# Patient Record
Sex: Female | Born: 1945
Health system: Southern US, Community
[De-identification: ages and names within clinical notes are randomized; demographics above are authoritative.]

## PROBLEM LIST (undated history)

## (undated) DIAGNOSIS — R058 Other specified cough: Secondary | ICD-10-CM

## (undated) DIAGNOSIS — T8859XA Other complications of anesthesia, initial encounter: Secondary | ICD-10-CM

## (undated) DIAGNOSIS — T4145XA Adverse effect of unspecified anesthetic, initial encounter: Secondary | ICD-10-CM

## (undated) DIAGNOSIS — N63 Unspecified lump in unspecified breast: Secondary | ICD-10-CM

## (undated) DIAGNOSIS — M1990 Unspecified osteoarthritis, unspecified site: Secondary | ICD-10-CM

## (undated) DIAGNOSIS — S060X9A Concussion with loss of consciousness of unspecified duration, initial encounter: Secondary | ICD-10-CM

## (undated) DIAGNOSIS — R05 Cough: Secondary | ICD-10-CM

## (undated) DIAGNOSIS — S060XAA Concussion with loss of consciousness status unknown, initial encounter: Secondary | ICD-10-CM

## (undated) DIAGNOSIS — K649 Unspecified hemorrhoids: Secondary | ICD-10-CM

## (undated) DIAGNOSIS — M810 Age-related osteoporosis without current pathological fracture: Secondary | ICD-10-CM

## (undated) HISTORY — PX: CARPAL TUNNEL RELEASE: SHX101

## (undated) HISTORY — PX: OTHER SURGICAL HISTORY: SHX169

## (undated) HISTORY — PX: KNEE ARTHROSCOPY: SUR90

## (undated) HISTORY — PX: ESOPHAGOGASTRODUODENOSCOPY: SHX1529

## (undated) HISTORY — DX: Age-related osteoporosis without current pathological fracture: M81.0

## (undated) HISTORY — DX: Concussion with loss of consciousness of unspecified duration, initial encounter: S06.0X9A

## (undated) HISTORY — PX: ABDOMINAL HYSTERECTOMY: SHX81

## (undated) HISTORY — PX: ABDOMINAL EXPLORATION SURGERY: SHX538

## (undated) HISTORY — DX: Unspecified lump in unspecified breast: N63.0

## (undated) HISTORY — DX: Concussion with loss of consciousness status unknown, initial encounter: S06.0XAA

## (undated) HISTORY — PX: ROTATOR CUFF REPAIR: SHX139

---

## 1987-02-20 HISTORY — PX: PARTIAL HYSTERECTOMY: SHX80

## 1987-02-20 HISTORY — PX: BREAST SURGERY: SHX581

## 1997-08-17 ENCOUNTER — Other Ambulatory Visit: Admission: RE | Admit: 1997-08-17 | Discharge: 1997-08-17 | Payer: Self-pay | Admitting: Obstetrics and Gynecology

## 1997-12-07 ENCOUNTER — Ambulatory Visit (HOSPITAL_COMMUNITY): Admission: RE | Admit: 1997-12-07 | Discharge: 1997-12-07 | Payer: Self-pay | Admitting: Obstetrics and Gynecology

## 1998-09-05 ENCOUNTER — Other Ambulatory Visit: Admission: RE | Admit: 1998-09-05 | Discharge: 1998-09-05 | Payer: Self-pay | Admitting: Obstetrics and Gynecology

## 1999-10-03 ENCOUNTER — Encounter: Payer: Self-pay | Admitting: Obstetrics and Gynecology

## 1999-10-03 ENCOUNTER — Other Ambulatory Visit: Admission: RE | Admit: 1999-10-03 | Discharge: 1999-10-03 | Payer: Self-pay | Admitting: Obstetrics and Gynecology

## 1999-10-03 ENCOUNTER — Encounter: Admission: RE | Admit: 1999-10-03 | Discharge: 1999-10-03 | Payer: Self-pay | Admitting: Obstetrics and Gynecology

## 2001-02-20 ENCOUNTER — Ambulatory Visit (HOSPITAL_COMMUNITY): Admission: RE | Admit: 2001-02-20 | Discharge: 2001-02-20 | Payer: Self-pay | Admitting: Obstetrics and Gynecology

## 2001-02-20 ENCOUNTER — Encounter: Payer: Self-pay | Admitting: Obstetrics and Gynecology

## 2001-04-09 ENCOUNTER — Observation Stay (HOSPITAL_COMMUNITY): Admission: RE | Admit: 2001-04-09 | Discharge: 2001-04-10 | Payer: Self-pay | Admitting: Orthopedic Surgery

## 2001-04-09 ENCOUNTER — Encounter: Payer: Self-pay | Admitting: Orthopedic Surgery

## 2001-05-20 DIAGNOSIS — N63 Unspecified lump in unspecified breast: Secondary | ICD-10-CM

## 2001-05-20 HISTORY — DX: Unspecified lump in unspecified breast: N63.0

## 2001-08-06 ENCOUNTER — Ambulatory Visit (HOSPITAL_COMMUNITY): Admission: RE | Admit: 2001-08-06 | Discharge: 2001-08-07 | Payer: Self-pay | Admitting: Orthopedic Surgery

## 2002-04-03 ENCOUNTER — Encounter: Payer: Self-pay | Admitting: Obstetrics and Gynecology

## 2002-04-03 ENCOUNTER — Encounter: Admission: RE | Admit: 2002-04-03 | Discharge: 2002-04-03 | Payer: Self-pay | Admitting: Obstetrics and Gynecology

## 2003-02-25 ENCOUNTER — Other Ambulatory Visit: Admission: RE | Admit: 2003-02-25 | Discharge: 2003-02-25 | Payer: Self-pay | Admitting: Internal Medicine

## 2003-06-04 ENCOUNTER — Encounter: Admission: RE | Admit: 2003-06-04 | Discharge: 2003-06-04 | Payer: Self-pay | Admitting: Family Medicine

## 2004-01-10 ENCOUNTER — Ambulatory Visit (HOSPITAL_COMMUNITY): Admission: RE | Admit: 2004-01-10 | Discharge: 2004-01-10 | Payer: Self-pay | Admitting: Gastroenterology

## 2004-04-07 ENCOUNTER — Ambulatory Visit: Payer: Self-pay | Admitting: Family Medicine

## 2004-05-24 ENCOUNTER — Ambulatory Visit: Payer: Self-pay | Admitting: Family Medicine

## 2004-06-27 ENCOUNTER — Ambulatory Visit (HOSPITAL_COMMUNITY): Admission: RE | Admit: 2004-06-27 | Discharge: 2004-06-27 | Payer: Self-pay | Admitting: Gastroenterology

## 2004-07-27 ENCOUNTER — Ambulatory Visit: Payer: Self-pay | Admitting: Family Medicine

## 2004-07-27 ENCOUNTER — Other Ambulatory Visit: Admission: RE | Admit: 2004-07-27 | Discharge: 2004-07-27 | Payer: Self-pay | Admitting: Family Medicine

## 2004-07-27 ENCOUNTER — Encounter (INDEPENDENT_AMBULATORY_CARE_PROVIDER_SITE_OTHER): Payer: Self-pay | Admitting: Internal Medicine

## 2004-07-27 LAB — CONVERTED CEMR LAB: Pap Smear: NORMAL

## 2004-08-09 ENCOUNTER — Encounter: Admission: RE | Admit: 2004-08-09 | Discharge: 2004-08-09 | Payer: Self-pay | Admitting: Family Medicine

## 2004-08-14 ENCOUNTER — Ambulatory Visit: Payer: Self-pay | Admitting: Family Medicine

## 2004-10-05 ENCOUNTER — Encounter (INDEPENDENT_AMBULATORY_CARE_PROVIDER_SITE_OTHER): Payer: Self-pay | Admitting: Specialist

## 2004-10-06 ENCOUNTER — Inpatient Hospital Stay (HOSPITAL_COMMUNITY): Admission: RE | Admit: 2004-10-06 | Discharge: 2004-10-09 | Payer: Self-pay | Admitting: General Surgery

## 2004-10-10 ENCOUNTER — Encounter: Admission: RE | Admit: 2004-10-10 | Discharge: 2004-10-10 | Payer: Self-pay | Admitting: General Surgery

## 2004-10-22 ENCOUNTER — Inpatient Hospital Stay (HOSPITAL_COMMUNITY): Admission: EM | Admit: 2004-10-22 | Discharge: 2004-10-28 | Payer: Self-pay | Admitting: Emergency Medicine

## 2004-11-14 ENCOUNTER — Encounter: Admission: RE | Admit: 2004-11-14 | Discharge: 2004-11-14 | Payer: Self-pay | Admitting: General Surgery

## 2004-11-27 ENCOUNTER — Ambulatory Visit: Payer: Self-pay | Admitting: Family Medicine

## 2004-12-18 ENCOUNTER — Ambulatory Visit: Payer: Self-pay | Admitting: Family Medicine

## 2004-12-22 ENCOUNTER — Ambulatory Visit: Payer: Self-pay | Admitting: Family Medicine

## 2005-07-31 ENCOUNTER — Ambulatory Visit: Payer: Self-pay | Admitting: Family Medicine

## 2005-08-06 ENCOUNTER — Ambulatory Visit: Payer: Self-pay | Admitting: Internal Medicine

## 2005-08-30 ENCOUNTER — Encounter: Admission: RE | Admit: 2005-08-30 | Discharge: 2005-08-30 | Payer: Self-pay | Admitting: Family Medicine

## 2005-11-05 ENCOUNTER — Ambulatory Visit: Payer: Self-pay | Admitting: Family Medicine

## 2005-12-10 ENCOUNTER — Ambulatory Visit: Payer: Self-pay | Admitting: Family Medicine

## 2005-12-26 ENCOUNTER — Ambulatory Visit: Payer: Self-pay | Admitting: Family Medicine

## 2006-04-17 ENCOUNTER — Ambulatory Visit: Payer: Self-pay | Admitting: Family Medicine

## 2006-07-17 ENCOUNTER — Ambulatory Visit: Payer: Self-pay | Admitting: Family Medicine

## 2006-07-17 DIAGNOSIS — J309 Allergic rhinitis, unspecified: Secondary | ICD-10-CM | POA: Insufficient documentation

## 2006-07-17 DIAGNOSIS — M25569 Pain in unspecified knee: Secondary | ICD-10-CM | POA: Insufficient documentation

## 2006-10-11 ENCOUNTER — Encounter: Admission: RE | Admit: 2006-10-11 | Discharge: 2006-10-11 | Payer: Self-pay | Admitting: Family Medicine

## 2006-10-15 ENCOUNTER — Encounter (INDEPENDENT_AMBULATORY_CARE_PROVIDER_SITE_OTHER): Payer: Self-pay | Admitting: *Deleted

## 2006-10-22 ENCOUNTER — Encounter (INDEPENDENT_AMBULATORY_CARE_PROVIDER_SITE_OTHER): Payer: Self-pay | Admitting: *Deleted

## 2006-10-25 ENCOUNTER — Ambulatory Visit: Payer: Self-pay | Admitting: Family Medicine

## 2006-10-25 ENCOUNTER — Telehealth (INDEPENDENT_AMBULATORY_CARE_PROVIDER_SITE_OTHER): Payer: Self-pay | Admitting: *Deleted

## 2006-10-29 ENCOUNTER — Telehealth (INDEPENDENT_AMBULATORY_CARE_PROVIDER_SITE_OTHER): Payer: Self-pay | Admitting: *Deleted

## 2006-11-07 ENCOUNTER — Encounter: Payer: Self-pay | Admitting: Family Medicine

## 2006-11-08 ENCOUNTER — Ambulatory Visit: Payer: Self-pay | Admitting: Family Medicine

## 2006-11-08 ENCOUNTER — Telehealth (INDEPENDENT_AMBULATORY_CARE_PROVIDER_SITE_OTHER): Payer: Self-pay | Admitting: *Deleted

## 2006-11-08 DIAGNOSIS — F411 Generalized anxiety disorder: Secondary | ICD-10-CM | POA: Insufficient documentation

## 2006-11-08 DIAGNOSIS — G4700 Insomnia, unspecified: Secondary | ICD-10-CM

## 2006-12-09 ENCOUNTER — Ambulatory Visit: Payer: Self-pay | Admitting: Family Medicine

## 2006-12-24 ENCOUNTER — Ambulatory Visit: Payer: Self-pay | Admitting: Family Medicine

## 2006-12-24 DIAGNOSIS — G43009 Migraine without aura, not intractable, without status migrainosus: Secondary | ICD-10-CM

## 2006-12-25 LAB — CONVERTED CEMR LAB
ALT: 30 units/L (ref 0–35)
AST: 35 units/L (ref 0–37)
Albumin: 3.9 g/dL (ref 3.5–5.2)
Alkaline Phosphatase: 96 units/L (ref 39–117)
Basophils Absolute: 0 10*3/uL (ref 0.0–0.1)
Chloride: 106 meq/L (ref 96–112)
Creatinine, Ser: 0.7 mg/dL (ref 0.4–1.2)
Direct LDL: 131 mg/dL
HCT: 39.1 % (ref 36.0–46.0)
MCHC: 35.6 g/dL (ref 30.0–36.0)
Neutrophils Relative %: 55.4 % (ref 43.0–77.0)
RBC: 4.37 M/uL (ref 3.87–5.11)
RDW: 12.3 % (ref 11.5–14.6)
Sodium: 140 meq/L (ref 135–145)
Total Bilirubin: 0.8 mg/dL (ref 0.3–1.2)
Total CHOL/HDL Ratio: 3.1
Triglycerides: 74 mg/dL (ref 0–149)
WBC: 4.6 10*3/uL (ref 4.5–10.5)

## 2006-12-29 ENCOUNTER — Encounter (INDEPENDENT_AMBULATORY_CARE_PROVIDER_SITE_OTHER): Payer: Self-pay | Admitting: Internal Medicine

## 2007-01-10 ENCOUNTER — Ambulatory Visit: Payer: Self-pay | Admitting: Internal Medicine

## 2007-01-10 ENCOUNTER — Encounter (INDEPENDENT_AMBULATORY_CARE_PROVIDER_SITE_OTHER): Payer: Self-pay | Admitting: Internal Medicine

## 2007-01-13 ENCOUNTER — Telehealth (INDEPENDENT_AMBULATORY_CARE_PROVIDER_SITE_OTHER): Payer: Self-pay | Admitting: *Deleted

## 2007-01-28 ENCOUNTER — Ambulatory Visit: Payer: Self-pay | Admitting: Family Medicine

## 2007-01-28 DIAGNOSIS — M810 Age-related osteoporosis without current pathological fracture: Secondary | ICD-10-CM

## 2007-01-29 ENCOUNTER — Encounter (INDEPENDENT_AMBULATORY_CARE_PROVIDER_SITE_OTHER): Payer: Self-pay | Admitting: Internal Medicine

## 2007-02-28 ENCOUNTER — Telehealth: Payer: Self-pay | Admitting: Internal Medicine

## 2007-02-28 ENCOUNTER — Encounter (INDEPENDENT_AMBULATORY_CARE_PROVIDER_SITE_OTHER): Payer: Self-pay | Admitting: *Deleted

## 2007-03-06 ENCOUNTER — Telehealth: Payer: Self-pay | Admitting: Family Medicine

## 2007-04-04 ENCOUNTER — Telehealth: Payer: Self-pay | Admitting: Family Medicine

## 2007-04-28 ENCOUNTER — Telehealth (INDEPENDENT_AMBULATORY_CARE_PROVIDER_SITE_OTHER): Payer: Self-pay | Admitting: Internal Medicine

## 2007-04-30 ENCOUNTER — Telehealth (INDEPENDENT_AMBULATORY_CARE_PROVIDER_SITE_OTHER): Payer: Self-pay | Admitting: Internal Medicine

## 2007-05-01 ENCOUNTER — Ambulatory Visit: Payer: Self-pay | Admitting: Family Medicine

## 2007-05-15 ENCOUNTER — Telehealth: Payer: Self-pay | Admitting: Family Medicine

## 2007-07-10 ENCOUNTER — Telehealth (INDEPENDENT_AMBULATORY_CARE_PROVIDER_SITE_OTHER): Payer: Self-pay | Admitting: Internal Medicine

## 2007-08-06 ENCOUNTER — Telehealth: Payer: Self-pay | Admitting: Family Medicine

## 2007-08-15 ENCOUNTER — Ambulatory Visit: Payer: Self-pay | Admitting: Family Medicine

## 2007-08-19 ENCOUNTER — Telehealth: Payer: Self-pay | Admitting: Family Medicine

## 2007-08-21 ENCOUNTER — Telehealth: Payer: Self-pay | Admitting: Family Medicine

## 2007-08-25 ENCOUNTER — Ambulatory Visit: Payer: Self-pay | Admitting: Family Medicine

## 2007-09-18 ENCOUNTER — Ambulatory Visit: Payer: Self-pay | Admitting: Family Medicine

## 2007-11-05 ENCOUNTER — Encounter: Admission: RE | Admit: 2007-11-05 | Discharge: 2007-11-05 | Payer: Self-pay | Admitting: Family Medicine

## 2007-11-05 ENCOUNTER — Ambulatory Visit: Payer: Self-pay | Admitting: Unknown Physician Specialty

## 2007-11-06 ENCOUNTER — Encounter (INDEPENDENT_AMBULATORY_CARE_PROVIDER_SITE_OTHER): Payer: Self-pay | Admitting: *Deleted

## 2007-12-18 ENCOUNTER — Ambulatory Visit: Payer: Self-pay | Admitting: Family Medicine

## 2007-12-18 ENCOUNTER — Other Ambulatory Visit: Admission: RE | Admit: 2007-12-18 | Discharge: 2007-12-18 | Payer: Self-pay | Admitting: Family Medicine

## 2007-12-18 ENCOUNTER — Encounter: Payer: Self-pay | Admitting: Family Medicine

## 2007-12-18 DIAGNOSIS — B351 Tinea unguium: Secondary | ICD-10-CM | POA: Insufficient documentation

## 2007-12-18 DIAGNOSIS — K921 Melena: Secondary | ICD-10-CM | POA: Insufficient documentation

## 2007-12-19 ENCOUNTER — Telehealth (INDEPENDENT_AMBULATORY_CARE_PROVIDER_SITE_OTHER): Payer: Self-pay | Admitting: Internal Medicine

## 2007-12-22 ENCOUNTER — Ambulatory Visit: Payer: Self-pay | Admitting: Gastroenterology

## 2007-12-22 DIAGNOSIS — R195 Other fecal abnormalities: Secondary | ICD-10-CM

## 2007-12-23 LAB — CONVERTED CEMR LAB
Basophils Absolute: 0 10*3/uL (ref 0.0–0.1)
Calcium: 9.4 mg/dL (ref 8.4–10.5)
Eosinophils Absolute: 0.2 10*3/uL (ref 0.0–0.7)
GFR calc Af Amer: 109 mL/min
HCT: 39.7 % (ref 36.0–46.0)
Hemoglobin: 13.8 g/dL (ref 12.0–15.0)
MCHC: 34.7 g/dL (ref 30.0–36.0)
MCV: 91.3 fL (ref 78.0–100.0)
Monocytes Absolute: 0.3 10*3/uL (ref 0.1–1.0)
Neutro Abs: 2.8 10*3/uL (ref 1.4–7.7)
RDW: 12.1 % (ref 11.5–14.6)
Sodium: 142 meq/L (ref 135–145)

## 2007-12-24 ENCOUNTER — Ambulatory Visit: Payer: Self-pay | Admitting: Gastroenterology

## 2007-12-31 ENCOUNTER — Telehealth (INDEPENDENT_AMBULATORY_CARE_PROVIDER_SITE_OTHER): Payer: Self-pay | Admitting: Internal Medicine

## 2008-01-01 ENCOUNTER — Ambulatory Visit: Payer: Self-pay | Admitting: Gastroenterology

## 2008-01-02 ENCOUNTER — Encounter (INDEPENDENT_AMBULATORY_CARE_PROVIDER_SITE_OTHER): Payer: Self-pay | Admitting: Internal Medicine

## 2008-01-02 DIAGNOSIS — J45909 Unspecified asthma, uncomplicated: Secondary | ICD-10-CM | POA: Insufficient documentation

## 2008-01-02 DIAGNOSIS — K219 Gastro-esophageal reflux disease without esophagitis: Secondary | ICD-10-CM | POA: Insufficient documentation

## 2008-01-02 DIAGNOSIS — E785 Hyperlipidemia, unspecified: Secondary | ICD-10-CM | POA: Insufficient documentation

## 2008-01-07 ENCOUNTER — Ambulatory Visit: Payer: Self-pay | Admitting: Gastroenterology

## 2008-01-07 DIAGNOSIS — K5730 Diverticulosis of large intestine without perforation or abscess without bleeding: Secondary | ICD-10-CM | POA: Insufficient documentation

## 2008-01-07 DIAGNOSIS — K649 Unspecified hemorrhoids: Secondary | ICD-10-CM | POA: Insufficient documentation

## 2008-01-27 ENCOUNTER — Encounter (INDEPENDENT_AMBULATORY_CARE_PROVIDER_SITE_OTHER): Payer: Self-pay | Admitting: Internal Medicine

## 2008-02-06 ENCOUNTER — Telehealth (INDEPENDENT_AMBULATORY_CARE_PROVIDER_SITE_OTHER): Payer: Self-pay | Admitting: Internal Medicine

## 2008-03-08 ENCOUNTER — Telehealth: Payer: Self-pay | Admitting: Family Medicine

## 2008-03-09 ENCOUNTER — Ambulatory Visit: Payer: Self-pay | Admitting: Family Medicine

## 2008-04-01 ENCOUNTER — Telehealth: Payer: Self-pay | Admitting: Family Medicine

## 2008-05-13 ENCOUNTER — Telehealth: Payer: Self-pay | Admitting: Family Medicine

## 2008-06-04 ENCOUNTER — Telehealth: Payer: Self-pay | Admitting: Family Medicine

## 2008-06-15 ENCOUNTER — Ambulatory Visit: Payer: Self-pay | Admitting: Family Medicine

## 2008-06-30 ENCOUNTER — Ambulatory Visit: Payer: Self-pay | Admitting: Family Medicine

## 2008-06-30 DIAGNOSIS — B002 Herpesviral gingivostomatitis and pharyngotonsillitis: Secondary | ICD-10-CM

## 2008-07-08 ENCOUNTER — Encounter: Payer: Self-pay | Admitting: Family Medicine

## 2008-07-26 ENCOUNTER — Telehealth: Payer: Self-pay | Admitting: Family Medicine

## 2008-08-10 ENCOUNTER — Telehealth: Payer: Self-pay | Admitting: Family Medicine

## 2008-08-26 ENCOUNTER — Telehealth: Payer: Self-pay | Admitting: Family Medicine

## 2008-11-10 ENCOUNTER — Telehealth: Payer: Self-pay | Admitting: Family Medicine

## 2008-11-16 ENCOUNTER — Encounter: Admission: RE | Admit: 2008-11-16 | Discharge: 2008-11-16 | Payer: Self-pay | Admitting: Family Medicine

## 2008-12-29 ENCOUNTER — Telehealth: Payer: Self-pay | Admitting: Family Medicine

## 2009-02-01 ENCOUNTER — Ambulatory Visit: Payer: Self-pay | Admitting: Family Medicine

## 2009-02-21 ENCOUNTER — Ambulatory Visit: Payer: Self-pay | Admitting: Family Medicine

## 2009-02-21 DIAGNOSIS — R05 Cough: Secondary | ICD-10-CM | POA: Insufficient documentation

## 2009-02-21 DIAGNOSIS — R110 Nausea: Secondary | ICD-10-CM

## 2009-02-22 ENCOUNTER — Telehealth (INDEPENDENT_AMBULATORY_CARE_PROVIDER_SITE_OTHER): Payer: Self-pay | Admitting: Internal Medicine

## 2009-03-11 ENCOUNTER — Ambulatory Visit: Payer: Self-pay | Admitting: Family Medicine

## 2009-03-11 ENCOUNTER — Telehealth: Payer: Self-pay | Admitting: Family Medicine

## 2009-03-11 DIAGNOSIS — R0789 Other chest pain: Secondary | ICD-10-CM | POA: Insufficient documentation

## 2009-03-11 DIAGNOSIS — R002 Palpitations: Secondary | ICD-10-CM | POA: Insufficient documentation

## 2009-03-15 ENCOUNTER — Telehealth: Payer: Self-pay | Admitting: Family Medicine

## 2009-04-06 ENCOUNTER — Encounter: Payer: Self-pay | Admitting: Family Medicine

## 2009-04-06 ENCOUNTER — Ambulatory Visit: Payer: Self-pay | Admitting: Family Medicine

## 2009-04-29 ENCOUNTER — Telehealth: Payer: Self-pay | Admitting: Family Medicine

## 2009-05-03 ENCOUNTER — Ambulatory Visit: Payer: Self-pay | Admitting: Family Medicine

## 2009-05-04 LAB — CONVERTED CEMR LAB: Vit D, 25-Hydroxy: 32 ng/mL (ref 30–89)

## 2009-06-03 ENCOUNTER — Telehealth: Payer: Self-pay | Admitting: Family Medicine

## 2009-07-12 ENCOUNTER — Telehealth: Payer: Self-pay | Admitting: Family Medicine

## 2009-08-15 ENCOUNTER — Telehealth: Payer: Self-pay | Admitting: Family Medicine

## 2009-09-15 ENCOUNTER — Telehealth: Payer: Self-pay | Admitting: Family Medicine

## 2009-09-22 ENCOUNTER — Encounter (INDEPENDENT_AMBULATORY_CARE_PROVIDER_SITE_OTHER): Payer: Self-pay | Admitting: *Deleted

## 2009-10-17 ENCOUNTER — Telehealth: Payer: Self-pay | Admitting: Family Medicine

## 2009-11-16 ENCOUNTER — Encounter: Payer: Self-pay | Admitting: Family Medicine

## 2009-12-02 ENCOUNTER — Telehealth: Payer: Self-pay | Admitting: Family Medicine

## 2009-12-21 ENCOUNTER — Encounter: Admission: RE | Admit: 2009-12-21 | Discharge: 2009-12-21 | Payer: Self-pay | Admitting: Family Medicine

## 2009-12-21 LAB — HM MAMMOGRAPHY

## 2010-01-03 ENCOUNTER — Ambulatory Visit: Payer: Self-pay | Admitting: Family Medicine

## 2010-01-04 ENCOUNTER — Telehealth (INDEPENDENT_AMBULATORY_CARE_PROVIDER_SITE_OTHER): Payer: Self-pay | Admitting: *Deleted

## 2010-01-26 ENCOUNTER — Telehealth: Payer: Self-pay | Admitting: Family Medicine

## 2010-02-02 ENCOUNTER — Ambulatory Visit: Payer: Self-pay | Admitting: Family Medicine

## 2010-02-03 LAB — CONVERTED CEMR LAB
BUN: 23 mg/dL (ref 6–23)
Chloride: 111 meq/L (ref 96–112)
Creatinine, Ser: 0.8 mg/dL (ref 0.4–1.2)
GFR calc non Af Amer: 78.85 mL/min (ref >60.00)
Glucose, Bld: 92 mg/dL (ref 70–99)

## 2010-02-21 ENCOUNTER — Telehealth: Payer: Self-pay | Admitting: Family Medicine

## 2010-02-22 ENCOUNTER — Telehealth: Payer: Self-pay | Admitting: Family Medicine

## 2010-03-01 ENCOUNTER — Ambulatory Visit
Admission: RE | Admit: 2010-03-01 | Discharge: 2010-03-01 | Payer: Self-pay | Source: Home / Self Care | Attending: Orthopedic Surgery | Admitting: Orthopedic Surgery

## 2010-03-06 LAB — POCT HEMOGLOBIN-HEMACUE: Hemoglobin: 15.3 g/dL — ABNORMAL HIGH (ref 12.0–15.0)

## 2010-03-21 NOTE — Assessment & Plan Note (Signed)
Summary: 3:15PRESSURE IN CHEST, SOB, HEART SKIP, COUGH PER DR Carvel Huskins/[place]   Vital Signs:  Patient profile:   65 year old female Height:      62.5 inches Weight:      165 pounds BMI:     29.81 Temp:     98.8 degrees F oral Pulse rate:   96 / minute Pulse rhythm:   regular BP sitting:   100 / 60  (left arm) Cuff size:   regular  Vitals Entered By: Benny Lennert CMA Duncan Dull) (March 11, 2009 3:33 PM)  History of Present Illness: Chief complaint pressure in chest  Seen 1/5 treated for bronchitis with avelox x 5 days.Marland KitchenCXR was negative.  Chest congestion improved.  1 week ago still did not feel 100%. Last night and This AM heart  beating irregularly, chest tightness. The chest tightness is with deep breaths, no change with exertion. Slight dizzyness. Continues to cough, no fever.  No Shortness of breath.   Did take allegra D 9 AM, but was having symtoms the night before.  No further nausea.  Palpitations age 68...no heart eval No stress test.  Hx of anxiety. No family hisotry of CAD.   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) 6)  Hemorrhoids  (ICD-455.6) 7)  Diverticulosis, Mild  (ICD-562.10) 8)  Hyperlipidemia  (ICD-272.4) 9)  Gerd  (ICD-530.81) 10)  Asthma  (ICD-493.90) 11)  Fecal Occult Blood  (ICD-792.1) 12)  Dermatophytosis of Nail  (ICD-110.1) 13)  Hemoccult Positive Stool  (ICD-578.1) 14)  Osteopenia  (ICD-733.90) 15)  Common Migraine  (ICD-346.10) 16)  Osteoporosis  (ICD-733.00) 17)  Well Adult  (ICD-V70.0) 18)  Anxiety State Nos  (ICD-300.00) 19)  Insomnia, Chronic  (ICD-307.42) 20)  Knee Pain, Right  (ICD-719.46) 21)  Allergic Rhinitis  (ICD-477.9)  Current Medications (verified): 1)  Evista 60 Mg Tabs (Raloxifene Hcl) .... Take 1 Tablet By Mouth Once A Day 2)  Zomig  Zmt 5 Mg  Tbdp (Zolmitriptan) .Marland Kitchen.. 1 At Onset of Headache, Can Repeat in 2 Hrs If Not Resolved 3)  Cosamin Ds 500-400 Mg  Tabs (Glucosamine-Chondroitin) .... 2 Tablet S Every Morning 4)  Tussionex Pennkinetic Er 8-10 Mg/58ml Lqcr (Chlorpheniramine-Hydrocodone) .... One Tsp By Mouth At Bedtime As Needed Fort Cough At Night, Sparingly. 5)  Tylenol Arthritis Pain 650 Mg Cr-Tabs (Acetaminophen) .... 2 Tabs By Mouth Two Times A Day 6)  Rhinocort Aqua 32 Mcg/act Susp (Budesonide) .... 2 Sprays Each Nostril Daily 7)  Allegra 180 Mg Tabs (Fexofenadine Hcl) .Marland Kitchen.. 1 Daily By Mouth As Needed 8)  Ibuprofen 200 Mg Tabs (Ibuprofen) .... As Needed  Allergies (verified): 1)  ! * Novacaine 2)  ! Biaxin 3)  ! * Zomig 4)  ! Codeine 5)  ! * Valacyclovir 6)  ! Advair Diskus (Fluticasone-Salmeterol) 7)  Amoxicillin (Amoxicillin) 8)  Erythromycin Ethylsuccinate 9)  Sulfa  Past History:  Past medical, surgical, family and social histories (including risk factors) reviewed, and no changes noted (except as noted below).  Past Medical History: Reviewed history from 01/02/2008 and no changes required. fundoplication Asthma GERD Hyperlipidemia Osteoporosis  Past Surgical History: Reviewed history from 01/27/2008 and no changes required. bone densometry 01/10/2007--osteopenia L/S spine and hip, DEXA 6/03, 7/50 partial hysterectomy--1989--dysplasia, ovaries left in fundoplication 2006 chronic cough Right breast mass- benign (05/2001) Rotator cuff repair- right (  03/2001), left (07/2001) Carpal tunnel release- right (03/2001), left (07/2001) EGD- normal (01/10/2004) Sinus CT- neg colonoscopy   01/07/08--monor diverticulosis, hemorrhoid  Family History: Reviewed history from 12/22/2007 and no changes required. no colon cancer in family  Review of Systems General:  Denies fatigue and fever. CV:  Complains of chest pain or discomfort. Resp:  Denies shortness of breath. GI:  Denies abdominal pain. GU:   Denies dysuria.  Physical Exam  General:  fatigued appearing feamle in NAD Head:  no maxillary sinus ttp Eyes:  Conjunctiva clear bilaterally.  Ears:  External ear exam shows no significant lesions or deformities.  Otoscopic examination reveals clear canals, tympanic membranes are intact bilaterally without bulging, retraction, inflammation or discharge. Hearing is grossly normal bilaterally. Nose:  nasal discharge, mucosal pallor.   Mouth:  MMM Neck:  no carotid bruit or thyromegaly no cervical or supraclavicular lymphadenopathy   Lungs:  Normal respiratory effort, chest expands symmetrically. Lungs are clear to auscultation, no crackles or wheezes. Heart:  Normal rate and regular rhythm. S1 and S2 normal without gallop, murmur, click, rub or other extra sounds. Pulses:  R and L posterior tibial pulses are full and equal bilaterally  Extremities:  no edema    Impression & Recommendations:  Problem # 1:  CHEST DISCOMFORT (ICD-786.59) Non exertional..more pleuriticNo clear respiratory distress...lung exam clear suggesting no PNA. Had neg CXR on 1/5 and s/p avelox x 5 days. Given recent illness and inactivity along with symptoms..doubt PE, but still on differential. No  sign of  ichemia, periocarditis, or PE on EKG  Recommended to pt to go to ER if any SOB on increasing pain. Will get d dimer to risk stratify for need for further eval of PE. Symptoms most liekly due to continued congestion and post inflammatory bronchospasm.  Close follow up on Monday.  Orders: T-D-Dimer Fibrin Derivatives Quantitive [phone]) Specimen Handling (09811) EKG w/ Interpretation (93000)  Problem # 2:  PALPITATIONS (ICD-785.1) ? due to recent medications, cough. EKG today NSR, no arrythmia.  Avoid caffine, decongestants, increase water.   Problem # 3:  COUGH (ICD-786.2) Most likely postinflammatory broncho spasm, PND...improved significantly with recent antibitoics.Marland Kitchenjust persisting mild cough. No SOB.     Complete Medication List: 1)  Evista 60 Mg Tabs (Raloxifene hcl) .... Take 1 tablet by mouth once a day 2)  Zomig Zmt 5 Mg Tbdp (Zolmitriptan) .Marland Kitchen.. 1 at onset of headache, can repeat in 2 hrs if not resolved 3)  Cosamin Ds 500-400 Mg Tabs (Glucosamine-chondroitin) .... 2 tablet s every morning 4)  Tussionex Pennkinetic Er 8-10 Mg/95ml Lqcr (Chlorpheniramine-hydrocodone) .... One tsp by mouth at bedtime as needed fort cough at night, sparingly. 5)  Tylenol Arthritis Pain 650 Mg Cr-tabs (Acetaminophen) .... 2 tabs by mouth two times a day 6)  Rhinocort Aqua 32 Mcg/act Susp (Budesonide) .... 2 sprays each nostril daily 7)  Allegra 180 Mg Tabs (Fexofenadine hcl) .Marland Kitchen.. 1 daily by mouth as needed 8)  Ibuprofen 200 Mg Tabs (Ibuprofen) .... As needed  Patient Instructions: 1)  Rest, push fluid. Stop Allegra. D 2)  Using mucinex for congestion. 3)  Will call with lab results.  4)   Go to ER if any SOB, chest pain increasing.  5)  Follow up appt early next week with Jevonte Clanton.Marland KitchenMarland KitchenTuesday...sooner if needed. Prescriptions: TUSSIONEX PENNKINETIC ER 8-10 MG/5ML LQCR (CHLORPHENIRAMINE-HYDROCODONE) one tsp by mouth at bedtime as needed fort cough at night, sparingly.  #8 oz x 0   Entered and Authorized by:   Kerby Nora  MD   Signed by:   Kerby Nora MD on 03/11/2009   Method used:   Print then Give to Patient   RxID:   [id number]   Current Allergies (reviewed today): ! * NOVACAINE ! BIAXIN ! * ZOMIG ! CODEINE ! * VALACYCLOVIR ! ADVAIR DISKUS (FLUTICASONE-SALMETEROL) AMOXICILLIN (AMOXICILLIN) ERYTHROMYCIN ETHYLSUCCINATE SULFA

## 2010-03-21 NOTE — Progress Notes (Signed)
Summary: Update on patient's migraines/DEXA scan  ---- Converted from flag ---- ---- 01/04/2010 1:44 PM, Crawford Givens MD wrote: Pt has been on evista since 10/15/2001.  I would continue the med for now and recheck DXA in late 2013.  please notify her. Have her update me on her migraines after she has been on the topamax for a few weeks, sooner as needed.  thanks. ------------------------------ Called and left message on machine at home for patient to return call.  Linde Gillis CMA Duncan Dull) January 04, 2010 2:14PM Patient Advised. Delilah Shan CMA Duncan Dull)  January 05, 2010 10:58 AM   Call placed by: Linde Gillis CMA Duncan Dull),  January 04, 2010 2:16 PM

## 2010-03-21 NOTE — Progress Notes (Signed)
Summary: refill request for tussionex  Phone Note Refill Request Call back at Home Phone [phone] Call back at [phone] Message from:  Patient  Refills Requested: Medication #1:  Alice Smith ER 8-10 MG/5ML LQCR one tsp by mouth at bedtime as needed for cough at night Pt has always gotten monthly refills from Dr. Hetty Ely.  She has managed to spread out her last refill until now.  She says if she doesnt take this she will have terrrible couging spells and will not sleep at night- says she only takes this at night.  [facility]  Initial call taken by: Lowella Petties CMA,  October 17, 2009 11:23 AM  Follow-up for Phone Call        please call in.  [facility] ER 8-10 MG/5ML LQCR (CHLORPHENIRAMINE-HYDROCODONE) one tsp by mouth at bedtime as needed for cough at night, sparingly. 8oz, no rf.  Follow-up by: Crawford Givens MD,  October 17, 2009 1:50 PM  Additional Follow-up for Phone Call Additional follow up Details #1::        Medication phoned to pharmacy.  Additional Follow-up by: Delilah Shan CMA Braison Snoke Dull),  October 17, 2009 2:49 PM    Prescriptions: Alice Smith ER 8-10 MG/5ML LQCR (CHLORPHENIRAMINE-HYDROCODONE) one tsp by mouth at bedtime as needed for cough at night, sparingly.  #8 oz x 0   Entered by:   Delilah Shan CMA (AAMA)   Authorized by:   Crawford Givens MD   Signed by:   Delilah Shan CMA (AAMA) on 10/17/2009   Method used:   Telephoned to ...       [facility] [facility]ail)       [address]       [place], [place]  [address]       Ph: [phone] or [phone]       Fax: [phone]   RxID:   [id number]

## 2010-03-21 NOTE — Letter (Signed)
[facility]   Imported By: Lanelle Bal 11/23/2009 11:14:11  _____________________________________________________________________  External Attachment:    Type:   Image     Comment:   External Document  Appended Document: [facility]    Clinical Lists Changes  Observations: Added new observation of PAST MED HX: fundoplication Asthma GERD Hyperlipidemia Osteoporosis migraine- HA eval with Dr. Neale Burly 11/2009 (11/23/2009 22:26)       Past History:  Past Medical History: fundoplication Asthma GERD Hyperlipidemia Osteoporosis migraine- HA eval with Dr. Neale Burly 11/2009

## 2010-03-21 NOTE — Miscellaneous (Signed)
Summary: BONE DENSITY  Clinical Lists Changes  Orders: Added new Test order of T-Bone Densitometry (77080) - Signed Added new Test order of T-Lumbar Vertebral Assessment (77082) - Signed 

## 2010-03-21 NOTE — Progress Notes (Signed)
Summary: refill request for zomig  Phone Note Refill Request Message from:  Fax from Pharmacy  Refills Requested: Medication #1:  ZOMIG ZMT 5 MG  TBDP 1 at onset of headache [facility]ed request from [facility] [address], no last filled date given.  Initial call taken by: Lowella Petties CMA,  September 15, 2009 2:34 PM  Follow-up for Phone Call        sent. notify patient.  Follow-up by: Crawford Givens MD,  September 15, 2009 2:44 PM  Additional Follow-up for Phone Call Additional follow up Details #1::        Patient Advised.  Additional Follow-up by: Delilah Shan CMA (AAMA),  September 15, 2009 3:04 PM    Prescriptions: ZOMIG ZMT 5 MG  TBDP (ZOLMITRIPTAN) 1 at onset of headache, can repeat in 2 hrs if not resolved  #12 Tablet x 1   Entered and Authorized by:   Crawford Givens MD   Signed by:   Crawford Givens MD on 09/15/2009   Method used:   Electronically to        [facility] (retail)       [address]       [place], [place]  [address]       Ph: [phone] or [phone]       Fax: [phone]   RxID:   [id number]

## 2010-03-21 NOTE — Progress Notes (Signed)
Summary: tussionex   Phone Note Refill Request Call back at Home Phone [phone] Message from:  Patient on August 15, 2009 2:41 PM  Refills Requested: Medication #1:  Sandria Senter ER 8-10 MG/5ML LQCR one tsp by mouth at bedtime as needed fort cough at [facility]  Initial call taken by: Melody Comas,  August 15, 2009 2:41 PM  Follow-up for Phone Call        Rx called to pharmacy. Patient notified. Follow-up by: Sydell Axon LPN,  August 15, 2009 4:42 PM    New/Updated Medications: Sandria Senter ER 8-10 MG/5ML LQCR (CHLORPHENIRAMINE-HYDROCODONE) one tsp by mouth at bedtime as needed for cough at night, sparingly. Prescriptions: TUSSIONEX PENNKINETIC ER 8-10 MG/5ML LQCR (CHLORPHENIRAMINE-HYDROCODONE) one tsp by mouth at bedtime as needed for cough at night, sparingly.  #8 oz x 0   Entered and Authorized by:   Shaune Leeks MD   Signed by:   Shaune Leeks MD on 08/15/2009   Method used:   Telephoned to ...       [facility] (retail)       [address]       [place], [place]  [address]       Ph: [phone] or [phone]       Fax: [phone]   RxID:   [id number]

## 2010-03-21 NOTE — Letter (Signed)
Summary: Nadara Eaton letter  [place] [facility]  [address] [place], [place] [address]   Phone: [phone]  Fax: [phone]       09/22/2009 MRN: [id number]  MERLEAN PIZZINI [address] [place], [place]  [address]  Dear Ms. [facility] [place] and [place] announce the retirement of Arta Silence, M.D., from full-time practice at the [facility] office effective August 18, 2009 and his plans of returning part-time.  It is important to Dr. Hetty Ely and to our practice that you understand that [facility] has seven physicians in our office for your health care needs.  We will continue to offer the same exceptional care that you have today.    Dr. Hetty Ely has spoken to many of you about his plans for retirement and returning part-time in the fall.   We will continue to work with you through the transition to schedule appointments for you in the office and meet the high standards that [place] is committed to.   Again, it is with great pleasure that we share the news that Dr. Hetty Ely will return to [facility] in October of 2011 with a reduced schedule.    If you have any questions, or would like to request an appointment with one of our physicians, please call us at [phone] and press the option for Scheduling an appointment.  We take pleasure in providing you with excellent patient care and look forward to seeing you at your next office visit.  Our [facility] Physicians are:  Tillman Abide, M.D. Laurita Quint, M.D. Roxy Manns, M.D. Kerby Nora, M.D. Hannah Beat, M.D. Ruthe Mannan, M.D. We proudly welcomed Raechel Ache, M.D. and Eustaquio Boyden, M.D. to the practice in July/August 2011.  Sincerely,  [facility]

## 2010-03-21 NOTE — Assessment & Plan Note (Signed)
Summary: CONGESTION/DLO   Vital Signs:  Patient profile:   65 year old female Height:      62.5 inches Weight:      165.2 pounds BMI:     29.84 Temp:     99.0 degrees F oral Pulse rate:   96 / minute Pulse rhythm:   regular BP sitting:   110 / 70  (left arm) Cuff size:   regular  Vitals Entered By: Benny Lennert CMA Duncan Dull) (February 21, 2009 3:25 PM)  History of Present Illness: Chief complaint congestion   Acute Visit History:      The patient complains of abdominal pain, cough, fever, nasal discharge, and sinus problems.  These symptoms began 2 weeks ago.  She denies earache, genitourinary symptoms, and headache.  Other comments include: fatigue, nausea Copius post nasal srip.  Has been exposed to flu.        Her highest temperature has been subjective.        The character of the cough is described as nonproductive.  There is no history of wheezing or shortness of breath associated with her cough.        She complains of sinus pressure, ears being blocked, and nasal congestion.        Comments: central.        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) 4)  Hemorrhoids  (ICD-455.6) 5)  Diverticulosis, Mild  (ICD-562.10) 6)  Hyperlipidemia  (ICD-272.4) 7)  Gerd  (ICD-530.81) 8)  Asthma  (ICD-493.90) 9)  Fecal Occult Blood  (ICD-792.1) 10)  Dermatophytosis of Nail  (ICD-110.1) 11)  Hemoccult Positive Stool  (ICD-578.1) 12)  Osteopenia  (ICD-733.90) 13)  Common Migraine  (ICD-346.10) 14)  Osteoporosis  (ICD-733.00) 15)  Well Adult  (ICD-V70.0) 16)  Anxiety State Nos  (ICD-300.00) 17)  Insomnia, Chronic  (ICD-307.42) 18)  Knee Pain, Right  (ICD-719.46) 19)  Allergic Rhinitis  (ICD-477.9)  Current Medications (verified): 1)  Evista 60 Mg Tabs  (Raloxifene Hcl) .... Take 1 Tablet By Mouth Once A Day 2)  Zomig Zmt 5 Mg  Tbdp (Zolmitriptan) .Marland Kitchen.. 1 At Onset of Headache, Can Repeat in 2 Hrs If Not Resolved 3)  Cosamin Ds 500-400 Mg  Tabs (Glucosamine-Chondroitin) .... 2 Tablet S Every Morning 4)  Tussionex Pennkinetic Er 8-10 Mg/57ml Lqcr (Chlorpheniramine-Hydrocodone) .... One Tsp By Mouth At Bedtime As Needed Fort Cough At Night, Sparingly. 5)  Tylenol Arthritis Pain 650 Mg Cr-Tabs (Acetaminophen) .... 2 Tabs By Mouth Two Times A Day 6)  Rhinocort Aqua 32 Mcg/act Susp (Budesonide) .... 2 Sprays Each Nostril Daily 7)  Allegra 180 Mg Tabs (Fexofenadine Hcl) .Marland Kitchen.. 1 Daily By Mouth As Needed 8)  Ibuprofen 200 Mg Tabs (Ibuprofen) .... As Needed 9)  Avelox 400 Mg Tabs (Moxifloxacin Hcl) .Marland Kitchen.. 1 Tab By Mouth Daily X 5 Days 10)  Promethazine Hcl 25 Mg Tabs (Promethazine Hcl) .... 1/2 To 1 Tab By Mouth Q6 Hours As Needed For Nausea  Allergies: 1)  ! * Novacaine 2)  ! Biaxin 3)  ! * Zomig 4)  ! Codeine 5)  ! * Valacyclovir 6)  ! Advair Diskus (Fluticasone-Salmeterol) 7)  Amoxicillin (Amoxicillin) 8)  Erythromycin Ethylsuccinate 9)  Sulfa  Past History:  Past medical, surgical, family and social histories (including risk factors) reviewed,  and no changes noted (except as noted below).  Past Medical History: Reviewed history from 01/02/2008 and no changes required. fundoplication Asthma GERD Hyperlipidemia Osteoporosis  Past Surgical History: Reviewed history from 01/27/2008 and no changes required. bone densometry 01/10/2007--osteopenia L/S spine and hip, DEXA 6/03, 7/50 partial hysterectomy--1989--dysplasia, ovaries left in fundoplication 2006 chronic cough Right breast mass- benign (05/2001) Rotator cuff repair- right (03/2001), left (07/2001) Carpal tunnel release- right (03/2001), left (07/2001) EGD- normal (01/10/2004) Sinus CT- neg colonoscopy   01/07/08--monor diverticulosis, hemorrhoid  Family History: Reviewed history  from 12/22/2007 and no changes required. no colon cancer in family  Social History: Reviewed history from 12/22/2007 and no changes required. Marital Status: Married Children:3 --adult--has adopted 4, to have new grand child soon--has 1 Occupation: at home  Former Smoker 10 pack year history  Review of Systems General:  Complains of fatigue. Eyes:  Denies eye pain. ENT:  Denies ringing in ears. CV:  Denies chest pain or discomfort and swelling of feet. GI:  Complains of constipation; denies abdominal pain and diarrhea; but had nml BM this AM. GU:  Denies dysuria.  Physical Exam  General:  fatigued appearing feamle in NAD Head:  no maxillar sinus ttp Nose:  nasal dischargemucosal pallor.   Mouth:  MMM Neck:  no carotid bruit or thyromegaly .nnodes  Lungs:  rhonichiin left lung, cleared some with cough, no rales Heart:  Normal rate and regular rhythm. S1 and S2 normal without gallop, murmur, click, rub or other extra sounds.   Impression & Recommendations:  Problem # 1:  COUGH (ICD-786.2) Likely bronchitis vs influenza, but lung exam and SOB concerning for pneumonia. Send for CXR ASAP.  No hypoxia.  Will treat with antibiotics avelox 5 days...can extend course  if CXR positive. Treat cough with suppressant.. start mucinex for mucolytic.  Orders: Radiology Referral (Radiology)  Problem # 2:  NAUSEA (ICD-787.02) ? due to PND. Use phenergan as needed. No suggestion of UTI etc.  Her updated medication list for this problem includes:    Allegra 180 Mg Tabs (Fexofenadine hcl) .Marland Kitchen... 1 daily by mouth as needed    Promethazine Hcl 25 Mg Tabs (Promethazine hcl) .Marland Kitchen... 1/2 to 1 tab by mouth q6 hours as needed for nausea  Complete Medication List: 1)  Evista 60 Mg Tabs (Raloxifene hcl) .... Take 1 tablet by mouth once a day 2)  Zomig Zmt 5 Mg Tbdp (Zolmitriptan) .Marland Kitchen.. 1 at onset of headache, can repeat in 2 hrs if not resolved 3)  Cosamin Ds 500-400 Mg Tabs (Glucosamine-chondroitin)  .... 2 tablet s every morning 4)  Tussionex Pennkinetic Er 8-10 Mg/67ml Lqcr (Chlorpheniramine-hydrocodone) .... One tsp by mouth at bedtime as needed fort cough at night, sparingly. 5)  Tylenol Arthritis Pain 650 Mg Cr-tabs (Acetaminophen) .... 2 tabs by mouth two times a day 6)  Rhinocort Aqua 32 Mcg/act Susp (Budesonide) .... 2 sprays each nostril daily 7)  Allegra 180 Mg Tabs (Fexofenadine hcl) .Marland Kitchen.. 1 daily by mouth as needed 8)  Ibuprofen 200 Mg Tabs (Ibuprofen) .... As needed 9)  Avelox 400 Mg Tabs (Moxifloxacin hcl) .Marland Kitchen.. 1 tab by mouth daily x 5 days 10)  Promethazine Hcl 25 Mg Tabs (Promethazine hcl) .... 1/2 to 1 tab by mouth q6 hours as needed for nausea  Patient Instructions: 1)  Referral Appointment Information 2)  Day/Date: 3)  Time: 4)  Place/MD: 5)  Address: 6)  Phone/Fax: 7)  Patient given appointment information. Information/Orders faxed/mailed. 8)  Call if not improving in 48-72 hpurs.Marland Kitchengo  to ER if SOB. 9)  Neurology appt with Dr Clarisse Gouge on 02/28/2009.... ask Gae about phone number for their office. Prescriptions: PROMETHAZINE HCL 25 MG TABS (PROMETHAZINE HCL) 1/2 to 1 tab by mouth q6 hours as needed for nausea  #20 x 0   Entered and Authorized by:   Kerby Nora MD   Signed by:   Kerby Nora MD on 02/21/2009   Method used:   [facility]        [id number]       [address]       [place], [place]  [address]       Ph: [phone] or [phone]       Fax: [phone]   RxID:   [id number] AVELOX 400 MG TABS (MOXIFLOXACIN HCL) 1 tab by mouth daily x 5 days  #5 x 0   Entered and Authorized by:   Kerby Nora MD   Signed by:   Kerby Nora MD on 02/21/2009   Method used:   [facility]        [facility] (retail)       [address]       [place], [place]  [address]       Ph: [phone] or [phone]       Fax: [phone]   RxID:   [id number]   Current Allergies (reviewed today): ! *  NOVACAINE ! BIAXIN ! * ZOMIG ! CODEINE ! * VALACYCLOVIR ! ADVAIR DISKUS (FLUTICASONE-SALMETEROL) AMOXICILLIN (AMOXICILLIN) ERYTHROMYCIN ETHYLSUCCINATE SULFA  Appended Document: CONGESTION/DLO Notify pt call report of CXR was clear..no pneumonia or other..take antibiotic for brponchitis as directed.   Appended Document: CONGESTION/DLO patients husband advised.Consuello Masse CMA

## 2010-03-21 NOTE — Assessment & Plan Note (Signed)
Summary: REFILL MED/DR [facility] PT/CLE   Vital Signs:  Patient profile:   65 year old female Height:      62.5 inches Weight:      171.25 pounds BMI:     30.93 Temp:     98.8 degrees F oral Pulse rate:   88 / minute Pulse rhythm:   regular BP sitting:   132 / 80  (left arm) Cuff size:   regular  Vitals Entered By: Delilah Shan CMA Duncan Dull) (January 03, 2010 3:20 PM) CC: Refill medication, Preventive Care   History of Present Illness: Osteopenia:  last dxa was last year, h/o osteopenia,  Has been on evista since 09/2001.  Is s/p hysterectomy.    cough- using tussionex.  This is the only thing that helps the patient.  Had allergy shots at allergy clinic.  H/o nissen fundoplication.  Prev with extensive work up including EGD.  No sputum.  Needs refill.   HA- Posterior occiput pain.  Waking with pain.  Can happen later in the day.  H/o vision change with HA, none recently.  zomig helps.  Usually with 50% of days or greater.  Photo and phonophobia.  Dry heaves.  Hasn't started on suppressive tx yet.     H/o knee pain. L knee, prev improved with exercises to strengthen quad.  Asking for advice.  Prev medial meniscus findings.   Current Medications (verified): 1)  Evista 60 Mg Tabs (Raloxifene Hcl) .... Take 1 Tablet By Mouth Once A Day 2)  Zomig Zmt 5 Mg  Tbdp (Zolmitriptan) .Marland Kitchen.. 1 At Onset of Headache, Can Repeat in 2 Hrs If Not Resolved 3)  Cosamin Ds 500-400 Mg  Tabs (Glucosamine-Chondroitin) .... 2 Tablet S Every Morning 4)  Tussionex Pennkinetic Er 8-10 Mg/39ml Lqcr (Chlorpheniramine-Hydrocodone) .... One Tsp By Mouth At Bedtime As Needed For Cough At Night, Sparingly. 5)  Allegra 180 Mg Tabs (Fexofenadine Hcl) .Marland Kitchen.. 1 Daily By Mouth As Needed 6)  Ibuprofen 200 Mg Tabs (Ibuprofen) .... As Needed 7)  Vitamin D 1000 Unit Tabs (Cholecalciferol) .... Take One By Mouth Daily 8)  Triamcinolone Nasal Spray .... One Spray Each Nostril Once Daily 9)  Vitamin C 500 Mg  Tabs (Ascorbic Acid)  .... Once Daily 10)  Vitamin B Complex-C   Caps (B Complex-C) .... Once Daily 11)  Cvs Biotin 5000 Mcg Caps (Biotin) .... Take 1 Capsule By Mouth Once A Day 12)  Calcium Carbonate-Vitamin D 600-400 Mg-Unit  Tabs (Calcium Carbonate-Vitamin D) .... Take 1 or 2 Once Daily 13)  Fish Oil   Oil (Fish Oil) .... 1,000 Mg. Once Daily  Allergies: 1)  ! * Novacaine 2)  ! Biaxin 3)  ! * Zomig 4)  ! Codeine 5)  ! * Valacyclovir 6)  ! Advair Diskus (Fluticasone-Salmeterol) 7)  Amoxicillin (Amoxicillin) 8)  Erythromycin Ethylsuccinate 9)  Sulfa  Past History:  Family History: Last updated: 01/03/2010 no colon cancer in family M alive, h/o frequent cough, OA F dead, cancer- throat sister with breast CA GM died with breast CA  Social History: Last updated: 01/03/2010 Marital Status: Married, 1980 (second marriage) Children:3 --adult--has adopted 4, 2 grandkids Occupation: at home Former Smoker 10 pack year history alcohol: none From [place] enjoys time with family, has a treadmill but hasn't used it much  Past Medical History: Asthma GERD s/p fundoplication Hyperlipidemia Osteoporosis, last DXA 2010 migraine- HA eval with Dr. Neale Burly 11/2009  Past Surgical History: bone densometry 01/10/2007--osteopenia L/S spine and hip, DEXA 6/03, 7/50  hysterectomy--1989--dysplasia, ovaries left in fundoplication 2006 chronic cough Right breast mass- benign (05/2001) Rotator cuff repair- right (03/2001), left (07/2001) Carpal tunnel release- right (03/2001), left (07/2001) EGD- normal (01/10/2004) Sinus CT- neg colonoscopy   01/07/08--monor diverticulosis, hemorrhoid  Family History: no colon cancer in family M alive, h/o frequent cough, OA F dead, cancer- throat sister with breast CA GM died with breast CA  Social History: Marital Status: Married, 1980 (second marriage) Children:3 --adult--has adopted 4, 2 grandkids Occupation: at home Former Smoker 10 pack year history alcohol:  none From [place] enjoys time with family, has a treadmill but hasn't used it much  Review of Systems       See HPI.  Otherwise negative.    Physical Exam  General:  GEN: nad, alert and oriented HEENT: mucous membranes moist NECK: supple w/o LA CV: rrr.  no murmur PULM: ctab, no inc wob ABD: soft, +bs EXT: no edema SKIN: no acute rash  CN 2-12 wnl B, S/S/DTR wnl x4  L knee w/o effusion but medial meniscal findings noted on exam- she is tender medially with testing.  able to bear weight.    Impression & Recommendations:  Problem # 1:  COUGH (ICD-786.2) cont tussionex.   Problem # 2:  COMMON MIGRAINE (ICD-346.10) d/w patient about using topamax and gradually increasing the dose.  She is aware of possible tingling in the hands.  follow up as needed and call back with update as needed.  She agrees.  The following medications were removed from the medication list:    Tylenol Arthritis Pain 650 Mg Cr-tabs (Acetaminophen) .Marland Kitchen... 2 tabs by mouth two times a day Her updated medication list for this problem includes:    Zomig Zmt 5 Mg Tbdp (Zolmitriptan) .Marland Kitchen... 1 at onset of headache, can repeat in 2 hrs if not resolved    Ibuprofen 200 Mg Tabs (Ibuprofen) .Marland Kitchen... As needed  Problem # 3:  OSTEOPENIA (ICD-733.90) I would continue meds for now and recheck DXA in 2012 or 2013.   Her updated medication list for this problem includes:    Evista 60 Mg Tabs (Raloxifene hcl) .Marland Kitchen... Take 1 tablet by mouth once a day    Vitamin D 1000 Unit Tabs (Cholecalciferol) .Marland Kitchen... Take one by mouth daily    Calcium Carbonate-vitamin D 600-400 Mg-unit Tabs (Calcium carbonate-vitamin d) .Marland Kitchen... Take 1 or 2 once daily  Complete Medication List: 1)  Evista 60 Mg Tabs (Raloxifene hcl) .... Take 1 tablet by mouth once a day 2)  Zomig Zmt 5 Mg Tbdp (Zolmitriptan) .Marland Kitchen.. 1 at onset of headache, can repeat in 2 hrs if not resolved 3)  Cosamin Ds 500-400 Mg Tabs (Glucosamine-chondroitin) .... 2 tablet s every  morning 4)  Tussionex Pennkinetic Er 8-10 Mg/28ml Lqcr (Chlorpheniramine-hydrocodone) .... One tsp by mouth at bedtime as needed for cough at night, sparingly. 5)  Allegra 180 Mg Tabs (Fexofenadine hcl) .Marland Kitchen.. 1 daily by mouth as needed 6)  Ibuprofen 200 Mg Tabs (Ibuprofen) .... As needed 7)  Vitamin D 1000 Unit Tabs (Cholecalciferol) .... Take one by mouth daily 8)  Triamcinolone Nasal Spray  .... One spray each nostril once daily 9)  Vitamin C 500 Mg Tabs (Ascorbic acid) .... Once daily 10)  Vitamin B Complex-c Caps (B complex-c) .... Once daily 11)  Cvs Biotin 5000 Mcg Caps (Biotin) .... Take 1 capsule by mouth once a day 12)  Calcium Carbonate-vitamin D 600-400 Mg-unit Tabs (Calcium carbonate-vitamin d) .... Take 1 or 2 once daily 13)  Fish Oil Oil (Fish oil) .... 1,000 mg. once daily 14)  Topamax 25 Mg Tabs (Topiramate) .Marland Kitchen.. 1 by mouth at bedtime, increase by 1 tab per week up to 4 at night.  PAP Screening:    Last PAP smear:  12/18/2007  Mammogram Screening:    Last Mammogram:  12/21/2009  Osteoporosis Risk Assessment:  Risk Factors for Fracture or Low Bone Density:   Race (White or Asian):     yes   Smoking status:       quit  Bone Density (DEXA Scan) Results:  Next Bone Density Due:    12/21/2011  Immunization & Chemoprophylaxis:    Tetanus vaccine: Td  (02/26/2003)    Influenza vaccine: Fluvax 3+  (12/18/2007)    Pneumovax: Pneumovax  (11/27/2004)  Patient Instructions: 1)  I'll contact you with more information.  Let me know how you do on the topamax.  Gradually increase the dose (by 1 pill per week).  Use the tussionex as needed.  Take care.  Prescriptions: TUSSIONEX PENNKINETIC ER 8-10 MG/5ML LQCR (CHLORPHENIRAMINE-HYDROCODONE) one tsp by mouth at bedtime as needed for cough at night, sparingly.  #8 oz x 0   Entered and Authorized by:   Crawford Givens MD   Signed by:   Crawford Givens MD on 01/03/2010   Method used:   Print then Give to Patient   RxID:    [id number] TOPAMAX 25 MG TABS (TOPIRAMATE) 1 by mouth at bedtime, increase by 1 tab per week up to 4 at night.  #100 x 1   Entered and Authorized by:   Crawford Givens MD   Signed by:   Crawford Givens MD on 01/03/2010   Method used:   Print then Give to Patient   RxID:   [id number]    Orders Added: 1)  Est. Patient Level IV [41324]      Appended Document: REFILL MED/DR [facility] PT/CLE    Clinical Lists Changes  Observations: Added new observation of FLU VAX: Historical (11/19/2009 13:45)       Immunization History:  Influenza Immunization History:    Influenza:  historical (11/19/2009)

## 2010-03-21 NOTE — Progress Notes (Signed)
Summary: tussionex  Phone Note Refill Request Call back at Home Phone [phone] Message from:  Patient on Jul 12, 2009 3:10 PM  Refills Requested: Medication #1:  Sandria Senter ER 8-10 MG/5ML LQCR one tsp by mouth at bedtime as needed fort cough at night [facility]  Initial call taken by: Melody Comas,  Jul 12, 2009 3:10 PM  Follow-up for Phone Call        [facility] Follow-up by: Lowella Petties CMA,  Jul 13, 2009 9:05 AM    Prescriptions: Sandria Senter ER 8-10 MG/5ML LQCR (CHLORPHENIRAMINE-HYDROCODONE) one tsp by mouth at bedtime as needed fort cough at night, sparingly.  #8 oz x 0   Entered and Authorized by:   Shaune Leeks MD   Signed by:   Shaune Leeks MD on 07/12/2009   Method used:   Telephoned to ...       [facility] (retail)       [address]       [place], [place]  [address]       Ph: [phone] or [phone]       Fax: [phone]   RxID:   [id number]

## 2010-03-21 NOTE — Assessment & Plan Note (Signed)
Summary: discuss bone density results   Vital Signs:  Patient profile:   65 year old female Weight:      167.50 pounds Temp:     98.8 degrees F oral Pulse rate:   100 / minute Pulse rhythm:   regular BP sitting:   140 / 82  (left arm) Cuff size:   regular  Vitals Entered By: Sydell Axon LPN (May 03, 2009 3:53 PM) CC: Discuss results of bone density test   History of Present Illness: Pt here to discuss DEXA results. She feels well and has been on Evista for a few years now. She recently  saw a show on Dr Neil Crouch dealing with Vit D and stopped her Ca because it had Vit D in it in order to take Vit D OTC. (he must have warned about taking too much) She otherwise has right knee pain and has recently gotten a treadmill which she is motivated to start using.   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) 8)  Hemorrhoids  (ICD-455.6) 9)  Diverticulosis, Mild  (ICD-562.10) 10)  Hyperlipidemia  (ICD-272.4) 11)  Gerd  (ICD-530.81) 12)  Asthma  (ICD-493.90) 13)  Fecal Occult Blood  (ICD-792.1) 14)  Dermatophytosis of Nail  (ICD-110.1) 15)  Hemoccult Positive Stool  (ICD-578.1) 16)  Osteopenia  (ICD-733.90) 17)  Common Migraine  (ICD-346.10) 18)  Osteoporosis  (ICD-733.00) 19)  Well Adult  (ICD-V70.0) 20)  Anxiety State Nos  (ICD-300.00) 21)  Insomnia, Chronic  (ICD-307.42) 22)  Knee Pain, Right  (ICD-719.46) 23)  Allergic Rhinitis  (ICD-477.9)  Medications Prior to Update: 1)  Evista 60 Mg Tabs (Raloxifene Hcl) .... Take 1 Tablet By Mouth Once A Day 2)  Zomig Zmt 5 Mg  Tbdp (Zolmitriptan) .Marland Kitchen.. 1 At Onset of Headache, Can Repeat in 2 Hrs If Not Resolved 3)  Cosamin Ds 500-400 Mg  Tabs (Glucosamine-Chondroitin) .... 2 Tablet S Every Morning 4)   Tussionex Pennkinetic Er 8-10 Mg/3ml Lqcr (Chlorpheniramine-Hydrocodone) .... One Tsp By Mouth At Bedtime As Needed Fort Cough At Night, Sparingly. 5)  Tylenol Arthritis Pain 650 Mg Cr-Tabs (Acetaminophen) .... 2 Tabs By Mouth Two Times A Day 6)  Rhinocort Aqua 32 Mcg/act Susp (Budesonide) .... 2 Sprays Each Nostril Daily 7)  Allegra 180 Mg Tabs (Fexofenadine Hcl) .Marland Kitchen.. 1 Daily By Mouth As Needed 8)  Ibuprofen 200 Mg Tabs (Ibuprofen) .... As Needed  Allergies: 1)  ! * Novacaine 2)  ! Biaxin 3)  ! * Zomig 4)  ! Codeine 5)  ! * Valacyclovir 6)  ! Advair Diskus (Fluticasone-Salmeterol) 7)  Amoxicillin (Amoxicillin) 8)  Erythromycin Ethylsuccinate 9)  Sulfa  Physical Exam  General:  fatigued appearing feamle in NAD Head:  no maxillary sinus ttp Eyes:  Conjunctiva clear bilaterally.  Ears:  External ear exam shows no significant lesions or deformities.  Otoscopic examination reveals clear canals, tympanic membranes are intact bilaterally without bulging, retraction, inflammation or discharge. Hearing is grossly normal bilaterally. Nose:  nasal discharge, mucosal pallor.   Mouth:  MMM Neck:  no carotid bruit or thyromegaly no cervical or supraclavicular lymphadenopathy   Lungs:  Normal respiratory effort, chest expands symmetrically. Lungs are clear to auscultation, no crackles or wheezes. Heart:  Normal rate and regular rhythm. S1 and  S2 normal without gallop, murmur, click, rub or other extra sounds. Extremities:  L knee WNL. R knee NT along the joint line. No swelling, no echymosis.   Impression & Recommendations:  Problem # 1:  OSTEOPENIA (ICD-733.90) Assessment Improved  Nos arer actually as giood as they have been checked. Cont Evista, start back on Ca with Vit d and will check a Vit D lvl to decide about OTC Vit D repl. Her updated medication list for this problem includes:    Evista 60 Mg Tabs (Raloxifene hcl) .Marland Kitchen... Take 1 tablet by mouth once a day    Vitamin D 1000 Unit  Tabs (Cholecalciferol) .Marland Kitchen... Take one by mouth daily  Orders: T-Vitamin D (25-Hydroxy) [phone]) Venipuncture ([address]) Specimen Handling (28413)  Bone Density: abnormal (01/10/2007)  Problem # 2:  KNEE PAIN, RIGHT (ICD-719.46) Assessment: Unchanged  Discussed at length. Would suggest nonimpact exercise via elliptical vs treadmill. Her updated medication list for this problem includes:    Tylenol Arthritis Pain 650 Mg Cr-tabs (Acetaminophen) .Marland Kitchen... 2 tabs by mouth two times a day    Ibuprofen 200 Mg Tabs (Ibuprofen) .Marland Kitchen... As needed  Discussed strengthening exercises, use of ice or heat, and medications.   Complete Medication List: 1)  Evista 60 Mg Tabs (Raloxifene hcl) .... Take 1 tablet by mouth once a day 2)  Zomig Zmt 5 Mg Tbdp (Zolmitriptan) .Marland Kitchen.. 1 at onset of headache, can repeat in 2 hrs if not resolved 3)  Cosamin Ds 500-400 Mg Tabs (Glucosamine-chondroitin) .... 2 tablet s every morning 4)  Tussionex Pennkinetic Er 8-10 Mg/63ml Lqcr (Chlorpheniramine-hydrocodone) .... One tsp by mouth at bedtime as needed fort cough at night, sparingly. 5)  Tylenol Arthritis Pain 650 Mg Cr-tabs (Acetaminophen) .... 2 tabs by mouth two times a day 6)  Rhinocort Aqua 32 Mcg/act Susp (Budesonide) .... 2 sprays each nostril daily 7)  Allegra 180 Mg Tabs (Fexofenadine hcl) .Marland Kitchen.. 1 daily by mouth as needed 8)  Ibuprofen 200 Mg Tabs (Ibuprofen) .... As needed 9)  Vitamin D 1000 Unit Tabs (Cholecalciferol) .... Take one by mouth daily  Patient Instructions: 1)  RTC in the Fall for Comp Exam.  2)  May switch to Dr Ermalene Searing.  Current Allergies (reviewed today): ! * NOVACAINE ! BIAXIN ! * ZOMIG ! CODEINE ! * VALACYCLOVIR ! ADVAIR DISKUS (FLUTICASONE-SALMETEROL) AMOXICILLIN (AMOXICILLIN) ERYTHROMYCIN ETHYLSUCCINATE SULFA

## 2010-03-21 NOTE — Progress Notes (Signed)
Summary: Evista refill  Phone Note Refill Request Call back at [phone] Message from:  [facility] on March 15, 2009 11:24 AM  Refills Requested: Medication #1:  EVISTA 60 MG TABS Take 1 tablet by mouth once a day [facility] request refill on Evista. Pt saw Dr. Hetty Ely on 02/01/10 about testing for breast cancer. Please advise about Evista refill.   Method Requested: Electronic Initial call taken by: [name] LPN,  March 15, 2009 11:25 AM    Prescriptions: EVISTA 60 MG TABS (RALOXIFENE HCL) Take 1 tablet by mouth once a day  #30 x 12   Entered and Authorized by:   Shaune Leeks MD   Signed by:   Shaune Leeks MD on 03/15/2009   Method used:   Electronically to        [facility] (retail)       [address]       [place], [place]  [address]       Ph: [phone] or [phone]       Fax: [phone]   RxID:   [id number]

## 2010-03-21 NOTE — Progress Notes (Signed)
Summary: refill request for tussionex  Phone Note Refill Request Call back at Home Phone [phone] Message from:  Patient  Refills Requested: Medication #1:  Alice Smith ER 8-10 MG/5ML LQCR one tsp by mouth at bedtime as needed for cough at night Phoned request from pt, [facility] [address]  Initial call taken by: Lowella Petties CMA,  December 02, 2009 1:01 PM  Follow-up for Phone Call        please call in. She either needs appointment with me in next 2 months or needs follow up appointment with Hetty Ely since she is on this chronically.  Follow-up by: Crawford Givens MD,  December 05, 2009 8:53 AM  Additional Follow-up for Phone Call Additional follow up Details #1::        Patient notified as instructed by telephone. Was informed by patient that she will schedule an appointment. Rx called to pharmacy. Additional Follow-up by: Sydell Axon LPN,  December 05, 2009 10:23 AM    Prescriptions: Alice Smith ER 8-10 MG/5ML LQCR (CHLORPHENIRAMINE-HYDROCODONE) one tsp by mouth at bedtime as needed for cough at night, sparingly.  #8 oz x 0   Entered and Authorized by:   Crawford Givens MD   Signed by:   Crawford Givens MD on 12/05/2009   Method used:   Telephoned to ...       [facility] (retail)       [address]       [place], [place]  [address]       Ph: [phone] or [phone]       Fax: [phone]   RxID:   [id number]

## 2010-03-21 NOTE — Progress Notes (Signed)
Summary: Zomig ZMT 5mg  refill  Phone Note Refill Request Call back at [phone] Message from:  [facility] on February 22, 2009 9:41 AM  Refills Requested: Medication #1:  ZOMIG ZMT 5 MG  TBDP 1 at onset of headache [facility] request refill for Zomig ZMT 5mg . No date given for last refill. Please advise.    Method Requested: Electronic Initial call taken by: [name] LPN,  February 22, 2009 9:41 AM  Follow-up for Phone Call        Rx completed  Billie-Lynn Tyler Deis FNP  February 22, 2009 9:45 AM     Prescriptions: ZOMIG ZMT 5 MG  TBDP (ZOLMITRIPTAN) 1 at onset of headache, can repeat in 2 hrs if not resolved  #12 Tablet x 2   Entered and Authorized by:   Gildardo Griffes FNP   Signed by:   Gildardo Griffes FNP on 02/22/2009   Method used:   Electronically to        [facility] (retail)       [address]       [place], [place]  [address]       Ph: [phone] or [phone]       Fax: [phone]   RxID:   [id number]

## 2010-03-21 NOTE — Progress Notes (Signed)
Summary: chest pressure and tightness  Phone Note Call from Patient Call back at [phone]   Caller: Patient Call For: Dr Ermalene Searing Summary of Call: On 02/21/09 saw Dr. Ermalene Searing with congestion. Congestion never really went away. this morning has pressure in middle of chest with tightness. Pt is short of breath and feels like heart skips on and off. Pt also is dizzy. Pt is at home alone now but husband is on his way home from [place] and should be there by 3pm. Pt continues dry hacky cough but no wheezing. Pt has runny nose but no fever.  [facility]. Pt would like to be seen today.Please advise.  Initial call taken by: [name] LPN,  March 11, 2009 1:48 PM  Follow-up for Phone Call        Dr. Ermalene Searing said she would see pt. this afternoon. Made appt for 3:15 today. Pt said 65 year old is there with her so if problem before her husband gets home 1 yr old will call 911. [name] LPN  March 11, 2009 2:01 PM

## 2010-03-21 NOTE — Progress Notes (Signed)
Summary: tussionex   Phone Note Refill Request Call back at Home Phone [phone] Message from:  Patient on June 03, 2009 11:26 AM  Refills Requested: Medication #1:  Sandria Senter ER 8-10 MG/5ML LQCR one tsp by mouth at bedtime as needed fort cough at night patient wants rx sent [facility]. She says that she will run out this weekend and without she will have horrible coughing.   Initial call taken by: Melody Comas,  June 03, 2009 11:26 AM  Follow-up for Phone Call        refil times one in Dr Kathie Rhodes absence px written on EMR for call in  Follow-up by: Judith Part MD,  June 03, 2009 12:00 PM  Additional Follow-up for Phone Call Additional follow up Details #1::        Patient notified as instructed by telephone. Medication phoned to  [facility] pharmacy as instructed. [name] LPN  June 03, 2009 5:08 PM     Prescriptions: Sandria Senter ER 8-10 MG/5ML LQCR (CHLORPHENIRAMINE-HYDROCODONE) one tsp by mouth at bedtime as needed fort cough at night, sparingly.  #8 oz x 0   Entered and Authorized by:   Judith Part MD   Signed by:   [name] LPN on 53/66/4403   Method used:   Telephoned to ...       [facility] (retail)       [address]       [place], [place]  [address]       Ph: [phone] or [phone]       Fax: [phone]   RxID:   [id number]

## 2010-03-21 NOTE — Progress Notes (Signed)
Summary: update on migraines   Phone Note Call from Patient Call back at Home Phone [phone] Call back at [phone]   Caller: Patient Call For: Crawford Givens MD Summary of Call: Patient called to let you know that this week she is up to 4 tablets of the Topamax. She said that she is doing better with her migraines, but still has some issues with them. She is asking what to do at this point since she is taking the 4 a day. Please advise.  Initial call taken by: Melody Comas,  January 26, 2010 3:07 PM  Follow-up for Phone Call        I would go up to 5 tabs a day.  I would like to see her for a few weeks after she has been at that dose.  Please schedule OV.  thanks. Follow-up by: Crawford Givens MD,  January 26, 2010 5:52 PM  Additional Follow-up for Phone Call Additional follow up Details #1::        Patient Advised..  Patient says she will schedule the appointment later.  She was advised to ask for a 30 minute appointment per Dr. Para March. Additional Follow-up by: Delilah Shan CMA Nataleigh Griffin Dull),  January 27, 2010 8:18 AM

## 2010-03-21 NOTE — Progress Notes (Signed)
Summary: tussionex  Phone Note Refill Request Call back at Home Phone [phone] Message from:  Patient on April 29, 2009 4:08 PM  Refills Requested: Medication #1:  Sandria Senter ER 8-10 MG/5ML LQCR one tsp by mouth at bedtime as needed fort cough at night Patient says she is out of tussionex and she is already starting to cough. She says that when she goes to bed at night it is really bad. W[facility].   Initial call taken by: Melody Comas,  April 29, 2009 4:10 PM Call For: Shaune Leeks MD  Follow-up for Phone Call        px written on EMR for call in  Follow-up by: Judith Part MD,  April 29, 2009 5:09 PM  Additional Follow-up for Phone Call Additional follow up Details #1::        Patient notified as instructed by telephone. [facility]ication [facility]oned [facility] pharmacy as instructed. [name] LPN  April 29, 2009 5:23 PM     New/Updated Medications: Sandria Senter ER 8-10 MG/5ML LQCR (CHLORPHENIRAMINE-HYDROCODONE) one tsp by mouth at bedtime as needed fort cough at night, sparingly. Prescriptions: TUSSIONEX PENNKINETIC ER 8-10 MG/5ML LQCR (CHLORPHENIRAMINE-HYDROCODONE) one tsp by mouth at bedtime as needed fort cough at night, sparingly.  #8 oz x 0   Entered and Authorized by:   Judith Part MD   Signed by:   [name] LPN on 09/81/1914   Method used:   Telephoned to ...       [facility]  [place] (retail)       [address]       [place], [place]  [address]       Ph: [phone] or [phone]       Fax: [phone]   RxID:   [id number]

## 2010-03-23 NOTE — Progress Notes (Signed)
Summary: cant take topamax  Phone Note Call from Patient Call back at Home Phone [phone] Call back at [phone]   Caller: Patient Summary of Call: Pt wants you to know that she stopped taking topamax because it caused some confusion and forgetfullness.  She is asking if she can try something else.  [facility].  She also wants you to know that she is having knee surgery on 1/11 by Dr. Leslee Home at [facility].   Initial call taken by: Lowella Petties CMA, AAMA,  February 22, 2010 12:24 PM  Follow-up for Phone Call        If she is getting ready to have surgery, i wouldn't start a new medicine right now.  I would get through the surgery and then have her come in so we can talk about options for the HA medicine.  Depending on how she is doing after the surgery (and depending on what meds she needs after the surgery), that could affect what med we use to prevent the headaches.  thanks.   Follow-up by: Crawford Givens MD,  February 22, 2010 12:29 PM  Additional Follow-up for Phone Call Additional follow up Details #1::        Patient Advised.  Additional Follow-up by: Delilah Shan CMA (AAMA),  February 22, 2010 2:24 PM   New Allergies: ! * TOPAMAX New Allergies: ! * TOPAMAX

## 2010-03-23 NOTE — Progress Notes (Signed)
Summary: tussionex  Phone Note Refill Request Message from:  Fax from Pharmacy on February 22, 2010 9:42 AM  Refills Requested: Medication #1:  Sandria Senter ER 8-10 MG/5ML LQCR one tsp by mouth at bedtime as needed for cough at night   Last Refilled: 01/10/2010 [facility] [phone]  Initial call taken by: Melody Comas,  February 22, 2010 9:43 AM  Follow-up for Phone Call        please make sure this was handled yesterday, if so, no response to me needed Follow-up by: Crawford Givens MD,  February 22, 2010 11:30 AM  Additional Follow-up for Phone Call Additional follow up Details #1::        Sent in yesterday.   Lugene Fuquay CMA (AAMA)  February 22, 2010 11:52 AM

## 2010-03-23 NOTE — Progress Notes (Signed)
Summary: Rx Hydrocodone susp  Phone Note Refill Request Call back at [phone] Message from:  [facility] on February 21, 2010 9:56 AM  Refills Requested: Medication #1:  Sandria Senter ER 8-10 MG/5ML LQCR one tsp by mouth at bedtime as needed for cough at night   Last Refilled: 01/10/2010 Initial call taken by: Sydell Axon LPN,  February 21, 2010 9:56 AM  Follow-up for Phone Call        please call in.  Follow-up by: Crawford Givens MD,  February 21, 2010 10:20 AM  Additional Follow-up for Phone Call Additional follow up Details #1::        Medication phoned to pharmacy.  Additional Follow-up by: Delilah Shan CMA (AAMA),  February 21, 2010 10:43 AM    Prescriptions: Sandria Senter ER 8-10 MG/5ML LQCR (CHLORPHENIRAMINE-HYDROCODONE) one tsp by mouth at bedtime as needed for cough at night, sparingly.  #8 oz x 0   Entered and Authorized by:   Crawford Givens MD   Signed by:   Crawford Givens MD on 02/21/2010   Method used:   Telephoned to ...       [facility] (retail)       [address]       [place], [place]  [address]       Ph: [phone] or [phone]       Fax: [phone]   RxID:   [id number]

## 2010-03-23 NOTE — Assessment & Plan Note (Signed)
Summary: F/U 30 MIN PER LUGENE/DLO   Vital Signs:  Patient profile:   65 year old female Height:      62.5 inches Weight:      171 pounds BMI:     30.89 Temp:     98.5 degrees F oral Pulse rate:   84 / minute Pulse rhythm:   regular BP sitting:   120 / 70  (left arm) Cuff size:   regular  Vitals Entered By: Delilah Shan CMA  Dull) (February 02, 2010 11:17 AM) CC: Follow-up visit 30 min.   History of Present Illness: Migraine- has been gradually increasing the topamax.  She had a HA  ~2 weeks ago that lasted 3 days.  Since then she was doing some better until yesterday.  She thought she was going to have another 'bad' on yesterday. Much improved since taking 3 ibuprofen.  Some tingling in hands and feet, not constant.  More prominent as topamax increased.    HA frequency is much better along with decrease in severity.  Has not started the flexeril as needed yet.  Drinking 2 cups of regular coffee a day.    Allergies: 1)  ! * Novacaine 2)  ! Biaxin 3)  ! * Zomig 4)  ! Codeine 5)  ! * Valacyclovir 6)  ! Advair Diskus (Fluticasone-Salmeterol) 7)  Amoxicillin (Amoxicillin) 8)  Erythromycin Ethylsuccinate 9)  Sulfa  Past History:  Past Medical History: Last updated: 01/03/2010 Asthma GERD s/p fundoplication Hyperlipidemia Osteoporosis, last DXA 2010 migraine- HA eval with Dr. Neale Burly 11/2009  Review of Systems       See HPI.  Otherwise negative.    Physical Exam  General:  GEN: nad, alert and oriented HEENT: mucous membranes moist NECK: supple w/o LA CV: rrr.  no murmur PULM: ctab, no inc wob ABD: soft, +bs EXT: no edema SKIN: no acute rash  CN 2-12 wnl B, S/S/DTR wnl x4    Impression & Recommendations:  Problem # 1:  COMMON MIGRAINE (ICD-346.10)  >25 min spent with patient, at least half of which was spent on counseling on plan.  Inc topamax, start the flexeril as needed, and cut down on coffee.  Fu as needed.  She agrees.  Much improved.  The following  medications were removed from the medication list:    Zomig Zmt 5 Mg Tbdp (Zolmitriptan) .Marland Kitchen... 1 at onset of headache, can repeat in 2 hrs if not resolved Her updated medication list for this problem includes:    Ibuprofen 200 Mg Tabs (Ibuprofen) .Marland Kitchen... As needed  Orders: TLB-BMP (Basic Metabolic Panel-BMET) (80048-METABOL)  Complete Medication List: 1)  Evista 60 Mg Tabs (Raloxifene hcl) .... Take 1 tablet by mouth once a day 2)  Cosamin Ds 500-400 Mg Tabs (Glucosamine-chondroitin) .... 2 tablet s every morning 3)  Tussionex Pennkinetic Er 8-10 Mg/39ml Lqcr (Chlorpheniramine-hydrocodone) .... One tsp by mouth at bedtime as needed for cough at night, sparingly. 4)  Allegra 180 Mg Tabs (Fexofenadine hcl) .Marland Kitchen.. 1 daily by mouth as needed 5)  Ibuprofen 200 Mg Tabs (Ibuprofen) .... As needed 6)  Vitamin D 1000 Unit Tabs (Cholecalciferol) .... Take one by mouth daily 7)  Triamcinolone Nasal Spray  .... One spray each nostril once daily 8)  Vitamin C 500 Mg Tabs (Ascorbic acid) .... Once daily 9)  [facility] Biotin 5000 Mcg Caps (Biotin) .... Take 1 capsule by mouth once a day 10)  Calcium Carbonate-vitamin D 600-400 Mg-unit Tabs (Calcium carbonate-vitamin d) .... Take 1 or 2 once daily  11)  Fish Oil Oil (Fish oil) .... 1,000 mg. two capsules once daily 12)  Topamax 50 Mg Tabs (Topiramate) .... 3 by mouth once daily 13)  Vitamin B-12 100 Mcg Tabs (Cyanocobalamin) .Marland Kitchen.. 1 by mouth once daily 14)  Flexeril 10 Mg Tabs (Cyclobenzaprine hcl) .... 1/2 to 1 tab by mouth three times a day for headache  Patient Instructions: 1)  I would increase to a total of 150mg  of topamax a day (3x50mg ).  Let me know if you have too much tingling on that dose.  I would use the flexeril/cyclobenzaprine 10mg , 1/2 to 1 tab three times a day as needed for headache (when you feel it starting).  If you continue to have frequent headaches, let me know.  Take care.  Glad to see you. 2)  You can get your results through our phone system.   Follow the instructions on the blue card.  Prescriptions: TOPAMAX 50 MG TABS (TOPIRAMATE) 3 by mouth once daily  #150 x 5   Entered and Authorized by:   Crawford Givens MD   Signed by:   Crawford Givens MD on 02/02/2010   Method used:   Electronically to        [facility] [id number] (retail)       [address]       [place], [place]  [address]       Ph: [phone] or [phone]       Fax: [phone]   RxID:   [id number]    Orders Added: 1)  Est. Patient Level IV [32951] 2)  TLB-BMP (Basic Metabolic Panel-BMET) [80048-METABOL]    Current Allergies (reviewed today): ! * NOVACAINE ! BIAXIN ! * ZOMIG ! CODEINE ! * VALACYCLOVIR ! ADVAIR DISKUS (FLUTICASONE-SALMETEROL) AMOXICILLIN (AMOXICILLIN) ERYTHROMYCIN ETHYLSUCCINATE SULFA

## 2010-05-02 ENCOUNTER — Ambulatory Visit (INDEPENDENT_AMBULATORY_CARE_PROVIDER_SITE_OTHER): Payer: BC Managed Care – PPO | Admitting: Family Medicine

## 2010-05-02 ENCOUNTER — Encounter: Payer: Self-pay | Admitting: Family Medicine

## 2010-05-02 DIAGNOSIS — N390 Urinary tract infection, site not specified: Secondary | ICD-10-CM | POA: Insufficient documentation

## 2010-05-02 DIAGNOSIS — G43009 Migraine without aura, not intractable, without status migrainosus: Secondary | ICD-10-CM

## 2010-05-02 LAB — CONVERTED CEMR LAB
Blood in Urine, dipstick: NEGATIVE
Glucose, Urine, Semiquant: NEGATIVE
Ketones, urine, test strip: NEGATIVE
pH: 6

## 2010-05-03 ENCOUNTER — Encounter: Payer: Self-pay | Admitting: Family Medicine

## 2010-05-05 ENCOUNTER — Telehealth: Payer: Self-pay | Admitting: Family Medicine

## 2010-05-06 ENCOUNTER — Encounter: Payer: Self-pay | Admitting: Family Medicine

## 2010-05-09 NOTE — Progress Notes (Signed)
Summary: Tussionex  Phone Note Call from Patient Call back at Home Phone [phone] P PH     Caller: Patient Call For: Crawford Givens MD Summary of Call: Patient says she has been coughing really bad since she was here for her visit and is almost out of the Tussionex that she had.  She is [facility]. Initial call taken by: Delilah Shan CMA Chrystian Ressler Dull),  May 05, 2010 8:33 AM  Follow-up for Phone Call        please call in.  Crawford Givens MD  May 05, 2010 1:52 PM   Medication phoned to pharmacy. Delilah Shan CMA Chinwe Lope Dull)  May 05, 2010 3:40 PM     Prescriptions: Sandria Senter ER 8-10 MG/5ML LQCR (CHLORPHENIRAMINE-HYDROCODONE) one tsp by mouth at bedtime as needed for cough at night, sparingly.  #8 oz x 0   Entered and Authorized by:   Crawford Givens MD   Signed by:   Crawford Givens MD on 05/05/2010   Method used:   Telephoned to ...       [facility] (retail)       [address]       [place], [place]  [address]       Ph: [phone] or [phone]       Fax: [phone]   RxID:   [id number]

## 2010-05-09 NOTE — Assessment & Plan Note (Signed)
Summary: MIGRAINE HA/CLE   [employer]   Vital Signs:  Patient profile:   65 year old female Height:      62.5 inches Weight:      165.25 pounds BMI:     29.85 Temp:     98.5 degrees F oral Pulse rate:   88 / minute Pulse rhythm:   regular BP sitting:   142 / 84  (left arm) Cuff size:   large  Vitals Entered By: Delilah Shan CMA  Dull) (May 02, 2010 10:47 AM) CC: 1. Migraine headache.   2.  ? UTI, Headache   History of Present Illness: L knee arthroscopy done- OA noted.   dysuria:yes, burning duration of symptoms: weeks abdominal pain:yes, suprapubic fevers:no vomiting: dry heavesfrom HA, see below, can't vomit from fundoplication  HA for 4 days.  Dry heaving.  Pain around the had with pain behind the eyes.  Throbs. Photo/phonophobia.  Tramadol didn't help the pain.  Has been talking tylenol w/o relief.  No help with ibuprofen.  she couldn't tolerate the topamax prev, but it was helping her HAs.   Current Medications (verified): 1)  Evista 60 Mg Tabs (Raloxifene Hcl) .... Take 1 Tablet By Mouth Once A Day 2)  Tussionex Pennkinetic Er 8-10 Mg/79ml Lqcr (Chlorpheniramine-Hydrocodone) .... One Tsp By Mouth At Bedtime As Needed For Cough At Night, Sparingly. 3)  Flexeril 10 Mg Tabs (Cyclobenzaprine Hcl) .... 1/2 To 1 Tab By Mouth Three Times A Day For Headache 4)  Tramadol Hcl 50 Mg Tabs (Tramadol Hcl) .... Take 1 Tablet By Mouth Every 6 Hours As Needed 5)  Cipro 250 Mg Tabs (Ciprofloxacin Hcl) .Marland Kitchen.. 1 By Mouth Two Times A Day 6)  Promethazine Hcl 25 Mg Tabs (Promethazine Hcl) .Marland Kitchen.. 1 By Mouth Three Times A Day As Needed For Nav, Sedation Caution  Allergies: 1)  ! * Novacaine 2)  ! Biaxin 3)  ! * Zomig 4)  ! Codeine 5)  ! * Valacyclovir 6)  ! Advair Diskus (Fluticasone-Salmeterol) 7)  ! * Topamax 8)  ! Celebrex 9)  Amoxicillin (Amoxicillin) 10)  Erythromycin Ethylsuccinate 11)  Sulfa  Review of Systems       See HPI.  Otherwise negative.    Physical Exam  General:  in  dark room initially. normocephalic atraumatic perrly, eomi mucous membranes moist neck supple regular rate and rhythm clear to auscultation bilaterally abdomen with suprapubic tenderness, but o/w benign ext well perfused. CN 2-12 wnl B, S/S/DTR wnl x4    Impression & Recommendations:  Problem # 1:  COMMON MIGRAINE (ICD-346.10) Toradol injection given and she'll use phenergan for NAV and flexeril for pain.  Sedation caution given.  She'll follow up and we can consider proph meds.  BB may be of benefit.  Okay for outpatient follow up.  The following medications were removed from the medication list:    Ibuprofen 200 Mg Tabs (Ibuprofen) .Marland Kitchen... As needed Her updated medication list for this problem includes:    Tramadol Hcl 50 Mg Tabs (Tramadol hcl) .Marland Kitchen... Take 1 tablet by mouth every 6 hours as needed  Orders: Admin of Therapeutic Inj  intramuscular or subcutaneous (09811) Ketorolac-Toradol 15mg  (B1478)  Problem # 2:  UTI (ICD-599.0) start cipro and check ucx.  she agrees.   Her updated medication list for this problem includes:    Cipro 250 Mg Tabs (Ciprofloxacin hcl) .Marland Kitchen... 1 by mouth two times a day  Orders: Prescription Created Electronically [phone]) Specimen Handling (13086) T-Culture, Urine ([id number])  Complete Medication List:  1)  Evista 60 Mg Tabs (Raloxifene hcl) .... Take 1 tablet by mouth once a day 2)  Tussionex Pennkinetic Er 8-10 Mg/23ml Lqcr (Chlorpheniramine-hydrocodone) .... One tsp by mouth at bedtime as needed for cough at night, sparingly. 3)  Flexeril 10 Mg Tabs (Cyclobenzaprine hcl) .... 1/2 to 1 tab by mouth three times a day for headache 4)  Tramadol Hcl 50 Mg Tabs (Tramadol hcl) .... Take 1 tablet by mouth every 6 hours as needed 5)  Cipro 250 Mg Tabs (Ciprofloxacin hcl) .Marland Kitchen.. 1 by mouth two times a day 6)  Promethazine Hcl 25 Mg Tabs (Promethazine hcl) .Marland Kitchen.. 1 by mouth three times a day as needed for nav, sedation caution  Other Orders: UA Dipstick w/o  Micro (manual) (16109)  Patient Instructions: 1)  Take promethazine for nausea as needed.  It can make you drowsy.  Take the flexeril as needed for headache- it can also make you drowsy.  Start the cipro today for UTI.  Drink plenty of fluids, as tolerated with the headache.  I want you see you back for a appointment to talk about options for your migraines.   Prescriptions: FLEXERIL 10 MG TABS (CYCLOBENZAPRINE HCL) 1/2 to 1 tab by mouth three times a day for headache  #30 x 1   Entered and Authorized by:   Crawford Givens MD   Signed by:   Crawford Givens MD on 05/02/2010   Method used:   Print then Give to Patient   RxID:   [id number] PROMETHAZINE HCL 25 MG TABS (PROMETHAZINE HCL) 1 by mouth three times a day as needed for NAV, sedation caution  #20 x 1   Entered and Authorized by:   Crawford Givens MD   Signed by:   Crawford Givens MD on 05/02/2010   Method used:   [facility] to        [facility] (retail)       [address]       [place], [place]  [address]       Ph: [phone] or [phone]       Fax: [phone]   RxID:   [id number] CIPRO 250 MG TABS (CIPROFLOXACIN HCL) 1 by mouth two times a day  #6 x 0   Entered and Authorized by:   Crawford Givens MD   Signed by:   Crawford Givens MD on 05/02/2010   Method used:   [facility]ically [facility]        [facility] [id number] (retail)       [address]       [place], [place]  [address]       Ph: [phone] or [phone]       Fax: [phone]   RxID:   [id number]    Medication Administration  Injection # 1:    Medication: Ketorolac-Toradol 15mg     Diagnosis: COMMON MIGRAINE (ICD-346.10)    Route: IM    Site: RUOQ gluteus    Exp Date: 04/20/2011    Lot #: [id number]    Mfr: Hospira    Comments: 30 mg. given.    Patient tolerated injection without complications    Given by: Delilah Shan CMA Charlene Detter Dull) (May 02, 2010 11:33 AM)  Orders Added: 1)  UA Dipstick w/o Micro (manual)  [81002] 2)  Est. Patient Level IV [06269] 3)  Prescription Created Electronically [G8553] 4)  Specimen Handling [99000] 5)  T-Culture, Urine [[id number]] 6)  Admin of Therapeutic Inj  intramuscular or subcutaneous [96372] 7)  Ketorolac-Toradol 15mg  [id number]    Current Allergies (reviewed today): ! * NOVACAINE ! BIAXIN ! * ZOMIG ! CODEINE ! * VALACYCLOVIR ! ADVAIR DISKUS (FLUTICASONE-SALMETEROL) ! * TOPAMAX ! CELEBREX AMOXICILLIN (AMOXICILLIN) ERYTHROMYCIN ETHYLSUCCINATE SULFA  Laboratory Results   Urine Tests  Date/Time Received: May 02, 2010 10:58 AM   Routine Urinalysis   Color: yellow Appearance: Cloudy Glucose: negative   (Normal Range: Negative) Bilirubin: negative   (Normal Range: Negative) Ketone: negative   (Normal Range: Negative) Spec. Gravity: >=1.030   (Normal Range: 1.003-1.035) Blood: negative   (Normal Range: Negative) pH: 6.0   (Normal Range: 5.0-8.0) Protein: trace   (Normal Range: Negative) Urobilinogen: 0.2   (Normal Range: 0-1) Nitrite: positive   (Normal Range: Negative) Leukocyte Esterace: small   (Normal Range: Negative)         Medication Administration  Injection # 1:    Medication: Ketorolac-Toradol 15mg     Diagnosis: COMMON MIGRAINE (ICD-346.10)    Route: IM    Site: RUOQ gluteus    Exp Date: 04/20/2011    Lot #: [id number]    Mfr: Hospira    Comments: 30 mg. given.    Patient tolerated injection without complications    Given by: Delilah Shan CMA  Dull) (May 02, 2010 11:33 AM)  Orders Added: 1)  UA Dipstick w/o Micro (manual) [81002] 2)  Est. Patient Level IV [95284] 3)  Prescription Created Electronically [G8553] 4)  Specimen Handling [99000] 5)  T-Culture, Urine [[id number]] 6)  Admin of Therapeutic Inj  intramuscular or subcutaneous [96372] 7)  Ketorolac-Toradol 15mg  [J1885]

## 2010-05-31 ENCOUNTER — Ambulatory Visit (INDEPENDENT_AMBULATORY_CARE_PROVIDER_SITE_OTHER): Payer: BC Managed Care – PPO | Admitting: Family Medicine

## 2010-05-31 ENCOUNTER — Encounter: Payer: Self-pay | Admitting: Family Medicine

## 2010-05-31 VITALS — BP 124/84 | HR 88 | Temp 98.3°F | Wt 166.0 lb

## 2010-05-31 DIAGNOSIS — G43009 Migraine without aura, not intractable, without status migrainosus: Secondary | ICD-10-CM

## 2010-05-31 MED ORDER — METOPROLOL SUCCINATE ER 25 MG PO TB24: ORAL_TABLET | ORAL | Status: DC

## 2010-05-31 NOTE — Progress Notes (Signed)
Migraines.  Dec in HA severity with flexeril at night along with promethazine.  She was able to take both as prn during the day when she did have a daytime HA (this was done twice).  Since starting the above regimen, she's had 2 "bad" HAs.  In a week, she'll usually have some mild HA most days of the week.  +Photo/phonophobia with HA.  She tries to mediate- she's working on this, but it hasn't had a great effect yet.   She cut down to 1/2-1 cup of coffee a day.  Soda- occ, not daily.     Taking Tramadol for knee pain.   Occ 'quivering' in LLQ, lasts a few seconds.  A few episodes a day or not at all.  Irregular timing.  No FCNAV.  No blood in stool. No pain.     Meds, vitals, and allergies reviewed.   ROS: See HPI.  Otherwise, noncontributory.  GEN: nad, alert and oriented HEENT: mucous membranes moist NECK: supple w/o LA CV: rrr. PULM: ctab, no inc wob ABD: soft, +bs, not ttp EXT: no edema SKIN: no acute rash

## 2010-05-31 NOTE — Assessment & Plan Note (Addendum)
Start BB and monitor pulse.  Notify MD if orthostatic.  >25 min spent with patient, at least half of which was spent on counseling.  D/w pt about preventive meds and routine use of flexeril/promethazine.  She understood.  Will call back as needed. Abdominal sx- likely muscle quivering- possibly the oblique, she'll monitor and fu prn.  She agrees.

## 2010-05-31 NOTE — Patient Instructions (Signed)
Take the promethazine and flexeril at night for 1 more week.  Take 1/2 of the toprol at night in the meantime.  If you are tolerating the medicine, try to inc to 1 tab of toprol.  Gradually decrease the flexeril and promethazine at night (you can still take them when you have a headache).  Call me with an update in 1 month.  Try to cut back on coffee and soda.

## 2010-06-09 ENCOUNTER — Other Ambulatory Visit: Payer: Self-pay | Admitting: Family Medicine

## 2010-06-12 ENCOUNTER — Other Ambulatory Visit: Payer: Self-pay | Admitting: *Deleted

## 2010-06-12 MED ORDER — PROMETHAZINE HCL 25 MG PO TABS: 25.0000 mg | ORAL_TABLET | Freq: Three times a day (TID) | ORAL | Status: DC | PRN

## 2010-07-07 NOTE — Op Note (Signed)
Alice Smith, MIDDENDORF              ACCOUNT NO.:  [id number]   MEDICAL RECORD NO.:  [id number]          PATIENT TYPE:  AMB   LOCATION:  DAY                          FACILITY:  [facility]   PHYSICIAN:  Adolph Pollack, M.D.DATE OF BIRTH:  09-May-1945   DATE OF PROCEDURE:  10/05/2004  DATE OF DISCHARGE:                                 OPERATIVE REPORT   PREOPERATIVE DIAGNOSES:  1.  Medically refractory gastroesophageal reflux disease was supraesophageal      manifestations.  2.  A 1 cm painful left forehead soft tissue mass.   POSTOPERATIVE DIAGNOSES:  1.  Medically refractory gastroesophageal reflux disease was supraesophageal      manifestations.  2.  A 1 cm painful left forehead soft tissue mass.   PROCEDURE:  1.  Laparoscopic Nissen fundoplication.  2.  Excision of 1 cm left forehead mass.   SURGEON:  Adolph Pollack, M.D.   ASSISTANT:  Cyndia Bent, M.D.   ANESTHESIA:  General.   ESTIMATED BLOOD LOSS:  1000 mL.   INDICATIONS:  This is a 65 year old female who has been having  supraesophageal manifestations of gastroesophageal reflux disease. Despite  maximal medical therapy, she continues to have these symptoms. I discussed  with her in the spring/later winter laparoscopic Nissen fundoplication. She  went ahead and had manometry done and then returned at the end of June  wanting to proceed with the operation. She now presents for that.  The procedure and the risks were discussed with her preoperatively.   TECHNIQUE:  She was seen in the holding area. The left forehead mass was  then marked with my initials. She is brought to the operating room, placed  supine on the operating table and a general anesthetic was administered. A  Foley catheter was placed in the bladder and the abdominal wall was  sterilely prepped and draped. A small supraumbilical incision was made  through the skin and subcutaneous tissue. The midline fascia was identified  and a small incision  made in that and the peritoneal cavity was entered  under direct vision. A pursestring suture of #0 Vicryl was placed around the  fascial edges. A Hassan trocar was introduced into the peritoneal cavity and  a pneumoperitoneum was created by insufflation of CO2 gas.   Next, a laparoscope was introduced. Under direct vision, a 10 mm trocar was  placed in the lateral right upper quadrant and one the medial right upper  quadrant. A 10 mm trocar was placed in the lateral left upper quadrant. A 5  mm trocar was then placed through a subxiphoid incision and this was removed  and Nathanson liver retractor was inserted into the epigastric region. The  left lobe the liver was retracted anteriorly exposing the gastroesophageal  junction. The thin gastrohepatic ligament was then divided up to the level  of the right crus and the phrenoesophageal ligament was divided anteriorly  exposing the left crus. Using careful blunt dissection, the esophagus was  dissected free from the right crus and a retroesophageal window was then  created.   Following this, the mid fundus was grasped and short  gastric blood vessels  were divided mobilizing the fundus. The proximal fundus was fairly adherent  to the spleen and in mobilizing this, one of the short gastric vessels began  bleeding. I could only partially control it with a hemoclip. I subsequently  used some FloSeal and Surgicel as well as some small sponges to pack this  off. It did bleed quite a bit, but we were able to control it. The total  blood loss was approximately 1000 mL during the case from this, however, she  was hemodynamically stable and responded well to crystalloid resuscitation.  There was no hypotension present and she continued to have a good urine  output.   Following this, I then tightened the hiatus by putting a single zero  nonabsorbable suture in. I did not see a hiatal hernia present initially. I  then performed a 36o degree wrap over  a size 50 lighted bougie with the  first proximal two bites incorporating a bite of the left leaf of the wrap,  the right leaf, and the esophagus and the last bite just incorporating the  two sides of the wrap. The wrap was approximately 2 cm long. The lighted  bougie was removed. The wrap was floppy and under no tension. I then  reinspected the left upper quadrant region. I removed all five sponges that  were placed and these were counted extracorporeally. I then removed some  Surgicel and then put some more FloSeal and Surgicel down. At this point,  the area was hemostatic. I evacuated as much blood as possible, irrigated  the area and evacuated the fluid.   The Nathanson retractor was subsequently removed. I then released all the  trocars and released the pneumoperitoneum. The supraumbilical fascial defect  was closed by tightening up and tying down the pursestring suture. The six  skin incisions were then closed (one extra was needed for suction in the  left mid abdominal area) with 4-0 Monocryl subcuticular stitches, Steri-  Strips and sterile dressing applied.   Next some of the hair on the left forehead was shaved and the area was  sterilely prepped and draped. A small curvilinear incision was made directly  over the soft tissue mass. The mass was underneath the frontalis muscle and  this was dissected. A lipomatous mass was then removed measuring 1 cm.  Bleeding was controlled with the cautery. Once hemostasis was adequate, I  closed the skin with a running 4-0 subcuticular Monocryl suture. Dermabond  was applied.   She tolerated both procedures fairly well. There were no obvious  complications present. She subsequently was taken to the recovery room in  satisfactory condition.      Adolph Pollack, M.D.  Electronically Signed     TJR/MEDQ  D:  10/05/2004  T:  10/05/2004  Job:  [id number]   cc:   Laurita Quint, M.D. [address] [place]  [place] [address]   Fax: [phone]   Anselmo Rod, M.D.  [address]  [place]  [place] [address]  Fax: [phone]   Charlaine Dalton. Sherene Sires, M.D. [facility]  [address]  [place]  [place] [address]   Lucky Cowboy, MD  [phone] [place]  [place] [address]  Fax: [phone]   Jessica Priest, M.D.  [address]  [place] [address]  Fax: [phone]

## 2010-07-07 NOTE — Op Note (Signed)
NAMELINDORA, ALVIAR              ACCOUNT NO.:  [id number]   MEDICAL RECORD NO.:  [id number]          PATIENT TYPE:  AMB   LOCATION:  ENDO                         FACILITY:  [facility]   PHYSICIAN:  Anselmo Rod, M.D.  DATE OF BIRTH:  [birth year]   DATE OF PROCEDURE:  01/10/2004  DATE OF DISCHARGE:                                 OPERATIVE REPORT   PROCEDURE PERFORMED:  Esophagogastroduodenoscopy.   ENDOSCOPIST:  Charna Elizabeth, M.D.   INSTRUMENT USED:  Olympus video panendoscope.   INDICATIONS FOR PROCEDURE:  The patient is a 65 year old white female with a  history of reflux, frequent coughing and epigastric discomfort rule out  peptic ulcer disease, esophagitis, Barrett's mucosa, etc.   PREPROCEDURE PREPARATION:  Informed consent was procured from the patient.  The patient was fasted for eight hours prior to the procedure.   PREPROCEDURE PHYSICAL:  The patient had stable vital signs.  Neck supple,  chest clear to auscultation.  S1, S2 regular.  No murmurs, rubs or gallops,  rales, rhonchi or wheezing.  Abdomen soft with normal bowel sounds.   DESCRIPTION OF PROCEDURE:  The patient was placed in the left lateral  decubitus position and sedated with 50 mg of Demerol and 5 mg of Versed in  slow incremental doses.  Once the patient was adequately sedated and  maintained on low-flow oxygen and continuous cardiac monitoring, the Olympus  video panendoscope was advanced through the mouth piece over the tongue into  the esophagus under direct vision.  The entire esophagus appeared normal  with no evidence of ring, stricture, masses, esophagitis or Barrett's  mucosa.  The scope was then advanced to the stomach.  The entire gastric  mucosa and proximal small bowel appeared normal.  Retroflexion in the high  cardia revealed no abnormalities.  The patient tolerated the procedure well  without complications.   IMPRESSION:  Normal esophagogastroduodenoscopy.   RECOMMENDATIONS:  1.  Continue  present care.  2.  Outpatient followup as need arises in the future.       JNM/MEDQ  D:  01/10/2004  T:  01/10/2004  Job:  [id number]   cc:   Laurita Quint, M.D.  [address] [place]  [place] [address]  Fax: [phone]   Lucky Cowboy, MD  [address] [place]  [place] [address]  Fax: [phone]   Jessica Priest, M.D.  [address] [place]  [place] [address]  Fax: [phone]

## 2010-07-07 NOTE — Discharge Summary (Signed)
[name], [name]              ACCOUNT NO.:  [id number]   MEDICAL RECORD NO.:  [id number]          PATIENT TYPE:  INP   LOCATION:  1609                         FACILITY:  [facility]   PHYSICIAN:  Adolph Pollack, M.D.DATE OF BIRTH:  [birth year]   DATE OF ADMISSION:  10/05/2004  DATE OF DISCHARGE:  10/09/2004                                 DISCHARGE SUMMARY   PRIMARY DISCHARGE DIAGNOSIS:  Gastroesophageal reflux disease.   SECONDARY DIAGNOSES:  1.  Asthma.  2.  Irritable bowel syndrome.  3.  Benign soft tissue mass left forehead.   PROCEDURES:  1.  Laparoscopic Nissan fundoplication.  2.  Excision of 1 cm left forehead mass.   REASON FOR ADMISSION:  Ms. Alice Smith is a 65 year old female who has medically-  refractory gastroesophageal reflux disease with a fair amount of  supraesophageal manifestations. She has been evaluated and was admitted for  the above procedures. She also has a known left forehead soft tissue mass  that had been growing some.   HOSPITAL COURSE:  She underwent both of the procedures. She had some nausea  and some dry heaves postoperatively, and some hyponatremia with a sodium of  129. She did have some intraoperative bleeding and hemoglobin was 9.3 on  postoperative day #1. Some of the pain medicine made her a bit lethargic so  this was decreased somewhat. Her nausea began to improve and she was able to  be started on a full liquid diet. She began having bowel movements and  passing flatus, had some migraine headaches. Her anemia was stable, and on  October 09, 2004 it was felt that she could be discharged.   DISPOSITION:  Discharged to home in satisfactory condition on October 09, 2004. She is given Percocet for pain and told to take Gas-X for gas  bloating. I also recommended a chewable vitamin to her. She was given  dietary and activity restrictions, and told to call for a follow-up  appointment in 2-3 weeks.      Adolph Pollack, M.D.  Electronically Signed     TJR/MEDQ  D:  11/03/2004  T:  11/03/2004  Job:  [id number]   cc:   Lucky Cowboy, MD  [phone] [place]  [place] [address]  Fax: [phone]   Jessica Priest, M.D.  [address]  [place] [address]  Fax: [phone]   Charlaine Dalton. Sherene Sires, M.D. [facility]  [address]  [place]  [place] [address]   Anselmo Rod, M.D.  Fax: [phone]   Laurita Quint, M.D.  [address] [place]  [place] [address]  Fax: [phone]

## 2010-07-07 NOTE — H&P (Signed)
Alice Smith, Alice Smith              ACCOUNT NO.:  [id number]   MEDICAL RECORD NO.:  [id number]          PATIENT TYPE:  INP   LOCATION:  0106                         [facility]ACILITY:  [facility]   PHYSICIAN:  Thomas A. Cornett, M.D.DATE OF BIRTH:  05-05-1945   DATE OF ADMISSION:  10/22/2004  DATE OF DISCHARGE:                                HISTORY & PHYSICAL   CHIEF COMPLAINT:  Left lower quadrant pain, nausea, and dehydration.   HISTORY OF PRESENT ILLNESS:  The patient is a 65 year old female now almost  two and a half weeks status post Nissen fundoplication by Dr. Adolph Pollack. She began to weak over the last three to five days and has been  drinking very little. She denies any severe nausea which limits her p.o.  intake. She is passing her urine okay and she states she is moving her  bowels. She also complains of some left upper quadrant pain and also pain  just under her xiphoid. It has been gradual in onset and has been getting  worse over the last 48 hours especially. She feels very weak and very  dehydrated at this point. Nothing seems to make it much worse or much  better, but she does not feel like eating much. She states she was seen  Friday by Dr. Abbey Chatters, but has gotten worse over the weekend.   PAST MEDICAL HISTORY:  Seasonal allergies.   FAMILY HISTORY:  Positive for head and neck cancer in her father and breast  cancer in her mother.   SOCIAL HISTORY:  Negative for tobacco and alcohol use. She is married.   ALLERGIES:  AMOXICILLIN which gives her an upset stomach and ERYTHROMYCIN as  well, CODEINE, and NOVOCAIN.   MEDICATIONS AT HOME:  Include prior to surgery Prevacid which she is off,  Clarinex, Lamisil, and Zantac, at this point which she has stopped as well.   REVIEW OF SYSTEMS:  Ten review of systems is done. CONSTITUTIONAL: Weakness.  HEENT: Normal.  PULMONARY: Normal. CARDIOVASCULAR: Normal. GI: Complaining  of left upper quadrant pain, severe nausea, no  vomiting, no diarrhea.  MUSCULOSKELETAL:  Normal.   PHYSICAL EXAMINATION:  VITAL SIGNS: Temperature 97, pulse 109, blood  pressure 110/50, respirations 20.  GENERAL APPEARANCE: A white female in minimal distress.  HEENT: Extraocular movements are intact. Oropharynx is dry.  NECK: Supple and nontender.  CHEST: Clear.  CARDIOVASCULAR: Regular rate and rhythm.  ABDOMEN: Positive left upper quadrant tenderness to palpation. No masses.  Her incisions from her surgery are healed well.  EXTREMITIES: No clubbing, cyanosis, or edema.   LABORATORY STUDIES:  Chest x-ray reveals a persistent left pleural effusion  and questionable left upper lobe atelectasis versus consolidation. EKG  reveals normal sinus rhythm with tachycardia.   BNP less than 30. BMP reveals sodium of 134, potassium 4, chloride 103, CO2  19, glucose 86, BUN 14, creatinine 0.8, calcium 8.7. CBC shows a white count  of 9000, hemoglobin 11.7, hematocrit 34.6, platelet count 468,000.   IMPRESSION:  Progressive nausea without vomiting and dehydration due to poor  p.o. intake with left upper quadrant pain.  PLAN:  Will admit for IV hydration at this point in time, antiemetic  treatment as well as pain medication. Will obtain a CT scan of her chest,  abdomen, and pelvis. Given her left lower quadrant pain, left lower lobe  consolidation by x-ray, and left pleural effusion. She had some  intraoperative bleeding apparently and would like to exclude a hematoma as  the cause of this. I also would like to get a better look at her chest since  she is  having some discomfort. This may be pleuritic in nature from this  effusion. I also would like to exclude pneumonia as a cause.  Will place her  on Cipro at this point in time 400 mg IV b.i.d., admit her to the hospital,  and obtain the above-mentioned tests. She is overall stable at this point at  this point in time.      Thomas A. Cornett, M.D.  Electronically Signed      TAC/MEDQ  D:  10/22/2004  T:  10/22/2004  Job:  [id number]   cc:   [facility]

## 2010-07-07 NOTE — Op Note (Signed)
[facility]  Patient:    Alice Smith, Alice Smith Visit Number: [id number] MRN: [id number]          Service Type: Attending:  Illene Labrador. Aplington, M.D. Dictated by:   Illene Labrador. Aplington, M.D. Proc. Date: 04/09/01                             Operative Report  PREOPERATIVE DIAGNOSES: 1. Torn rotator cuff, right shoulder. 2. Right carpal tunnel syndrome. 3. Medial epicondylitis, left elbow.  POSTOPERATIVE DIAGNOSES: 1. Torn rotator cuff, right shoulder. 2. Right carpal tunnel syndrome. 3. Medial epicondylitis, left elbow.  OPERATION: 1. Anterior acromionectomy and repair of torn rotator cuff, right shoulder. 2. Right carpal tunnel release. 3. Injection of medial epicondyle with steroid and Marcaine.  SURGEON:  Illene Labrador. Aplington, M.D.  ASSISTANTOttie Glazier. Wynona Neat, P.A.-C.  ANESTHESIA:  General.  PATHOLOGY AND JUSTIFICATION FOR PROCEDURE:  She had positive MRI for a torn rotator cuff, and at surgery was noted to have a 1 cm complete detachment with slight lamination and 1 cm of retraction.  She also had positive nerve conduction studies for carpal tunnel syndrome with severe compression of the median nerve, both at the flexor crease at the wrist and the mid palm.  Just before surgery in the waiting room, she asked me if I would inject her left elbow because of severe pain over the medial epicondylar area.  She had no ulnar nerve symptoms by history, and I consented to do this as part of the surgical procedure today.  DESCRIPTION OF PROCEDURE:  Prophylactic antibiotics, interscalene block by anesthesiologist on the right.  I first handled the carpal tunnel syndrome problem so that we minimized any risks of disrupting the rotator cuff repair. Pneumatic tourniquet was applied.  The arm was elevated and exsanguinated with 275 mmHg.  The right arm from mid forearm to fingertips was prepped with DuraPrep, and draped in the sterile field.  I marked out a  curved incision along the base of the thenar eminence, crossing obliquely over the flexor crease of the wrist and over the distal forearm.  The palmaris longus tendon was identified and retracted ulnarward.  The median nerve was identified beneath it and was found to be very bulbous in appearance, proximal to compression at the flexor crease of the wrist, and protecting the underlying median nerve.  I released the overlying skin, subcutaneous tissue, and very thick fascia in the mid palm.  She had a very prominent vascular pattern in the mid palm which I carefully protected.  Median nerve branches were carefully dissected out and released into the distal palm.  When I felt that the decompression had been completed, I released the tourniquet.  Several minor bleeders were coagulated with bipolar cautery as I had done throughout the case.  The skin and subcutaneous tissue only were then closed with interrupted 4-0 nylon mattress sutures.  Betadine Adaptic and dry sterile dressing with some compression were then applied.  The tourniquet was released.  We then regowned, and prepped and draped the right shoulder girdle, and draped in the sterile field.  Ioban employed.  Vertical incision from the [facility] joint down to the greater tuberosity.  The fascia over the anterior acromion was opened in line with the skin incision, and the deltoid split for about 1 cm, and reflected anteriorly and posteriorly, exposing the [facility] joint anteriorly.  I then undermined the anterior acromion with a small Cobb elevator  followed by a larger Cobb elevator.  With the [facility] needle, I identified the [facility] joint, and marked out the line for initial acromionectomy.  Protecting the underlying rotator cuff, using microsaw, the anterior acromionectomy was performed.  She had a significant amount of impingement, and I then beveled the underneath surface of the anterior acromion with the microsaw, again protecting the underlying  rotator cuff structures until the impingement problem had been corrected.  A partial subdeltoid bursectomy was performed.  The tear was identified and inspected at the underneath surface and found slight retraction of the underneath surface of the fibers in addition of the centimeter of retraction of the entire rotator cuff tear.  I then created a bony trough in the greater tuberosity inside of the tear with a 1/4 inch curved osteotome and small rongeur, and then made two lateral drill holes, and placed a #1 Ethibond suture, reconstituting a slight lamination of the rotator cuff so we had full thickness purchase of the rotator cuff, and with the arm slightly abducted, brought the retractor tear down into this bony groove and securely tied it.  I then supplemented this with multiple #1 Vicryl sutures around the perimeter of the tear to the lateral humeral tissues.  This gave a nice secure repair and the impingement was corrected.  I then irrigated the wound well with sterile saline.  The deltoid muscle was closed in one layer with interrupted #1 Vicryl, and the fascia with a nice tight closure over the anterior acromion was the same.  The subcutaneous tissue was closed with a combination of 2-0 Vicryl and 4-0 Vicryl subcuticularly with Steri-Strips on the skin.  A dry sterile dressing and shoulder immobilizer were applied, but before applying the immobilizer, I placed a volar plaster splint for the carpal tunnel surgery.  I also then injected her medial epicondylar area where I had marked out the area of point tenderness well above the ulnar nerve with 1 cc of Kenalog and 1 cc of 0.5% plain Marcaine following a Betadine prep.  She was then taken to the recovery room in satisfactory condition with no known complications. Dictated by:   Illene Labrador. Aplington, M.D. Attending:  Illene Labrador. Aplington, M.D. DD:  04/09/01 TD:  04/09/01 Job: [id number]

## 2010-07-07 NOTE — Op Note (Signed)
[place]. [facility]  Patient:    Alice Smith, Alice Smith Visit Number: [id number] MRN: [id number]          Service Type: DSU Location: [id number] [phone] Attending Physician:  Marlowe Kays Page Dictated by:   Illene Labrador. Aplington, M.D. Proc. Date: 08/06/01 Admit Date:  08/06/2001 Discharge Date: 08/07/2001                             Operative Report  PREOPERATIVE DIAGNOSES: 1. Chronic impingement syndrome with partial rotator cuff tear, left shoulder. 2. Left carpal tunnel syndrome. 3. Medial epicondylitis, left elbow.  POSTOPERATIVE DIAGNOSES: 1. Chronic impingement syndrome with partial rotator cuff tear, left shoulder. 2. Left carpal tunnel syndrome. 3. Medial epicondylitis, left elbow.  OPERATION: 1. Left shoulder arthroscopy (normal examination with arthroscopic subacromial    decompression). 2. Carpal tunnel release, left wrist and hand. 3. Injection of medial elbow with steroid and Marcaine.  SURGEON:  Illene Labrador. Aplington, M.D.  ASSISTANT:  Clarene Reamer, P.A.-C for procedure #1.  ANESTHESIA:  General.  INDICATIONS FOR PROCEDURE:  She has had an open rotator cuff repair for full-thickness tear in the right shoulder, as well as right carpal tunnel release on the right, and has done well with both.  MRI demonstrates partial rotator cuff tear only on the left.  She has some degenerative changes at the [facility] joint without tenderness.  She also has carpal tunnel syndrome on the left, documented with nerve conduction studies.  She also has chronic medial epicondylitis, left elbow, and requests a steroid injection.  DESCRIPTION OF PROCEDURE:  Satisfactory general anesthesia, she was placed on a ______ frame, but in the supine position flat for carpal tunnel surgery initially.  A pneumatic tourniquet applied, and the left upper extremity from the mid forearm fingertips was prepped with Duraprep and draped as a sterile field.  A carpal tunnel incision  was made over the distal wrist, crossing obliquely over the flexor crease of the wrist, and under the distal palm.  The median nerve was identified at the wrist, and there was compression proximal to the wrist and just distal to the transverse flexor crease.  The skin, subcutaneous tissue, and fascia were progressively released into the distal palm, protecting the median nerve and dissecting out with a small hemostat the neurovascular branches.  When the decompression had been completed, the wound was thoroughly irrigated with sterile saline.  Along the way, potential bleeders were coagulated with bipolar cautery.  The skin and subcutaneous tissue were then closed with interrupted 4-0 nylon mattress sutures.  The wound was then infiltrated with 0.50% plain Marcaine, and dry sterile dressing were applied.  I then injected the previously determined area for the medial epicondylar area with 1 cc of 0.50% plain Marcaine and 80 mg of Depo-Medrol. Following this, Mr. Mayford Knife and I re-prepped ourselves and the left shoulder girdle was prepped with Duraprep, and we draped it into a sterile field.  The anatomy of the shoulder joint was marked out.  A lateral portal, posterior soft spot portal, and the subcoronal space were all injected with 0.50% Marcaine with Adrenalin.  Through the posterior soft spot portal I then made an atraumatic entrance into the glenohumeral joint which looked normal on examination.  I then redirected the scope into the subacromial area, and through a lateral port introduced a 4.2 shaver.  She had a good bit of subacromial bursitis which I resected with the 4.2 shaver.  The rotator cuff was frayed on the superior surface, and I smoothed this down as well.  I took preliminary pictures of the subacromial prominence, and I then used the ______ vaporizer to remove soft tissue from the underneath surface of the acromion and around the perimeter, including the coracoacromial  ligament back to the [facility] joint.  I then introduced a 4-0 oval burr, and began burring down the underneath surface of the acromion and the distal clavicle.  I then went back and forth between the burr, the vaporizer, and the 4.2 shaver until I felt we had cleared all soft tissue and any bone that might be impinging on the underlying rotator cuff.  This was documented with pictures with the arm to her side and the arm abductor with vaporizer in place.  At the conclusion of the case, there was no unusual bleeding.  All fluid and pus was removed. The subacromial space and two portals on the skin injected with 0.50% Marcaine with adrenalin.  The two portals were closed with 4-0 nylon.  Adaptic, dry sterile dressing, and shoulder immobilizer were applied.  I then placed a plaster splint and an Ace wrap to complete the carpal tunnel surgery.  She was taken to the recovery room in satisfactory condition with no known complications.Dictated by:   Illene Labrador. Aplington, M.D. Attending Physician:  Joaquin Courts DD:  08/06/01 TD:  08/07/01 Job: [id number]

## 2010-07-07 NOTE — Discharge Summary (Signed)
Alice Smith, Alice Smith              ACCOUNT NO.:  [id number]   MEDICAL RECORD NO.:  [id number]          PATIENT TYPE:  INP   LOCATION:  1612                         FACILITY:  [facility]   PHYSICIAN:  Maisie Fus A. Cornett, M.D.DATE OF BIRTH:  06-17-1945   DATE OF ADMISSION:  10/22/2004  DATE OF DISCHARGE:  10/28/2004                                 DISCHARGE SUMMARY   ADMISSION DIAGNOSES:  1.  Postop nausea secondary to Nissen fundoplication .  2.  Left pleural effusion.  3.  Perisplenic fluid collection.   DISCHARGE DIAGNOSES:  1.  Postop nausea secondary to Nissen fundoplication .  2.  Left pleural effusion.  3.  Perisplenic fluid collection.   PROCEDURE:  Left thoracentesis.   BRIEF HISTORY:  The patient is a 65 year old female readmitted to the  hospital on October 22, 2004 about two weeks after undergoing a  laparoscopic Nissen fundoplication  by Dr. Abbey Chatters.  She was admitted  with persistent nausea and failure to thrive. She was admitted and worked up  and found to have a large pleural effusion and a perisplenic fluid  collection. She was admitted for I antibiotics, IV fluids, hydration.   HOSPITAL COURSE:  The patient was readmitted and hydrated. She continued to  have nausea and had a large left pleural effusion which was tapped on two  separate occasions, the first being October 24, 2004, the second being  October 27, 2004 due to reaccumulation of fluid. She felt better after the  fluid was drained on both occasions and her nausea improved. Her white count  normalized and her diet was slowly advance. She was placed on Reglan and  Phenergan, this seemed to help her nausea as well. She had an upper GI study  which showed no evidence of leak or slippage of her wrap. She was discharged  home on October 28, 2004 tolerating a very soft diet.   DISCHARGE INSTRUCTIONS:  The patient will followup with Dr. Abbey Chatters  within 1-2 weeks of discharge. She will call to make that  appointment. She  will have a repeat chest x-ray to make sure her pleural effusion is resolved  and was placed on a soft diet.   DISCHARGE MEDICATIONS:  Reglan and Phenergan for nausea.   CONDITION ON DISCHARGE:  Satisfactory.      Thomas A. Cornett, M.D.  Electronically Signed    TAC/MEDQ  D:  11/08/2004  T:  11/09/2004  Job:  [id number]

## 2010-07-07 NOTE — H&P (Signed)
Alice Smith, Alice Smith              ACCOUNT NO.:  [id number]   MEDICAL RECORD NO.:  [id number]          PATIENT TYPE:  AMB   LOCATION:  DAY                          FACILITY:  [facility]   PHYSICIAN:  Adolph Pollack, M.D.DATE OF BIRTH:  [birth year]   DATE OF ADMISSION:  10/05/2004  DATE OF DISCHARGE:                                HISTORY & PHYSICAL   REASON FOR ADMISSION:  Laparoscopic Nissan fundoplication and excision of  left forehead soft tissue mass.   HISTORY OF PRESENT ILLNESS:  Ms. Ghrist is a 65 year old female who I first  saw back in February 2006. She had been having problems with coughing,  hoarseness, and a globus sensation in the throat and was discovered to have  acid reflux. This was verified by way of 24-hour pH study. She had upper  endoscopy showing no masses. She had manometry which was normal. Despite  maximum medical therapy she continued to have problems. She came back and  saw me in June and wanted to proceed with elective surgery. She also wants a  soft tissue mass removed from her left forehead. We have gone over the  procedure and the risks extensively preoperatively.   PAST MEDICAL HISTORY:  1.  Gastroesophageal reflux disease.  2.  Asthma.  3.  Irritable bowel syndrome.  4.  Chronic rhinitis.  5.  Left breast mass which was benign.   PREVIOUS OPERATIONS:  1.  Left breast biopsy.  2.  Hysterectomy.  3.  Rotator cuff repair.   ALLERGIES:  NOVOCAIN, CODEINE, and an intolerance to AMOXICILLIN.   CURRENT MEDICATIONS:  Include Prevacid, Clarinex, Lamasil, Zantac,  Rhinocort, Tylenol p.r.n., Zomig p.r.n. Visine p.r.n.   SOCIAL HISTORY:  She is married and her husband is with her today. No  tobacco or alcohol use.   FAMILY HISTORY:  Positive for head and neck cancer in her father. Her  maternal grandmother died from breast cancer.   PHYSICAL EXAMINATION:  GENERAL:  A slightly anxious female but she is in no  acute distress. She is very pleasant and  cooperative.  SKIN:  Warm, dry, no jaundice.  EYES:  Extraocular motions intact, no icterus.  NECK:  Supple without masses, thyroid is not enlarged  RESPIRATORY:  Breath sounds equal and clear, respirations unlabored.  CARDIOVASCULAR:  Demonstrates regular rate and rhythm. No JVD, no murmur  heard.  ABDOMEN:  Soft. Lower transverse scar noted. No hepatomegaly or  splenomegaly. No masses and no tenderness.  MUSCULOSKELETAL:  Good range of motion. PAS hose are on her legs.   IMPRESSION:  Medically-refractory gastroesophageal reflux disease. Also has  a painful soft tissue mass measuring 1 cm left forehead.   PLAN:  Proceed with laparoscopic Nissan fundoplication and excision of soft  tissue mass in the left forehead.      Adolph Pollack, M.D.  Electronically Signed     TJR/MEDQ  D:  10/05/2004  T:  10/05/2004  Job:  [id number]

## 2010-07-21 ENCOUNTER — Other Ambulatory Visit: Payer: Self-pay | Admitting: Family Medicine

## 2010-07-21 NOTE — Telephone Encounter (Signed)
Okay to refill? 

## 2010-07-21 NOTE — Telephone Encounter (Signed)
Sent by MD. 

## 2010-08-07 ENCOUNTER — Other Ambulatory Visit: Payer: Self-pay | Admitting: *Deleted

## 2010-08-07 DIAGNOSIS — R05 Cough: Secondary | ICD-10-CM

## 2010-08-07 MED ORDER — HYDROCOD POLST-CHLORPHEN POLST 10-8 MG/5ML PO LQCR: 5.0000 mL | Freq: Every evening | ORAL | Status: DC | PRN

## 2010-08-07 NOTE — Telephone Encounter (Signed)
Please call in

## 2010-08-07 NOTE — Telephone Encounter (Signed)
Rx called to pharmacy

## 2010-08-17 ENCOUNTER — Telehealth: Payer: Self-pay | Admitting: *Deleted

## 2010-08-17 NOTE — Telephone Encounter (Signed)
Pt called to let you know that her headaches are better with the toprol.  She took one half pill a day times one week and then increased to a whole pill.  She had one bad headache when taking the half pill but none since starting the whole.

## 2010-08-17 NOTE — Telephone Encounter (Signed)
Good.  Continue as is an let me know if she has concerns.  Thanks.

## 2010-08-18 NOTE — Telephone Encounter (Signed)
Patient advised as instructed via telephone. 

## 2010-09-19 ENCOUNTER — Other Ambulatory Visit: Payer: Self-pay | Admitting: Family Medicine

## 2010-09-20 ENCOUNTER — Other Ambulatory Visit: Payer: Self-pay | Admitting: *Deleted

## 2010-10-25 ENCOUNTER — Other Ambulatory Visit: Payer: Self-pay | Admitting: *Deleted

## 2010-10-25 MED ORDER — HYDROCOD POLST-CHLORPHEN POLST 10-8 MG/5ML PO LQCR: 5.0000 mL | Freq: Every evening | ORAL | Status: DC | PRN

## 2010-10-25 NOTE — Telephone Encounter (Signed)
Please call in

## 2010-10-26 NOTE — Telephone Encounter (Signed)
Medication phoned to pharmacy by [facility].

## 2010-12-18 ENCOUNTER — Other Ambulatory Visit: Payer: Self-pay | Admitting: *Deleted

## 2010-12-18 MED ORDER — HYDROCOD POLST-CHLORPHEN POLST 10-8 MG/5ML PO LQCR: 5.0000 mL | Freq: Every evening | ORAL | Status: DC | PRN

## 2010-12-18 NOTE — Telephone Encounter (Signed)
Medication phoned to pharmacy.  

## 2010-12-18 NOTE — Telephone Encounter (Signed)
Please call in

## 2010-12-18 NOTE — Telephone Encounter (Signed)
Pt request refill of her tussionex, has had a really bad cough and used the last of it. She only takes at night prn.

## 2011-01-02 ENCOUNTER — Other Ambulatory Visit: Payer: Self-pay | Admitting: Family Medicine

## 2011-01-02 NOTE — Telephone Encounter (Signed)
Received refill request electronically from pharmacy. Is it okay to refill medication? 

## 2011-01-03 ENCOUNTER — Other Ambulatory Visit: Payer: Self-pay | Admitting: *Deleted

## 2011-01-03 NOTE — Telephone Encounter (Signed)
rx already sent. 

## 2011-01-03 NOTE — Telephone Encounter (Signed)
Received faxed refill request from pharmacy. Directions on request reads take 1 tablet by mouth three times a day as needed for nausea/vomiting, sedation caution. Is it okay to refill medication?

## 2011-01-10 ENCOUNTER — Other Ambulatory Visit: Payer: Self-pay | Admitting: Family Medicine

## 2011-01-15 ENCOUNTER — Encounter: Payer: Self-pay | Admitting: Family Medicine

## 2011-01-15 ENCOUNTER — Ambulatory Visit (INDEPENDENT_AMBULATORY_CARE_PROVIDER_SITE_OTHER): Payer: BC Managed Care – PPO | Admitting: Family Medicine

## 2011-01-15 DIAGNOSIS — R05 Cough: Secondary | ICD-10-CM

## 2011-01-15 DIAGNOSIS — G43009 Migraine without aura, not intractable, without status migrainosus: Secondary | ICD-10-CM

## 2011-01-15 MED ORDER — AZITHROMYCIN 250 MG PO TABS: ORAL_TABLET | ORAL | Status: AC

## 2011-01-15 NOTE — Progress Notes (Signed)
Migraine are much better in terms of frequency.  Still with occ severe HA.  See instructions.    She had an illness that was getting better but got sick again over the holiday.  Since the weekend, she's had diarrhea and abd pain.  H/o hemorrhoids and had some BRBPR.  She used some prep H suppositories with relief.  Some occ cough with green/yellow sputum.  Some fevers.   Not sob.    Rhinorrhea: yes Congestion: yes ear pain: no sore throat:yes  Cough:yes Myalgias:yes prev, some better now. Fatigue noted.   ROS: See HPI.  Otherwise negative.    Meds, vitals, and allergies reviewed.   GEN: nad, alert and oriented HEENT: mucous membranes moist, TM w/o erythema, nasal epithelium injected, OP with cobblestoning NECK: supple w/o LA CV: rrr. PULM: ctab, no inc wob, occ scattered ronchi, cleared with cough, no wheeze and no focal dec in BS ABD: soft, +bs EXT: no edema

## 2011-01-15 NOTE — Assessment & Plan Note (Signed)
Likely bronchitis, nontoxic and okay for outpatient f/u.  ddx d/w pt.  Likely viral.  I would give this longer to self resolved.  Hold abx.  She was able to tolerate zmax prev.  Start this if sx continue.  She agrees.

## 2011-01-15 NOTE — Patient Instructions (Addendum)
As long as your BP is not low (not dizzy on standing) and your pulse is 60 or above, then you can increase the toprol to 1.5 tab a day for 2 weeks, then up to 2 tabs a day.  See if that helps with the migraines.   Take the tussionex, try to get some rest, drink plenty of fluids. If the cough gets worse in the next few days, then start the antibiotics.

## 2011-01-15 NOTE — Assessment & Plan Note (Signed)
Can gradually inc BB after acute illness is resolved .

## 2011-01-19 ENCOUNTER — Telehealth: Payer: Self-pay | Admitting: *Deleted

## 2011-01-19 MED ORDER — HYDROCOD POLST-CHLORPHEN POLST 10-8 MG/5ML PO LQCR: 5.0000 mL | Freq: Every evening | ORAL | Status: DC | PRN

## 2011-01-19 NOTE — Telephone Encounter (Signed)
Pt seen earlier this week and request refill for tussionex, she says she needs it before the weekend.

## 2011-01-19 NOTE — Telephone Encounter (Signed)
Please call in

## 2011-01-19 NOTE — Telephone Encounter (Signed)
Medication phoned to pharmacy.  

## 2011-02-08 ENCOUNTER — Telehealth: Payer: Self-pay | Admitting: *Deleted

## 2011-02-08 MED ORDER — METOPROLOL SUCCINATE ER 25 MG PO TB24: ORAL_TABLET | ORAL | Status: DC

## 2011-02-08 NOTE — Telephone Encounter (Signed)
Pt called stating she is doing fine on 2 tabs of Toprol daily, needed refill sent in. I changed rx and sent to pharmacy.

## 2011-02-08 NOTE — Telephone Encounter (Signed)
Noted  

## 2011-02-12 ENCOUNTER — Telehealth: Payer: Self-pay | Admitting: *Deleted

## 2011-02-12 MED ORDER — OSELTAMIVIR PHOSPHATE 75 MG PO CAPS: 75.0000 mg | ORAL_CAPSULE | Freq: Every day | ORAL | Status: AC

## 2011-02-12 NOTE — Telephone Encounter (Signed)
Spoke with patient. Children tested + yesterday. She currently has no symptoms and has had flu vaccine, but would like prophylactic treatment if possible.

## 2011-02-12 NOTE — Telephone Encounter (Signed)
Per Dr. Patsy Lager- Tamiflu 75 mg 1 PO QD x 10 days. # 10; 0 refills. Rx sent to pharmacy as directed.

## 2011-02-12 NOTE — Telephone Encounter (Signed)
Pt's 3 children tested positive for flu at urgent care request rx for Tamiflu called in if possible.  

## 2011-02-12 NOTE — Telephone Encounter (Signed)
More information needed. Symptoms?

## 2011-02-27 ENCOUNTER — Other Ambulatory Visit: Payer: Self-pay | Admitting: Internal Medicine

## 2011-02-27 NOTE — Telephone Encounter (Signed)
Patient called and stated she would like a refill on her Tussinex for coughing.

## 2011-02-28 MED ORDER — HYDROCOD POLST-CHLORPHEN POLST 10-8 MG/5ML PO LQCR: 5.0000 mL | Freq: Every evening | ORAL | Status: DC | PRN

## 2011-02-28 NOTE — Telephone Encounter (Signed)
Please call in.  Thanks.   

## 2011-02-28 NOTE — Telephone Encounter (Signed)
Medication phoned to pharmacy.  

## 2011-03-01 ENCOUNTER — Other Ambulatory Visit: Payer: Self-pay | Admitting: Family Medicine

## 2011-03-01 DIAGNOSIS — Z1231 Encounter for screening mammogram for malignant neoplasm of breast: Secondary | ICD-10-CM

## 2011-03-21 ENCOUNTER — Ambulatory Visit
Admission: RE | Admit: 2011-03-21 | Discharge: 2011-03-21 | Disposition: A | Discharge status: Home / Self Care | Payer: BC Managed Care – PPO | Source: Ambulatory Visit | Attending: Family Medicine | Admitting: Family Medicine

## 2011-03-21 DIAGNOSIS — Z1231 Encounter for screening mammogram for malignant neoplasm of breast: Secondary | ICD-10-CM

## 2011-03-26 ENCOUNTER — Encounter: Payer: Self-pay | Admitting: *Deleted

## 2011-04-04 ENCOUNTER — Telehealth: Payer: Self-pay | Admitting: Family Medicine

## 2011-04-04 MED ORDER — HYDROCOD POLST-CHLORPHEN POLST 10-8 MG/5ML PO LQCR: 5.0000 mL | Freq: Every evening | ORAL | Status: DC | PRN

## 2011-04-04 NOTE — Telephone Encounter (Signed)
Pt called, would like Rx for Tussinex 5 mg.  Returned patient call, informed her she would need an office visit per nurse..Apolinar Junes

## 2011-04-04 NOTE — Telephone Encounter (Signed)
Rx called to pharmacy as instructed. Patient notified as instructed by telephone. 

## 2011-04-04 NOTE — Telephone Encounter (Signed)
Call this in.  She doesn't need a visit for this right now.  I would schedule a visit for the spring, but not right now (unless she has a problem that needs addressing now).

## 2011-04-06 ENCOUNTER — Ambulatory Visit: Payer: BC Managed Care – PPO | Admitting: Family Medicine

## 2011-04-16 ENCOUNTER — Other Ambulatory Visit: Payer: Self-pay | Admitting: Family Medicine

## 2011-04-16 NOTE — Telephone Encounter (Signed)
Received refill request electronically from pharmacy. Is it okay to refill medication? 

## 2011-04-17 NOTE — Telephone Encounter (Signed)
Sent!

## 2011-05-07 ENCOUNTER — Other Ambulatory Visit: Payer: Self-pay | Admitting: *Deleted

## 2011-05-07 MED ORDER — RALOXIFENE HCL 60 MG PO TABS: 60.0000 mg | ORAL_TABLET | Freq: Every day | ORAL | Status: DC

## 2011-05-07 NOTE — Telephone Encounter (Signed)
Received faxed refill request from pharmacy. Refill sent to pharmacy electronically. 

## 2011-05-09 ENCOUNTER — Other Ambulatory Visit: Payer: Self-pay | Admitting: Family Medicine

## 2011-05-10 NOTE — Telephone Encounter (Signed)
Received refill request electronically from pharmacy. Is it okay to refill medication? 

## 2011-05-10 NOTE — Telephone Encounter (Signed)
Sent!

## 2011-05-14 ENCOUNTER — Other Ambulatory Visit: Payer: Self-pay

## 2011-05-14 MED ORDER — HYDROCOD POLST-CHLORPHEN POLST 10-8 MG/5ML PO LQCR: 5.0000 mL | Freq: Every evening | ORAL | Status: DC | PRN

## 2011-05-14 NOTE — Telephone Encounter (Signed)
Pt request written rx for Tussionex for chronic dry cough due to sensitive airways. Pt last seen 01/15/11.Please advise.  Pt can be reached at [phone] when rx ready for pick up.

## 2011-05-14 NOTE — Telephone Encounter (Signed)
Printed.  Please have her pick up.  Thanks.

## 2011-05-14 NOTE — Telephone Encounter (Signed)
Pt called back to see if med could be called in to [facility] and I explained would need to pick up written rx and nurse will call when rx is ready.

## 2011-05-15 NOTE — Telephone Encounter (Signed)
Patient notified as instructed by telephone. Pt now request rx called to [facility]. Medication phoned to [facility] as instructed.

## 2011-06-22 ENCOUNTER — Other Ambulatory Visit: Payer: Self-pay

## 2011-06-22 NOTE — Telephone Encounter (Signed)
Pt request refill Tussionex cough med called to [facility]. Pt has sensitive airways and takes 1 tsp every night for cough. Pt only has 1 tsp left. Pt request call back at [phone] when rx called in. Pt will call next week for CPX appt.

## 2011-06-24 MED ORDER — HYDROCOD POLST-CHLORPHEN POLST 10-8 MG/5ML PO LQCR: 5.0000 mL | Freq: Every evening | ORAL | Status: DC | PRN

## 2011-06-24 NOTE — Telephone Encounter (Signed)
Please call in

## 2011-06-25 NOTE — Telephone Encounter (Signed)
Rx called to pharmacy as instructed. 

## 2011-06-30 ENCOUNTER — Other Ambulatory Visit: Payer: Self-pay | Admitting: Family Medicine

## 2011-07-09 ENCOUNTER — Other Ambulatory Visit: Payer: Self-pay | Admitting: Family Medicine

## 2011-07-09 DIAGNOSIS — M899 Disorder of bone, unspecified: Secondary | ICD-10-CM

## 2011-07-10 NOTE — Telephone Encounter (Signed)
It looks like the patient was warned with the last refill to schedule an OV.  No appts scheduled.  Please advise.

## 2011-07-11 NOTE — Telephone Encounter (Signed)
Please notify pt.  I denied the rx.  She's been on evista since at least 2008, would rec: coming off the med and rechecking DXA as last test was done ~2 years ago.  Thanks.  Order is in for DXA.

## 2011-07-11 NOTE — Telephone Encounter (Signed)
Patient notified as instructed by telephone. Advised patient that Alice Smith will be in touch with her to get the test scheduled.

## 2011-08-07 ENCOUNTER — Other Ambulatory Visit: Payer: Self-pay | Admitting: Family Medicine

## 2011-08-07 NOTE — Telephone Encounter (Signed)
Electronic refill request.  Last filled 04/17/11 for #30 / 3RF

## 2011-08-07 NOTE — Telephone Encounter (Signed)
Sent!

## 2011-08-09 ENCOUNTER — Other Ambulatory Visit: Payer: Medicare Other

## 2011-08-09 ENCOUNTER — Other Ambulatory Visit: Payer: Self-pay

## 2011-08-09 NOTE — Telephone Encounter (Signed)
Pt left v/m requesting Tussionex to [facility]. When filled 06/22/11 pt takes nightly due to sensitive airway. Pt already scheduled CPX 10/12/11. Pt request call back.

## 2011-08-10 MED ORDER — HYDROCOD POLST-CHLORPHEN POLST 10-8 MG/5ML PO LQCR: 5.0000 mL | Freq: Every evening | ORAL | Status: DC | PRN

## 2011-08-10 NOTE — Telephone Encounter (Signed)
Pleas call in.  

## 2011-08-10 NOTE — Telephone Encounter (Signed)
Medication phoned to pharmacy. Patient advised.  

## 2011-08-16 ENCOUNTER — Ambulatory Visit (INDEPENDENT_AMBULATORY_CARE_PROVIDER_SITE_OTHER)
Admission: RE | Admit: 2011-08-16 | Discharge: 2011-08-16 | Disposition: A | Discharge status: Home / Self Care | Payer: Medicare Other | Source: Ambulatory Visit | Attending: Family Medicine | Admitting: Family Medicine

## 2011-08-16 DIAGNOSIS — M899 Disorder of bone, unspecified: Secondary | ICD-10-CM

## 2011-08-20 ENCOUNTER — Encounter: Payer: Self-pay | Admitting: *Deleted

## 2011-09-05 ENCOUNTER — Other Ambulatory Visit: Payer: Self-pay | Admitting: Family Medicine

## 2011-09-05 NOTE — Telephone Encounter (Signed)
Received refill request electronically from pharmacy. Last office visit was11/26/12. Is it okay to refill medication?

## 2011-09-05 NOTE — Telephone Encounter (Signed)
Sent!

## 2011-09-18 ENCOUNTER — Other Ambulatory Visit: Payer: Self-pay

## 2011-09-18 MED ORDER — HYDROCOD POLST-CHLORPHEN POLST 10-8 MG/5ML PO LQCR: 5.0000 mL | Freq: Every evening | ORAL | Status: DC | PRN

## 2011-09-18 NOTE — Telephone Encounter (Signed)
Please call in

## 2011-09-18 NOTE — Telephone Encounter (Signed)
Pt left v/m going on vacation Fri and will be out of med Sun. Pt reques refill called to [facility].Please advise.

## 2011-09-19 NOTE — Telephone Encounter (Signed)
Rx called in 

## 2011-09-28 ENCOUNTER — Telehealth: Payer: Self-pay

## 2011-09-28 DIAGNOSIS — E7800 Pure hypercholesterolemia, unspecified: Secondary | ICD-10-CM

## 2011-09-28 DIAGNOSIS — R300 Dysuria: Secondary | ICD-10-CM

## 2011-09-28 DIAGNOSIS — M8580 Other specified disorders of bone density and structure, unspecified site: Secondary | ICD-10-CM

## 2011-09-28 NOTE — Telephone Encounter (Signed)
Pt left v/m has lab appt Mon 08/12 at 8:55 am; pt wants to leave urine specimen at lab appt. Urine has odor,burns when urinates. Pt has CPX scheduled 10/12/11. Pt request call back.Please advise.

## 2011-09-30 NOTE — Telephone Encounter (Signed)
Late entry.  I had called patient Friday.  Okay to leave urine sample on Monday. If sx are worse in the meantime, then she can go to UC.  She agrees.

## 2011-10-01 ENCOUNTER — Ambulatory Visit: Payer: Medicare Other

## 2011-10-01 ENCOUNTER — Other Ambulatory Visit: Payer: Self-pay | Admitting: Family Medicine

## 2011-10-01 ENCOUNTER — Other Ambulatory Visit (INDEPENDENT_AMBULATORY_CARE_PROVIDER_SITE_OTHER): Payer: Medicare Other

## 2011-10-01 DIAGNOSIS — E7800 Pure hypercholesterolemia, unspecified: Secondary | ICD-10-CM

## 2011-10-01 DIAGNOSIS — R300 Dysuria: Secondary | ICD-10-CM

## 2011-10-01 DIAGNOSIS — M8580 Other specified disorders of bone density and structure, unspecified site: Secondary | ICD-10-CM

## 2011-10-01 LAB — COMPREHENSIVE METABOLIC PANEL
BUN: 14 mg/dL (ref 6–23)
CO2: 28 mEq/L (ref 19–32)
Creatinine, Ser: 0.7 mg/dL (ref 0.4–1.2)
GFR: 90.37 mL/min (ref >60.00)
Glucose, Bld: 84 mg/dL (ref 70–99)
Total Bilirubin: 0.7 mg/dL (ref 0.3–1.2)

## 2011-10-01 LAB — POCT URINALYSIS DIPSTICK
Glucose, UA: NEGATIVE
Nitrite, UA: NEGATIVE
Spec Grav, UA: 1.015
Urobilinogen, UA: NEGATIVE

## 2011-10-01 LAB — LIPID PANEL
Cholesterol: 212 mg/dL — ABNORMAL HIGH (ref 0–200)
Triglycerides: 113 mg/dL (ref 0.0–149.0)

## 2011-10-01 MED ORDER — CIPROFLOXACIN HCL 250 MG PO TABS: 250.0000 mg | ORAL_TABLET | Freq: Two times a day (BID) | ORAL | Status: DC

## 2011-10-02 LAB — VITAMIN D 25 HYDROXY (VIT D DEFICIENCY, FRACTURES): Vit D, 25-Hydroxy: 54 ng/mL (ref 30–89)

## 2011-10-03 LAB — URINE CULTURE

## 2011-10-08 ENCOUNTER — Other Ambulatory Visit: Payer: BC Managed Care – PPO

## 2011-10-08 ENCOUNTER — Telehealth: Payer: Self-pay | Admitting: *Deleted

## 2011-10-08 NOTE — Telephone Encounter (Signed)
I'll await the form/hardcopy.

## 2011-10-08 NOTE — Telephone Encounter (Signed)
Patient left a message saying brace company will be sending paperwork for 2 knee braces and 1 back brace and she does want these. Company name [employer]. Patient says if you want to wait until she comes in on Friday that is fine to.

## 2011-10-11 NOTE — Telephone Encounter (Signed)
Renee with [employer] left v/m for status of faxed form for back and knee brace;phone [phone] and fax [phone].Please advise.

## 2011-10-11 NOTE — Telephone Encounter (Signed)
Patient has OV on 10/12/11.

## 2011-10-12 ENCOUNTER — Ambulatory Visit (INDEPENDENT_AMBULATORY_CARE_PROVIDER_SITE_OTHER): Payer: Medicare Other | Admitting: Family Medicine

## 2011-10-12 ENCOUNTER — Telehealth: Payer: Self-pay | Admitting: *Deleted

## 2011-10-12 ENCOUNTER — Encounter: Payer: Self-pay | Admitting: Family Medicine

## 2011-10-12 VITALS — BP 140/80 | HR 87 | Temp 98.2°F | Ht 62.5 in | Wt 166.0 lb

## 2011-10-12 DIAGNOSIS — M949 Disorder of cartilage, unspecified: Secondary | ICD-10-CM

## 2011-10-12 DIAGNOSIS — G43009 Migraine without aura, not intractable, without status migrainosus: Secondary | ICD-10-CM

## 2011-10-12 DIAGNOSIS — Z23 Encounter for immunization: Secondary | ICD-10-CM

## 2011-10-12 DIAGNOSIS — R059 Cough, unspecified: Secondary | ICD-10-CM

## 2011-10-12 DIAGNOSIS — Z0000 Encounter for general adult medical examination without abnormal findings: Secondary | ICD-10-CM

## 2011-10-12 DIAGNOSIS — R05 Cough: Secondary | ICD-10-CM

## 2011-10-12 DIAGNOSIS — M899 Disorder of bone, unspecified: Secondary | ICD-10-CM

## 2011-10-12 MED ORDER — RALOXIFENE HCL 60 MG PO TABS: 60.0000 mg | ORAL_TABLET | Freq: Every day | ORAL | Status: DC

## 2011-10-12 NOTE — Progress Notes (Signed)
I have personally reviewed the Medicare Annual Wellness questionnaire and have noted 1. The patient's medical and social history 2. Their use of alcohol, tobacco or illicit drugs 3. Their current medications and supplements 4. The patient's functional ability including ADL's, fall risks, home safety risks and hearing or visual             impairment. 5. Diet and physical activities 6. Evidence for depression or mood disorders  The patients weight, height, BMI have been recorded in the chart and visual acuity is per eye clinic.  I have made referrals, counseling and provided education to the patient based review of the above and I have provided the pt with a written personalized care plan for preventive services.  See scanned forms.  Routine anticipatory guidance given to patient.  See health maintenance. Tetanus 2005 Flu yearly Shingles 2008 PNA 2013 Colonoscopy prev done ,2009 Breast cancer screening at the breast center Advance directive.  Daughter would be designated per patient request.  Pap not indicated s/p hysterectomy.   UTI sx resolved.  Feeling well from that.    Migraines much improved with current meds. Minimal and rare sx.  This is a sig change for patient.  No ADE from meds.   Osteoporosis. On raloxifene.  D/w pt about risk/benefit. The patient has a family history of breast cancer and would like to continue until she can check with coverage for BRCA testing.  Would be reasonable to recheck DXA in 2-3 years.   Cough continues, controlled with current meds, tussionex.  Prev with thorough w/u.  No new sx.  Does well with current meds.    PMH and SH reviewed  Meds, vitals, and allergies reviewed.   ROS: See HPI.  Otherwise negative.    GEN: nad, alert and oriented HEENT: mucous membranes moist NECK: supple w/o LA CV: rrr. PULM: ctab, no inc wob ABD: soft, +bs EXT: no edema SKIN: no acute rash Breast exam: No mass, nodules, thickening, tenderness, bulging,  retraction, inflamation, nipple discharge or skin changes noted.  No axillary or clavicular LA.  Chaperoned exam.  Normal introitus for age, no external lesions, no vaginal discharge, mucosa pink and moist, no vaginal lesions, no vaginal atrophy, no friaility or hemorrhage, cervix and uterus absent, no adnexal masses or tenderness.  Chaperoned exam.

## 2011-10-12 NOTE — Patient Instructions (Addendum)
I would get a flu shot each fall.   Ask the insurance company about the breast cancer genetic testing (BRCA testing). Take care.  Glad to see you.

## 2011-10-12 NOTE — Telephone Encounter (Signed)
The form that Ms. Clerk alerted [place] of earlier in the week has arrived and in your in box.

## 2011-10-14 DIAGNOSIS — Z0000 Encounter for general adult medical examination without abnormal findings: Secondary | ICD-10-CM | POA: Insufficient documentation

## 2011-10-14 NOTE — Assessment & Plan Note (Signed)
Controlled, continue current meds.   

## 2011-10-14 NOTE — Assessment & Plan Note (Signed)
Continue evista for now.  D/w pt about risk and benefits and should would like to continue.  No personal or FH of clotting disorder.

## 2011-10-14 NOTE — Assessment & Plan Note (Signed)
Much improved with current meds, continue as is.  She agrees.  She's happy with current level of control.

## 2011-10-14 NOTE — Telephone Encounter (Signed)
She declined the equipment. We discussed at the visit.

## 2011-10-14 NOTE — Assessment & Plan Note (Signed)
  See scanned forms.  Routine anticipatory guidance given to patient.  See health maintenance. Tetanus 2005 Flu yearly Shingles 2008 PNA 2013 Colonoscopy prev done ,2009 Breast cancer screening at the breast center Advance directive.  Daughter would be designated per patient request.  Pap not indicated s/p hysterectomy.

## 2011-11-01 ENCOUNTER — Other Ambulatory Visit: Payer: Self-pay

## 2011-11-01 NOTE — Telephone Encounter (Signed)
Pt left v/m requesting refill Tussionex to [facility]. Pt request call back when refilled.

## 2011-11-02 MED ORDER — HYDROCOD POLST-CHLORPHEN POLST 10-8 MG/5ML PO LQCR: 5.0000 mL | Freq: Every evening | ORAL | Status: DC | PRN

## 2011-11-02 NOTE — Telephone Encounter (Signed)
Please call in

## 2011-11-02 NOTE — Telephone Encounter (Signed)
Alice Smith said pt called back about status of refill; Alice Smith told pt was [facility] now. Medication phoned to  [facility] as instructed.

## 2011-12-16 ENCOUNTER — Other Ambulatory Visit: Payer: Self-pay | Admitting: Family Medicine

## 2011-12-17 NOTE — Telephone Encounter (Signed)
Electronic refill request

## 2011-12-17 NOTE — Telephone Encounter (Signed)
Sent!

## 2011-12-31 ENCOUNTER — Ambulatory Visit (INDEPENDENT_AMBULATORY_CARE_PROVIDER_SITE_OTHER): Payer: Medicare Other | Admitting: Family Medicine

## 2011-12-31 ENCOUNTER — Encounter: Payer: Self-pay | Admitting: Family Medicine

## 2011-12-31 VITALS — BP 132/78 | HR 94 | Temp 98.3°F | Wt 172.0 lb

## 2011-12-31 DIAGNOSIS — G43009 Migraine without aura, not intractable, without status migrainosus: Secondary | ICD-10-CM

## 2011-12-31 DIAGNOSIS — R609 Edema, unspecified: Secondary | ICD-10-CM

## 2011-12-31 DIAGNOSIS — R21 Rash and other nonspecific skin eruption: Secondary | ICD-10-CM | POA: Insufficient documentation

## 2011-12-31 LAB — BASIC METABOLIC PANEL
BUN: 17 mg/dL (ref 6–23)
CO2: 28 mEq/L (ref 19–32)
Chloride: 106 mEq/L (ref 96–112)
Creatinine, Ser: 0.8 mg/dL (ref 0.4–1.2)

## 2011-12-31 MED ORDER — TRIAMCINOLONE ACETONIDE 0.1 % EX CREA: TOPICAL_CREAM | Freq: Two times a day (BID) | CUTANEOUS | Status: DC

## 2011-12-31 NOTE — Assessment & Plan Note (Signed)
Unclear source, use TAC topically if returned and fu prn.  She agrees.

## 2011-12-31 NOTE — Progress Notes (Signed)
2 episodes of itch whelps on B abdomen.  No clear trigger known.  Much improved now.    2 weeks ago with 7 straight days of migraines. Had been overall improved until then.  She had an aura with the episode.  No clear trigger known other than pressure change/weather change recently.    Recently with some foot swelling.  Elevated her feet last night.  Less swelling this AM.  Felt lightheaded/presyncopal yesterday during a gown fitting for a wedding.  No clear trigger. No syncope.  She started taking more fish oil and biotin.  More salt in diet.  No CP, SOB. No vertigo.    Meds, vitals, and allergies reviewed.   ROS: See HPI.  Otherwise, noncontributory.  nad ncat Tm wnl Nasal and OP exam wnl Neck supple rrr ctab CN 2-12 wnl B, S/S/DTR wnl x4 No acute rash Minimal edema in BLE

## 2011-12-31 NOTE — Assessment & Plan Note (Signed)
With no clear trigger other than weather changes.  Would continue baseline meds and she'll notify clinic if worsening. She agrees.

## 2011-12-31 NOTE — Patient Instructions (Signed)
Go to the lab on the way out.  We'll contact you with your lab report. Try to get some rest and avoid salt.  This swelling should get better gradually.   Use triamcinolone if needed for whelps.

## 2011-12-31 NOTE — Assessment & Plan Note (Signed)
Likely due to salt load.  Would avoid salt, labs and exam unremarkable.  F/u prn.

## 2012-01-01 ENCOUNTER — Encounter: Payer: Self-pay | Admitting: *Deleted

## 2012-01-07 ENCOUNTER — Other Ambulatory Visit: Payer: Self-pay

## 2012-01-07 MED ORDER — HYDROCOD POLST-CHLORPHEN POLST 10-8 MG/5ML PO LQCR: 5.0000 mL | Freq: Every evening | ORAL | Status: DC | PRN

## 2012-01-07 NOTE — Telephone Encounter (Signed)
pt left v/m requesting refill tussionex called to [facility]; pt out of med.Pt request call back when med refilled.Please advise.

## 2012-01-07 NOTE — Telephone Encounter (Signed)
Please call in

## 2012-01-08 NOTE — Telephone Encounter (Signed)
Medication phoned to pharmacy.  

## 2012-01-14 ENCOUNTER — Other Ambulatory Visit: Payer: Self-pay | Admitting: Family Medicine

## 2012-01-28 ENCOUNTER — Other Ambulatory Visit: Payer: Self-pay

## 2012-01-28 NOTE — Telephone Encounter (Signed)
Pt requested refill Tussionex. Spoke with pharmacist at [facility] did not list refills from 01/07/12. Pharmacist updated with 2 refills. Pt notified while on phone.

## 2012-03-11 ENCOUNTER — Other Ambulatory Visit: Payer: Self-pay | Admitting: Family Medicine

## 2012-03-11 MED ORDER — HYDROCOD POLST-CHLORPHEN POLST 10-8 MG/5ML PO LQCR: 5.0000 mL | Freq: Every evening | ORAL | Status: DC | PRN

## 2012-03-11 NOTE — Telephone Encounter (Signed)
Pt requesting refill for Tussionex.  States that she is completely out.

## 2012-03-11 NOTE — Telephone Encounter (Signed)
Rx phoned to pharmacy.  

## 2012-03-11 NOTE — Telephone Encounter (Signed)
Please call in

## 2012-03-18 ENCOUNTER — Other Ambulatory Visit: Payer: Self-pay | Admitting: Family Medicine

## 2012-04-16 ENCOUNTER — Other Ambulatory Visit: Payer: Self-pay | Admitting: Family Medicine

## 2012-04-16 NOTE — Telephone Encounter (Signed)
Sent!

## 2012-05-12 ENCOUNTER — Other Ambulatory Visit: Payer: Self-pay

## 2012-05-12 MED ORDER — HYDROCOD POLST-CHLORPHEN POLST 10-8 MG/5ML PO LQCR: 5.0000 mL | Freq: Every evening | ORAL | Status: DC | PRN

## 2012-05-12 NOTE — Telephone Encounter (Signed)
Please call in.   Cough prev controlled with prn tussionex. S/p allergy shots at allergy clinic. H/o nissen fundoplication. Prev with work up including EGD.  Reasonable to continue.

## 2012-05-12 NOTE — Telephone Encounter (Signed)
Pt request refill Tussionex to [facility]. Pt said still has problem with cough at night.Please advise.

## 2012-05-13 NOTE — Telephone Encounter (Signed)
Rx phoned to pharmacy.  

## 2012-05-22 ENCOUNTER — Other Ambulatory Visit: Payer: Self-pay

## 2012-05-22 DIAGNOSIS — Z1231 Encounter for screening mammogram for malignant neoplasm of breast: Secondary | ICD-10-CM

## 2012-05-22 DIAGNOSIS — Z803 Family history of malignant neoplasm of breast: Secondary | ICD-10-CM

## 2012-07-01 ENCOUNTER — Encounter: Payer: Self-pay | Admitting: *Deleted

## 2012-07-01 ENCOUNTER — Ambulatory Visit
Admission: RE | Admit: 2012-07-01 | Discharge: 2012-07-01 | Disposition: A | Discharge status: Home / Self Care | Payer: BC Managed Care – PPO | Source: Ambulatory Visit

## 2012-07-01 DIAGNOSIS — Z803 Family history of malignant neoplasm of breast: Secondary | ICD-10-CM

## 2012-07-01 DIAGNOSIS — Z1231 Encounter for screening mammogram for malignant neoplasm of breast: Secondary | ICD-10-CM

## 2012-07-15 ENCOUNTER — Other Ambulatory Visit: Payer: Self-pay

## 2012-07-15 MED ORDER — HYDROCOD POLST-CHLORPHEN POLST 10-8 MG/5ML PO LQCR: 5.0000 mL | Freq: Every evening | ORAL | Status: DC | PRN

## 2012-07-15 NOTE — Telephone Encounter (Signed)
Rx called in as directed.   

## 2012-07-15 NOTE — Telephone Encounter (Signed)
Please schedule her with me to discuss her cough, options.  Thanks.

## 2012-07-15 NOTE — Telephone Encounter (Signed)
Pt left v/m requesting refill tussionex for cough; pt is out of med and request refill called in today; spoke with [facility]ist at [facility] and pt has no refills available.Last filled 06/20/12.Marland KitchenPlease advise.

## 2012-07-15 NOTE — Telephone Encounter (Signed)
plz phone in. She's gone through 3 prescriptions in last 2 months. Will route to PCP as fyi.

## 2012-07-16 NOTE — Telephone Encounter (Signed)
Patient notified by telephone and appointment was scheduled.

## 2012-07-31 ENCOUNTER — Encounter: Payer: Self-pay | Admitting: *Deleted

## 2012-07-31 ENCOUNTER — Ambulatory Visit (INDEPENDENT_AMBULATORY_CARE_PROVIDER_SITE_OTHER): Payer: Medicare Other | Admitting: Family Medicine

## 2012-07-31 ENCOUNTER — Encounter: Payer: Self-pay | Admitting: Family Medicine

## 2012-07-31 VITALS — BP 108/70 | HR 94 | Temp 98.4°F | Wt 173.5 lb

## 2012-07-31 DIAGNOSIS — R05 Cough: Secondary | ICD-10-CM

## 2012-07-31 MED ORDER — HYDROCOD POLST-CHLORPHEN POLST 10-8 MG/5ML PO LQCR: 5.0000 mL | Freq: Every evening | ORAL | Status: DC | PRN

## 2012-07-31 NOTE — Assessment & Plan Note (Signed)
Controlled with prn tussionex. S/p allergy shots at allergy clinic. H/o nissen fundoplication. Prev with work up including EGD.  Has been going for years, "as long as I can remember".  Mother has similar.  She quit smoking and doesn't drink etoh.  She has had ENT eval prev.  No ADE from the medicine other than some constipation and she controls that with probiotics/yougurt.  No falls, no sedation.  No sputum with cough. She was treated for asthma prev w/o effect on the cough; she has had pulmonary f/u prev. Would have ENT recheck her given the chronic nature, continue the cough medicine for now.  rx printed for 30 day supply, note "fill on/after" date.  UDS today.  She agrees with plan.

## 2012-07-31 NOTE — Progress Notes (Signed)
F/u for cough.  Controlled with prn tussionex. S/p allergy shots at allergy clinic. H/o nissen fundoplication. Prev with work up including EGD.  Has been going for years, "as long as I can remember".  Mother has similar.  She quit smoking and doesn't drink etoh.  She has had ENT eval prev.  No ADE from the medicine other than some constipation and she controls that with probiotics/yougurt.  No falls, no sedation.  No sputum with cough. She was treated for asthma prev w/o effect on the cough; she has had pulmonary f/u prev.   Her migraines are overall better on the metoprolol.    Meds, vitals, and allergies reviewed.   ROS: See HPI.  Otherwise, noncontributory.  nad ncat Mmm, OP wnl, nasal and TM exam wnl rrr ctab abd soft Ext w/o edema

## 2012-07-31 NOTE — Patient Instructions (Addendum)
Go to the lab on the way out. See Shirlee Limerick about your referral before you leave today. Take care.

## 2012-08-11 ENCOUNTER — Other Ambulatory Visit: Payer: Self-pay | Admitting: Family Medicine

## 2012-08-11 ENCOUNTER — Encounter: Payer: Self-pay | Admitting: Radiology

## 2012-08-11 ENCOUNTER — Encounter: Payer: Self-pay | Admitting: *Deleted

## 2012-08-12 NOTE — Telephone Encounter (Signed)
Electronic refill request.  Please advise. 

## 2012-08-13 NOTE — Telephone Encounter (Signed)
Sent!

## 2012-08-19 ENCOUNTER — Encounter: Payer: Self-pay | Admitting: Family Medicine

## 2012-09-05 ENCOUNTER — Other Ambulatory Visit: Payer: Self-pay | Admitting: Family Medicine

## 2012-09-05 MED ORDER — HYDROCOD POLST-CHLORPHEN POLST 10-8 MG/5ML PO LQCR: 5.0000 mL | Freq: Every evening | ORAL | Status: DC | PRN

## 2012-09-05 NOTE — Telephone Encounter (Signed)
Medication phoned to pharmacy.  

## 2012-09-05 NOTE — Telephone Encounter (Signed)
Pt requesting Tussionex refill.  States that she has "one more night" worth of medication.  Please advise.

## 2012-09-05 NOTE — Telephone Encounter (Signed)
please call in.  Thanks.   

## 2012-10-07 ENCOUNTER — Other Ambulatory Visit: Payer: Self-pay

## 2012-10-07 MED ORDER — HYDROCOD POLST-CHLORPHEN POLST 10-8 MG/5ML PO LQCR: 5.0000 mL | Freq: Every evening | ORAL | Status: DC | PRN

## 2012-10-07 NOTE — Telephone Encounter (Signed)
Pt request refill [facility] to [facility]. Pt request to be refilled today. Pt got notification from insurance co time for colonoscopy. Please advise.

## 2012-10-07 NOTE — Telephone Encounter (Signed)
Medication phoned to pharmacy. Patient advised.  

## 2012-10-07 NOTE — Telephone Encounter (Signed)
She had colonoscopy 12/2007, rec'd 10 year f/u.  Likely not due now.  Please call in rx.  Thanks.

## 2012-10-13 ENCOUNTER — Other Ambulatory Visit: Payer: Self-pay | Admitting: Family Medicine

## 2012-11-02 ENCOUNTER — Other Ambulatory Visit: Payer: Self-pay | Admitting: Family Medicine

## 2012-11-05 ENCOUNTER — Other Ambulatory Visit: Payer: Self-pay

## 2012-11-05 MED ORDER — HYDROCOD POLST-CHLORPHEN POLST 10-8 MG/5ML PO LQCR: 5.0000 mL | Freq: Every evening | ORAL | Status: DC | PRN

## 2012-11-05 NOTE — Telephone Encounter (Signed)
Pt left v/m requesting refill tussionex for cough at night to [facility]. Pt request cb when refilled.

## 2012-11-05 NOTE — Telephone Encounter (Signed)
Rx called to pharmacy. Patient notified by telephone. 

## 2012-11-05 NOTE — Telephone Encounter (Signed)
Please call in.  Thanks.   

## 2012-12-01 ENCOUNTER — Other Ambulatory Visit: Payer: Self-pay | Admitting: Family Medicine

## 2012-12-03 ENCOUNTER — Other Ambulatory Visit: Payer: Self-pay | Admitting: Family Medicine

## 2012-12-04 ENCOUNTER — Other Ambulatory Visit: Payer: Self-pay

## 2012-12-04 MED ORDER — HYDROCOD POLST-CHLORPHEN POLST 10-8 MG/5ML PO LQCR: 5.0000 mL | Freq: Every evening | ORAL | Status: DC | PRN

## 2012-12-04 NOTE — Telephone Encounter (Signed)
Pt left v/m; pt is out of Tussionex and request med called to [facility]; pt request cb when med refilled.

## 2012-12-04 NOTE — Telephone Encounter (Signed)
Printed. Will need to pick up.

## 2012-12-05 NOTE — Telephone Encounter (Signed)
Patient advised.  Rx left at front desk for pick up. 

## 2012-12-13 ENCOUNTER — Other Ambulatory Visit: Payer: Self-pay | Admitting: Family Medicine

## 2012-12-25 ENCOUNTER — Other Ambulatory Visit: Payer: Self-pay

## 2012-12-29 ENCOUNTER — Other Ambulatory Visit: Payer: Self-pay | Admitting: Family Medicine

## 2012-12-30 ENCOUNTER — Other Ambulatory Visit: Payer: Self-pay | Admitting: *Deleted

## 2012-12-30 NOTE — Telephone Encounter (Signed)
Electronic refill request.  Please advise. 

## 2012-12-30 NOTE — Telephone Encounter (Signed)
Faxed refill request. Please advise.  

## 2012-12-31 MED ORDER — CYCLOBENZAPRINE HCL 10 MG PO TABS: ORAL_TABLET | ORAL | Status: DC

## 2012-12-31 MED ORDER — PROMETHAZINE HCL 25 MG PO TABS: ORAL_TABLET | ORAL | Status: DC

## 2012-12-31 NOTE — Telephone Encounter (Signed)
Sent on anther refill request.

## 2012-12-31 NOTE — Telephone Encounter (Signed)
Sent!

## 2013-01-05 ENCOUNTER — Other Ambulatory Visit: Payer: Self-pay

## 2013-01-05 MED ORDER — HYDROCOD POLST-CHLORPHEN POLST 10-8 MG/5ML PO LQCR: 5.0000 mL | Freq: Every evening | ORAL | Status: DC | PRN

## 2013-01-05 NOTE — Telephone Encounter (Signed)
Pt left v/m for rx tussionex. Call when ready for pick up. Pt said she has had knee surgery and gave her husband a note that he can pick up her medication. Pt said he has appt today at 9: 45 am at [facility] and I advised pt rx might not be ready at that time since it is 9:30 am now. Pt voiced understanding.

## 2013-01-05 NOTE — Telephone Encounter (Signed)
Printed, please give when signed. Thanks.

## 2013-01-05 NOTE — Telephone Encounter (Signed)
When Rx ready please call Noral (pt husbacd) cell phone at [phone] when ready for pick up.

## 2013-01-06 NOTE — Telephone Encounter (Signed)
Noral advised at cell phone number.  Rx left at front desk for pick up.

## 2013-02-09 ENCOUNTER — Ambulatory Visit (INDEPENDENT_AMBULATORY_CARE_PROVIDER_SITE_OTHER): Payer: Medicare Other | Admitting: Family Medicine

## 2013-02-09 ENCOUNTER — Encounter: Payer: Self-pay | Admitting: Family Medicine

## 2013-02-09 VITALS — BP 140/80 | HR 92 | Temp 98.0°F | Wt 168.5 lb

## 2013-02-09 DIAGNOSIS — R05 Cough: Secondary | ICD-10-CM

## 2013-02-09 MED ORDER — HYDROCOD POLST-CHLORPHEN POLST 10-8 MG/5ML PO LQCR: 5.0000 mL | Freq: Every evening | ORAL | Status: DC | PRN

## 2013-02-09 NOTE — Patient Instructions (Signed)
Finish the prednisone as ortho directed.  This should gradually improve.  Take care.  Glad to see you.

## 2013-02-09 NOTE — Addendum Note (Signed)
Addended by: Joaquim Nam on: 02/09/2013 03:12 PM   Modules accepted: Orders

## 2013-02-09 NOTE — Assessment & Plan Note (Signed)
Likely improved on prednisone.  Likely viral.  ctab and should improve.  Continue as is with current meds.  D/w pt.  Nontoxic.

## 2013-02-09 NOTE — Progress Notes (Signed)
Pre-visit discussion using our clinic review tool. No additional management support is needed unless otherwise documented below in the visit note.  R knee pain continues, per ortho.  On pred taper per ortho.    Cough.  She started having a worse cough a few weeks ago.  On hycodan at baseline, but that isn't helping the new cough.  Sick contact at baseline.  On pred taper per ortho, started on 3 days ago.  Discolored "hunk" of sputum coughed up once but not since.  No fevers.  She feels some better now.  No wheeze.  Cough continues at night.  No chills, no fevers.  No facial pain.    Meds, vitals, and allergies reviewed.   ROS: See HPI.  Otherwise, noncontributory.  GEN: nad, alert and oriented HEENT: mucous membranes moist, tm w/o erythema, nasal exam w/o erythema, clear discharge noted,  OP with cobblestoning NECK: supple w/o LA CV: rrr.   PULM: ctab, no inc wob, no wheeze, occ dry cough noted.  EXT: no edema SKIN: no acute rash

## 2013-02-18 ENCOUNTER — Other Ambulatory Visit: Payer: Self-pay | Admitting: Family Medicine

## 2013-02-23 ENCOUNTER — Other Ambulatory Visit: Payer: Self-pay | Admitting: Family Medicine

## 2013-02-23 NOTE — Telephone Encounter (Signed)
Electronic refill request.  Please advise. 

## 2013-02-23 NOTE — Telephone Encounter (Signed)
Sent, thanks

## 2013-03-11 ENCOUNTER — Other Ambulatory Visit: Payer: Self-pay

## 2013-03-11 MED ORDER — HYDROCOD POLST-CHLORPHEN POLST 10-8 MG/5ML PO LQCR
5.0000 mL | Freq: Every evening | ORAL | Status: DC | PRN
Start: 2013-03-11 — End: 2013-04-15

## 2013-03-11 NOTE — Telephone Encounter (Signed)
Printed

## 2013-03-11 NOTE — Telephone Encounter (Signed)
Pt left v/m requesting rx tussionex. Call when ready for pick up. 

## 2013-03-11 NOTE — Telephone Encounter (Signed)
Patient notified that script is up front and ready for pickup. 

## 2013-04-15 ENCOUNTER — Telehealth: Payer: Self-pay | Admitting: Family Medicine

## 2013-04-15 MED ORDER — HYDROCOD POLST-CHLORPHEN POLST 10-8 MG/5ML PO LQCR: 5.0000 mL | Freq: Every evening | ORAL | Status: DC | PRN

## 2013-04-15 NOTE — Telephone Encounter (Signed)
Printed.  Thanks.  

## 2013-04-15 NOTE — Telephone Encounter (Signed)
Patient notified that script is up front ready for pickup.

## 2013-04-15 NOTE — Telephone Encounter (Signed)
Pt is needing refill on Tusinex. [facility] [address], Please advise

## 2013-04-17 ENCOUNTER — Other Ambulatory Visit: Payer: Self-pay | Admitting: Family Medicine

## 2013-04-17 NOTE — Telephone Encounter (Signed)
Electronic refill request.  Please advise. 

## 2013-04-17 NOTE — Telephone Encounter (Signed)
Sent!

## 2013-05-13 ENCOUNTER — Other Ambulatory Visit: Payer: Self-pay

## 2013-05-13 MED ORDER — HYDROCOD POLST-CHLORPHEN POLST 10-8 MG/5ML PO LQCR: 5.0000 mL | Freq: Every evening | ORAL | Status: DC | PRN

## 2013-05-13 NOTE — Telephone Encounter (Signed)
Patient notified that script is up front and ready for pickup. 

## 2013-05-13 NOTE — Telephone Encounter (Signed)
Printed.  Thanks.  

## 2013-05-13 NOTE — Telephone Encounter (Signed)
Pt left v/m requesting rx tussionex. Call when ready for pick up. Pt will be out of med on 05/15/13.

## 2013-05-13 NOTE — Telephone Encounter (Signed)
Left message on answering machine to call back.

## 2013-05-19 ENCOUNTER — Encounter: Payer: Self-pay | Admitting: Family Medicine

## 2013-05-19 ENCOUNTER — Ambulatory Visit (INDEPENDENT_AMBULATORY_CARE_PROVIDER_SITE_OTHER): Payer: Medicare Other | Admitting: Family Medicine

## 2013-05-19 VITALS — BP 130/82 | HR 98 | Temp 98.0°F | Wt 171.2 lb

## 2013-05-19 DIAGNOSIS — Z01818 Encounter for other preprocedural examination: Secondary | ICD-10-CM

## 2013-05-19 DIAGNOSIS — M179 Osteoarthritis of knee, unspecified: Secondary | ICD-10-CM | POA: Insufficient documentation

## 2013-05-19 DIAGNOSIS — M1710 Unilateral primary osteoarthritis, unspecified knee: Secondary | ICD-10-CM | POA: Insufficient documentation

## 2013-05-19 DIAGNOSIS — IMO0002 Reserved for concepts with insufficient information to code with codable children: Secondary | ICD-10-CM

## 2013-05-19 NOTE — Progress Notes (Signed)
Pre visit review using our clinic review tool, if applicable. No additional management support is needed unless otherwise documented below in the visit note.  She had tolerated anesthesia prev.  No h/o MI, CHF, CVA, DM, TIA, CABG, stent.  No CP, SOB.  Only has some BLE edema from joint pain/arthritis.  BP controlled.  Planned R knee replacement.  She has bone on bone knee degeneration and pain limits her daily activity.   Constipation with dilaudid, taken for pain in knee.    Her brother was found dead on Christmas day, he had COPD.  She is trying to work through this.   I offered my condolences.   Meds, vitals, and allergies reviewed.   ROS: See HPI.  Otherwise, noncontributory.  GEN: nad, alert and oriented HEENT: mucous membranes moist NECK: supple w/o LA CV: rrr. PULM: ctab, no inc wob ABD: soft, +bs EXT: no edema SKIN: no acute rash  EKG w/o acute changes.

## 2013-05-19 NOTE — Patient Instructions (Addendum)
As needed for constipation- colace 100mg  a day (stool softener).  If needed, take 1 dose of miralax a day.   You can use prep H or witch hazel for the hemorrhoids.  Try to avoid straining with BMs.  Your EKG looks okay.  I'll send a note to ortho.  Take care .

## 2013-05-19 NOTE — Assessment & Plan Note (Addendum)
She had tolerated anesthesia prev.  No h/o MI, CHF, CVA, DM, TIA, CABG, stent.  No CP, SOB.  Only has some BLE edema from joint pain/arthritis.  BP controlled.  Planned R knee replacement.  She has bone on bone knee degeneration and pain limits her daily activity.   Appropriately low risk for surgery.  Will notify ortho. Will defer all preop labs to ortho/anesthesia.  App help of all involved.   >25 minutes spent in face to face time with patient, >50% spent in counselling or coordination of care.

## 2013-05-27 ENCOUNTER — Encounter (HOSPITAL_COMMUNITY): Payer: Self-pay | Admitting: Pharmacy Technician

## 2013-06-03 ENCOUNTER — Inpatient Hospital Stay (HOSPITAL_COMMUNITY): Admission: RE | Admit: 2013-06-03 | Payer: Medicare Other | Source: Ambulatory Visit

## 2013-06-05 ENCOUNTER — Telehealth: Payer: Self-pay | Admitting: Family Medicine

## 2013-06-05 NOTE — Telephone Encounter (Signed)
Noted, thanks!

## 2013-06-05 NOTE — Telephone Encounter (Signed)
Pt called to inform you that Dr.Gioffreis having back surgery and will not be able to perform her knee surgery. Dr. Ihor Gully is now going to do her surgery on May 5th.

## 2013-06-12 NOTE — H&P (Signed)
UNICOMPARTMENTAL KNEE ADMISSION H&P  Patient is being admitted for right medial unicompartmental knee arthroplasty.  Subjective:  Chief Complaint:     Right knee medial compartment OA / pain.  HPI: Alice Smith, 68 y.o. female, has a history of pain and functional disability in the right knee due to arthritis and has failed non-surgical conservative treatments for greater than 12 weeks to include NSAID's and/or analgesics, use of assistive devices and activity modification.  Onset of symptoms was abrupt, starting 8 months ago with gradually worsening course since that time. The patient noted prior procedures on the knee to include  arthroscopy on the right knee(s).  Patient currently rates pain in the right knee(s) at 10 out of 10 with activity. Patient has night pain, worsening of pain with activity and weight bearing, pain that interferes with activities of daily living, pain with passive range of motion, crepitus and joint swelling.  Patient has evidence of periarticular osteophytes and joint space narrowing of the medial compartment by imaging studies. There is no active infection.  Risks, benefits and expectations were discussed with the patient.  Risks including but not limited to the risk of anesthesia, blood clots, nerve damage, blood vessel damage, failure of the prosthesis, infection and up to and including death.  Patient understand the risks, benefits and expectations and wishes to proceed with surgery.   D/C Plans:   Home with HHPT  Post-op Meds:    No Rx given  Tranexamic Acid:   To be given  Decadron:    To be given  FYI:    ASA post-op  Dilaudid post-op   Patient Active Problem List   Diagnosis Date Noted  . Knee osteoarthritis 05/19/2013  . Edema 12/31/2011  . Rash 12/31/2011  . Medicare annual wellness visit, initial 10/14/2011  . PALPITATIONS 03/11/2009  . CHEST DISCOMFORT 03/11/2009  . Cough 02/21/2009  . NAUSEA 02/21/2009  . HERPETIC GINGIVOSTOMATITIS  06/30/2008  . HEMORRHOIDS 01/07/2008  . DIVERTICULOSIS, MILD 01/07/2008  . HYPERLIPIDEMIA 01/02/2008  . GERD 01/02/2008  . FECAL OCCULT BLOOD 12/22/2007  . DERMATOPHYTOSIS OF NAIL 12/18/2007  . HEMOCCULT POSITIVE STOOL 12/18/2007  . OSTEOPENIA 01/28/2007  . COMMON MIGRAINE 12/24/2006  . ANXIETY STATE NOS 11/08/2006  . INSOMNIA, CHRONIC 11/08/2006  . ALLERGIC  RHINITIS 07/17/2006  . KNEE PAIN, RIGHT 07/17/2006   Past Medical History  Diagnosis Date  . Asthma   . GERD (gastroesophageal reflux disease)     s/p fundoplication, EGD normal 01/10/04  . Hyperlipidemia   . Osteoporosis     last dexa 2013- osteopenia  . Migraine     evaluated with Dr. Domingo Cocking 11/2009  . Breast mass 05/2001    benign    Past Surgical History  Procedure Laterality Date  . Partial hysterectomy  1989    dyspasia, ovaries left in  . Rotator cuff repair      right 03/2001, left 07/2001  . Carpal tunnel release      right 03/2001, left 07/2001  . Esophagogastroduodenoscopy      No prescriptions prior to admission   Allergies  Allergen Reactions  . Celecoxib     REACTION: rash  . Clarithromycin     REACTION: nausea and vomiting  . Erythromycin Ethylsuccinate     REACTION: GI upset  . Fluticasone-Salmeterol     REACTION: Sore tongue and rash  . Procaine Hcl     REACTION: swelling  . Sulfonamide Derivatives     REACTION: rash  . Topiramate  REACTION: confusion  . Zolmitriptan     REACTION: nausea  . Codeine Rash    History  Substance Use Topics  . Smoking status: Former Research scientist (life sciences)  . Smokeless tobacco: Not on file     Comment: 10 pack year history  . Alcohol Use: No    Family History  Problem Relation Age of Onset  . Arthritis Mother   . Cancer Father     throat  . Cancer Sister     breast  . Breast cancer Sister   . Colon cancer Neg Hx   . COPD Brother      Review of Systems  Constitutional: Negative.   HENT: Negative.   Eyes: Negative.   Respiratory: Positive for cough.    Cardiovascular: Negative.   Gastrointestinal: Positive for heartburn and constipation.  Genitourinary: Negative.   Skin: Negative.   Neurological: Negative.   Endo/Heme/Allergies: Positive for environmental allergies.  Psychiatric/Behavioral: The patient has insomnia.     Objective:  Physical Exam  Constitutional: She is oriented to person, place, and time. She appears well-developed and well-nourished.  HENT:  Head: Normocephalic and atraumatic.  Mouth/Throat: Oropharynx is clear and moist.  Eyes: Pupils are equal, round, and reactive to light.  Neck: Neck supple. No JVD present. No tracheal deviation present. No thyromegaly present.  Cardiovascular: Normal rate, regular rhythm, normal heart sounds and intact distal pulses.   Respiratory: Effort normal and breath sounds normal. No stridor.  GI: Soft. There is no tenderness. There is no guarding.  Musculoskeletal:       Right knee: She exhibits decreased range of motion, swelling and bony tenderness. She exhibits no ecchymosis, no deformity, no laceration and no erythema. Tenderness found. Medial joint line tenderness noted. No lateral joint line tenderness noted.  Lymphadenopathy:    She has no cervical adenopathy.  Neurological: She is alert and oriented to person, place, and time.  Skin: Skin is warm and dry.  Psychiatric: She has a normal mood and affect.     Labs:  Estimated body mass index is 30.80 kg/(m^2) as calculated from the following:   Height as of 10/12/11: 5' 2.5" (1.588 m).   Weight as of 05/19/13: 77.678 kg (171 lb 4 oz).   Imaging Review Plain radiographs demonstrate moderate degenerative joint disease of the right knee medial compartment. The overall alignment is neutral. The bone quality appears to be good for age and reported activity level.  Assessment/Plan:  End stage arthritis, right knee   The patient history, physical examination, clinical judgment of the provider and imaging studies are  consistent with end stage degenerative joint disease of the right knee(s) and medial unicompartmental knee arthroplasty is deemed medically necessary. The treatment options including medical management, injection therapy arthroscopy and arthroplasty were discussed at length. The risks and benefits of total knee arthroplasty were presented and reviewed. The risks due to aseptic loosening, infection, stiffness, patella tracking problems, thromboembolic complications and other imponderables were discussed. The patient acknowledged the explanation, agreed to proceed with the plan and consent was signed. Patient is being admitted for inpatient treatment for surgery, pain control, PT, OT, prophylactic antibiotics, VTE prophylaxis, progressive ambulation and ADL's and discharge planning. The patient is planning to be discharged home with home health services.     West Pugh Raley Novicki   PAC  06/12/2013, 3:16 PM

## 2013-06-12 NOTE — Patient Instructions (Addendum)
Alice Smith  06/12/2013                           YOUR PROCEDURE IS SCHEDULED ON: 06/23/13               PLEASE REPORT TO SHORT STAY CENTER AT : 8:30 am               CALL THIS NUMBER IF ANY PROBLEMS THE DAY OF SURGERY :               [phone]                                REMEMBER:   Do not eat food or drink liquids AFTER MIDNIGHT                Take these medicines the morning of surgery with               A SIPS OF WATER :  DILAUDID / EVISTA      Do not wear jewelry, make-up   Do not wear lotions, powders, or perfumes.   Do not shave legs or underarms 12 hrs. before surgery (men may shave face)  Do not bring valuables to the hospital.  Contacts, dentures or bridgework may not be worn into surgery.  Leave suitcase in the car. After surgery it may be brought to your room.  For patients admitted to the hospital more than one night, checkout time is            11:00 AM                                                       The day of discharge.   Patients discharged the day of surgery will not be allowed to drive home.            If going home same day of surgery, must have someone stay with you              FIRST 24 hrs at home and arrange for some one to drive you              home from hospital. __________________________________________________________________________________________________________________________                                                                              [facility] - Preparing for Surgery Before surgery, you can play an important role.  Because skin is not sterile, your skin needs to be as free of germs as possible.  You can reduce the number of germs on your skin by washing with CHG (chlorahexidine gluconate) soap before surgery.  CHG is an antiseptic cleaner which kills germs and bonds with the skin to continue killing germs even after washing. Please DO NOT use if you have an allergy to CHG or antibacterial soaps.  If  your skin becomes reddened/irritated stop using the CHG  and inform your nurse when you arrive at Short Stay. Do not shave (including legs and underarms) for at least 48 hours prior to the first CHG shower.  You may shave your face. Please follow these instructions carefully:  1.  Shower with CHG Soap the night before surgery and the  morning of Surgery.   2.  If you choose to wash your hair, wash your hair first as usual with your  normal  Shampoo.   3.  After you shampoo, rinse your hair and body thoroughly to remove the  shampoo.                                         4.  Use CHG as you would any other liquid soap.  You can apply chg directly  to the skin and wash . Gently wash with scrungie or clean wascloth    5.  Apply the CHG Soap to your body ONLY FROM THE NECK DOWN.   Do not use on open                           Wound or open sores. Avoid contact with eyes, ears mouth and genitals (private parts).                        Genitals (private parts) with your normal soap.              6.  Wash thoroughly, paying special attention to the area where your surgery  will be performed.   7.  Thoroughly rinse your body with warm water from the neck down.   8.  DO NOT shower/wash with your normal soap after using and rinsing off  the CHG Soap .                9.  Pat yourself dry with a clean towel.             10.  Wear clean pajamas.             11.  Place clean sheets on your bed the night of your first shower and do not  sleep with pets.  Day of Surgery : Do not apply any lotions/deodorants the morning of surgery.  Please wear clean clothes to the hospital/surgery center.  FAILURE TO FOLLOW THESE INSTRUCTIONS MAY RESULT IN THE CANCELLATION OF YOUR SURGERY    PATIENT SIGNATURE_________________________________     Incentive Spirometer  An incentive spirometer is a tool that can help keep your lungs clear and active. This tool measures how well you are filling your lungs with  each breath. Taking long deep breaths may help reverse or decrease the chance of developing breathing (pulmonary) problems (especially infection) following:  A long period of time when you are unable to move or be active. BEFORE THE PROCEDURE   If the spirometer includes an indicator to show your best effort, your nurse or respiratory therapist will set it to a desired goal.  If possible, sit up straight or lean slightly forward. Try not to slouch.  Hold the incentive spirometer in an upright position. INSTRUCTIONS FOR USE  1. Sit on the edge of your bed if possible, or sit up as far as you can in bed or on a chair. 2. Hold the incentive spirometer in an  upright position. 3. Breathe out normally. 4. Place the mouthpiece in your mouth and seal your lips tightly around it. 5. Breathe in slowly and as deeply as possible, raising the piston or the ball toward the top of the column. 6. Hold your breath for 3-5 seconds or for as long as possible. Allow the piston or ball to fall to the bottom of the column. 7. Remove the mouthpiece from your mouth and breathe out normally. 8. Rest for a few seconds and repeat Steps 1 through 7 at least 10 times every 1-2 hours when you are awake. Take your time and take a few normal breaths between deep breaths. 9. The spirometer may include an indicator to show your best effort. Use the indicator as a goal to work toward during each repetition. 10. After each set of 10 deep breaths, practice coughing to be sure your lungs are clear. If you have an incision (the cut made at the time of surgery), support your incision when coughing by placing a pillow or rolled up towels firmly against it. Once you are able to get out of bed, walk around indoors and cough well. You may stop using the incentive spirometer when instructed by your caregiver.  RISKS AND COMPLICATIONS  Take your time so you do not get dizzy or light-headed.  If you are in pain, you may need to take or  ask for pain medication before doing incentive spirometry. It is harder to take a deep breath if you are having pain. AFTER USE  Rest and breathe slowly and easily.  It can be helpful to keep track of a log of your progress. Your caregiver can provide you with a simple table to help with this. If you are using the spirometer at home, follow these instructions: [place] IF:   You are having difficultly using the spirometer.  You have trouble using the spirometer as often as instructed.  Your pain medication is not giving enough relief while using the spirometer.  You develop fever of 100.5 F (38.1 C) or higher. SEEK IMMEDIATE MEDICAL CARE IF:   You cough up bloody sputum that had not been present before.  You develop fever of 102 F (38.9 C) or greater.  You develop worsening pain at or near the incision site. MAKE SURE YOU:   Understand these instructions.  Will watch your condition.  Will get help right away if you are not doing well or get worse. Document Released: 06/18/2006 Document Revised: 04/30/2011 Document Reviewed: 08/19/2006 Exit[employer] Patient Information 2014 [employer], [place].   ________________________________________________________________________  WHAT IS A BLOOD TRANSFUSION? Blood Transfusion Information  A transfusion is the replacement of blood or some of its parts. Blood is made up of multiple cells which provide different functions.  Red blood cells carry oxygen and are used for blood loss replacement.  White blood cells fight against infection.  Platelets control bleeding.  Plasma helps clot blood.  Other blood products are available for specialized needs, such as hemophilia or other clotting disorders. BEFORE THE TRANSFUSION  Who gives blood for transfusions?   Healthy volunteers who are fully evaluated to make sure their blood is safe. This is blood bank blood. Transfusion therapy is the safest it has ever been in the practice of  medicine. Before blood is taken from a donor, a complete history is taken to make sure that person has no history of diseases nor engages in risky social behavior (examples are intravenous drug use or sexual activity with  multiple partners). The donor's travel history is screened to minimize risk of transmitting infections, such as malaria. The donated blood is tested for signs of infectious diseases, such as HIV and hepatitis. The blood is then tested to be sure it is compatible with you in order to minimize the chance of a transfusion reaction. If you or a relative donates blood, this is often done in anticipation of surgery and is not appropriate for emergency situations. It takes many days to process the donated blood. RISKS AND COMPLICATIONS Although transfusion therapy is very safe and saves many lives, the main dangers of transfusion include:   Getting an infectious disease.  Developing a transfusion reaction. This is an allergic reaction to something in the blood you were given. Every precaution is taken to prevent this. The decision to have a blood transfusion has been considered carefully by your caregiver before blood is given. Blood is not given unless the benefits outweigh the risks. AFTER THE TRANSFUSION  Right after receiving a blood transfusion, you will usually feel much better and more energetic. This is especially true if your red blood cells have gotten low (anemic). The transfusion raises the level of the red blood cells which carry oxygen, and this usually causes an energy increase.  The nurse administering the transfusion will monitor you carefully for complications. HOME CARE INSTRUCTIONS  No special instructions are needed after a transfusion. You may find your energy is better. Speak with your caregiver about any limitations on activity for underlying diseases you may have. SEEK MEDICAL CARE IF:   Your condition is not improving after your transfusion.  You develop  redness or irritation at the intravenous (IV) site. SEEK IMMEDIATE MEDICAL CARE IF:  Any of the following symptoms occur over the next 12 hours:  Shaking chills.  You have a temperature by mouth above 102 F (38.9 C), not controlled by medicine.  Chest, back, or muscle pain.  People around you feel you are not acting correctly or are confused.  Shortness of breath or difficulty breathing.  Dizziness and fainting.  You get a rash or develop hives.  You have a decrease in urine output.  Your urine turns a dark color or changes to pink, red, or brown. Any of the following symptoms occur over the next 10 days:  You have a temperature by mouth above 102 F (38.9 C), not controlled by medicine.  Shortness of breath.  Weakness after normal activity.  The white part of the eye turns yellow (jaundice).  You have a decrease in the amount of urine or are urinating less often.  Your urine turns a dark color or changes to pink, red, or brown. Document Released: 02/03/2000 Document Revised: 04/30/2011 Document Reviewed: 09/22/2007 [facility] Patient Information 2014 [place], [place].  _______________________________________________________________________

## 2013-06-13 ENCOUNTER — Other Ambulatory Visit: Payer: Self-pay | Admitting: Family Medicine

## 2013-06-15 ENCOUNTER — Other Ambulatory Visit: Payer: Self-pay

## 2013-06-15 MED ORDER — HYDROCOD POLST-CHLORPHEN POLST 10-8 MG/5ML PO LQCR: 5.0000 mL | Freq: Every evening | ORAL | Status: DC | PRN

## 2013-06-15 NOTE — Telephone Encounter (Signed)
Patient advised.  Rx left at front desk for pick up. 

## 2013-06-15 NOTE — Telephone Encounter (Signed)
Printed.  Thanks.  

## 2013-06-15 NOTE — Telephone Encounter (Signed)
Pt left v/m requesting rx tussionex. Call when ready for pick up. 

## 2013-06-15 NOTE — Telephone Encounter (Signed)
Electronic refill request.  No historical refill available.  Please advise.

## 2013-06-15 NOTE — Telephone Encounter (Signed)
Sent.  Had been on prev.

## 2013-06-16 ENCOUNTER — Ambulatory Visit (HOSPITAL_COMMUNITY)
Admission: RE | Admit: 2013-06-16 | Discharge: 2013-06-16 | Disposition: A | Discharge status: Home / Self Care | Payer: BC Managed Care – PPO | Source: Ambulatory Visit | Attending: Anesthesiology | Admitting: Anesthesiology

## 2013-06-16 ENCOUNTER — Encounter (HOSPITAL_COMMUNITY)
Admission: RE | Admit: 2013-06-16 | Discharge: 2013-06-16 | Disposition: A | Discharge status: Home / Self Care | Payer: BC Managed Care – PPO | Source: Ambulatory Visit | Attending: Orthopedic Surgery | Admitting: Orthopedic Surgery

## 2013-06-16 ENCOUNTER — Encounter (HOSPITAL_COMMUNITY): Payer: Self-pay

## 2013-06-16 DIAGNOSIS — Z01812 Encounter for preprocedural laboratory examination: Secondary | ICD-10-CM | POA: Insufficient documentation

## 2013-06-16 DIAGNOSIS — Z01818 Encounter for other preprocedural examination: Secondary | ICD-10-CM | POA: Insufficient documentation

## 2013-06-16 HISTORY — DX: Unspecified hemorrhoids: K64.9

## 2013-06-16 HISTORY — DX: Unspecified osteoarthritis, unspecified site: M19.90

## 2013-06-16 HISTORY — DX: Cough: R05

## 2013-06-16 HISTORY — DX: Adverse effect of unspecified anesthetic, initial encounter: T41.45XA

## 2013-06-16 HISTORY — DX: Other complications of anesthesia, initial encounter: T88.59XA

## 2013-06-16 HISTORY — DX: Other specified cough: R05.8

## 2013-06-16 LAB — CBC
HCT: 37.3 % (ref 36.0–46.0)
Hemoglobin: 12.9 g/dL (ref 12.0–15.0)
MCH: 30.5 pg (ref 26.0–34.0)
MCHC: 34.6 g/dL (ref 30.0–36.0)
MCV: 88.2 fL (ref 78.0–100.0)
PLATELETS: 260 10*3/uL (ref 150–400)
RBC: 4.23 MIL/uL (ref 3.87–5.11)
RDW: 12.6 % (ref 11.5–15.5)
WBC: 5.6 10*3/uL (ref 4.0–10.5)

## 2013-06-16 LAB — BASIC METABOLIC PANEL
BUN: 18 mg/dL (ref 6–23)
CALCIUM: 10 mg/dL (ref 8.4–10.5)
CO2: 26 mEq/L (ref 19–32)
CREATININE: 0.72 mg/dL (ref 0.50–1.10)
Chloride: 103 mEq/L (ref 96–112)
GFR calc non Af Amer: 86 mL/min — ABNORMAL LOW (ref >90)
Glucose, Bld: 88 mg/dL (ref 70–99)
Potassium: 4.7 mEq/L (ref 3.7–5.3)
Sodium: 140 mEq/L (ref 137–147)

## 2013-06-16 LAB — ABO/RH: ABO/RH(D): O POS

## 2013-06-16 LAB — URINALYSIS, ROUTINE W REFLEX MICROSCOPIC
BILIRUBIN URINE: NEGATIVE
GLUCOSE, UA: NEGATIVE mg/dL
HGB URINE DIPSTICK: NEGATIVE
Ketones, ur: NEGATIVE mg/dL
Leukocytes, UA: NEGATIVE
Nitrite: NEGATIVE
PROTEIN: NEGATIVE mg/dL
Specific Gravity, Urine: 1.011 (ref 1.005–1.030)
Urobilinogen, UA: 0.2 mg/dL (ref 0.0–1.0)
pH: 6 (ref 5.0–8.0)

## 2013-06-16 LAB — PROTIME-INR
INR: 0.93 (ref 0.00–1.49)
Prothrombin Time: 12.3 seconds (ref 11.6–15.2)

## 2013-06-16 LAB — SURGICAL PCR SCREEN
MRSA, PCR: NEGATIVE
STAPHYLOCOCCUS AUREUS: NEGATIVE

## 2013-06-16 LAB — APTT: APTT: 37 s (ref 24–37)

## 2013-06-21 ENCOUNTER — Other Ambulatory Visit: Payer: Medicare Other | Admitting: Family Medicine

## 2013-06-22 NOTE — Telephone Encounter (Signed)
Electronic refill request. Last filled:  ?  Please advise.

## 2013-06-22 NOTE — Telephone Encounter (Signed)
Sent!

## 2013-06-23 ENCOUNTER — Encounter (HOSPITAL_COMMUNITY)
Admission: RE | Disposition: A | Discharge status: Home Health | Payer: Self-pay | Source: Ambulatory Visit | Attending: Orthopedic Surgery

## 2013-06-23 ENCOUNTER — Inpatient Hospital Stay (HOSPITAL_COMMUNITY): Payer: BC Managed Care – PPO | Admitting: Anesthesiology

## 2013-06-23 ENCOUNTER — Inpatient Hospital Stay (HOSPITAL_COMMUNITY)
Admission: RE | Admit: 2013-06-23 | Discharge: 2013-06-24 | DRG: 470 | Disposition: A | Discharge status: Home Health | Payer: BC Managed Care – PPO | Source: Ambulatory Visit | Attending: Orthopedic Surgery | Admitting: Orthopedic Surgery

## 2013-06-23 ENCOUNTER — Encounter (HOSPITAL_COMMUNITY): Payer: Self-pay | Admitting: *Deleted

## 2013-06-23 ENCOUNTER — Encounter (HOSPITAL_COMMUNITY): Payer: BC Managed Care – PPO | Admitting: Anesthesiology

## 2013-06-23 DIAGNOSIS — Z87891 Personal history of nicotine dependence: Secondary | ICD-10-CM

## 2013-06-23 DIAGNOSIS — Z881 Allergy status to other antibiotic agents status: Secondary | ICD-10-CM

## 2013-06-23 DIAGNOSIS — D62 Acute posthemorrhagic anemia: Secondary | ICD-10-CM | POA: Diagnosis not present

## 2013-06-23 DIAGNOSIS — Z885 Allergy status to narcotic agent status: Secondary | ICD-10-CM

## 2013-06-23 DIAGNOSIS — K219 Gastro-esophageal reflux disease without esophagitis: Secondary | ICD-10-CM | POA: Diagnosis present

## 2013-06-23 DIAGNOSIS — M949 Disorder of cartilage, unspecified: Secondary | ICD-10-CM

## 2013-06-23 DIAGNOSIS — M179 Osteoarthritis of knee, unspecified: Secondary | ICD-10-CM | POA: Diagnosis present

## 2013-06-23 DIAGNOSIS — E785 Hyperlipidemia, unspecified: Secondary | ICD-10-CM | POA: Diagnosis present

## 2013-06-23 DIAGNOSIS — J309 Allergic rhinitis, unspecified: Secondary | ICD-10-CM | POA: Diagnosis present

## 2013-06-23 DIAGNOSIS — Z884 Allergy status to anesthetic agent status: Secondary | ICD-10-CM

## 2013-06-23 DIAGNOSIS — F411 Generalized anxiety disorder: Secondary | ICD-10-CM | POA: Diagnosis present

## 2013-06-23 DIAGNOSIS — Z803 Family history of malignant neoplasm of breast: Secondary | ICD-10-CM

## 2013-06-23 DIAGNOSIS — Z880 Allergy status to penicillin: Secondary | ICD-10-CM

## 2013-06-23 DIAGNOSIS — D500 Iron deficiency anemia secondary to blood loss (chronic): Secondary | ICD-10-CM | POA: Diagnosis not present

## 2013-06-23 DIAGNOSIS — M810 Age-related osteoporosis without current pathological fracture: Secondary | ICD-10-CM | POA: Diagnosis present

## 2013-06-23 DIAGNOSIS — J45909 Unspecified asthma, uncomplicated: Secondary | ICD-10-CM | POA: Diagnosis present

## 2013-06-23 DIAGNOSIS — Z888 Allergy status to other drugs, medicaments and biological substances status: Secondary | ICD-10-CM

## 2013-06-23 DIAGNOSIS — Z6830 Body mass index (BMI) 30.0-30.9, adult: Secondary | ICD-10-CM

## 2013-06-23 DIAGNOSIS — E669 Obesity, unspecified: Secondary | ICD-10-CM | POA: Diagnosis present

## 2013-06-23 DIAGNOSIS — M899 Disorder of bone, unspecified: Secondary | ICD-10-CM | POA: Diagnosis present

## 2013-06-23 DIAGNOSIS — Z882 Allergy status to sulfonamides status: Secondary | ICD-10-CM

## 2013-06-23 DIAGNOSIS — M1710 Unilateral primary osteoarthritis, unspecified knee: Secondary | ICD-10-CM

## 2013-06-23 HISTORY — PX: PARTIAL KNEE ARTHROPLASTY: SHX2174

## 2013-06-23 LAB — TYPE AND SCREEN
ABO/RH(D): O POS
Antibody Screen: NEGATIVE

## 2013-06-23 SURGERY — ARTHROPLASTY, KNEE, UNICOMPARTMENTAL
Anesthesia: Spinal | Site: Knee | Laterality: Right

## 2013-06-23 MED ORDER — TRIAMCINOLONE ACETONIDE 55 MCG/ACT NA AERO: 2.0000 | INHALATION_SPRAY | Freq: Every day | NASAL | Status: DC | PRN

## 2013-06-23 MED ORDER — POTASSIUM CHLORIDE 2 MEQ/ML IV SOLN: INTRAVENOUS | Status: DC

## 2013-06-23 MED ORDER — DEXAMETHASONE SODIUM PHOSPHATE 10 MG/ML IJ SOLN: 10.0000 mg | Freq: Once | INTRAMUSCULAR | Status: DC

## 2013-06-23 MED ORDER — BUPIVACAINE LIPOSOME 1.3 % IJ SUSP
20.0000 mL | Freq: Once | INTRAMUSCULAR | Status: AC
  Administered 2013-06-23: 20 mL
  Filled 2013-06-23: qty 20

## 2013-06-23 MED ORDER — ONDANSETRON HCL 4 MG/2ML IJ SOLN
INTRAMUSCULAR | Status: AC
  Filled 2013-06-23: qty 2

## 2013-06-23 MED ORDER — BUPIVACAINE-EPINEPHRINE 0.25% -1:200000 IJ SOLN
INTRAMUSCULAR | Status: DC | PRN
  Administered 2013-06-23: 30 mL

## 2013-06-23 MED ORDER — ONDANSETRON HCL 4 MG/2ML IJ SOLN
INTRAMUSCULAR | Status: DC | PRN
  Administered 2013-06-23: 4 mg via INTRAVENOUS

## 2013-06-23 MED ORDER — PROPOFOL 10 MG/ML IV BOLUS
INTRAVENOUS | Status: AC
  Filled 2013-06-23: qty 20

## 2013-06-23 MED ORDER — CEFAZOLIN SODIUM-DEXTROSE 2-3 GM-% IV SOLR
2.0000 g | Freq: Four times a day (QID) | INTRAVENOUS | Status: AC
  Administered 2013-06-23 (×2): 2 g via INTRAVENOUS
  Filled 2013-06-23 (×2): qty 50

## 2013-06-23 MED ORDER — SODIUM CHLORIDE 0.9 % IV SOLN: INTRAVENOUS | Status: DC

## 2013-06-23 MED ORDER — ONDANSETRON HCL 4 MG PO TABS: 4.0000 mg | ORAL_TABLET | Freq: Four times a day (QID) | ORAL | Status: DC | PRN

## 2013-06-23 MED ORDER — SODIUM CHLORIDE 0.9 % IV SOLN
INTRAVENOUS | Status: DC
  Administered 2013-06-23: 15:00:00 via INTRAVENOUS
  Filled 2013-06-23 (×4): qty 1000

## 2013-06-23 MED ORDER — DIPHENHYDRAMINE HCL 12.5 MG/5ML PO ELIX: 12.5000 mg | ORAL_SOLUTION | ORAL | Status: DC | PRN

## 2013-06-23 MED ORDER — METHOCARBAMOL 1000 MG/10ML IJ SOLN
500.0000 mg | Freq: Four times a day (QID) | INTRAVENOUS | Status: DC | PRN
  Filled 2013-06-23: qty 5

## 2013-06-23 MED ORDER — SODIUM CHLORIDE 0.9 % IV SOLN
1000.0000 mg | Freq: Once | INTRAVENOUS | Status: AC
  Administered 2013-06-23: 1000 mg via INTRAVENOUS
  Filled 2013-06-23: qty 10

## 2013-06-23 MED ORDER — ASPIRIN EC 325 MG PO TBEC
325.0000 mg | DELAYED_RELEASE_TABLET | Freq: Two times a day (BID) | ORAL | Status: DC
  Administered 2013-06-24: 325 mg via ORAL
  Filled 2013-06-23 (×3): qty 1

## 2013-06-23 MED ORDER — CHLORHEXIDINE GLUCONATE 4 % EX LIQD: 60.0000 mL | Freq: Once | CUTANEOUS | Status: DC

## 2013-06-23 MED ORDER — METOCLOPRAMIDE HCL 5 MG/ML IJ SOLN: 5.0000 mg | Freq: Three times a day (TID) | INTRAMUSCULAR | Status: DC | PRN

## 2013-06-23 MED ORDER — BUPIVACAINE IN DEXTROSE 0.75-8.25 % IT SOLN
INTRATHECAL | Status: DC | PRN
  Administered 2013-06-23: 1.8 mL via INTRATHECAL

## 2013-06-23 MED ORDER — RALOXIFENE HCL 60 MG PO TABS
60.0000 mg | ORAL_TABLET | Freq: Every morning | ORAL | Status: DC
  Administered 2013-06-23 – 2013-06-24 (×2): 60 mg via ORAL
  Filled 2013-06-23 (×2): qty 1

## 2013-06-23 MED ORDER — ACETAMINOPHEN 325 MG PO TABS: 650.0000 mg | ORAL_TABLET | Freq: Four times a day (QID) | ORAL | Status: DC | PRN

## 2013-06-23 MED ORDER — POLYETHYLENE GLYCOL 3350 17 G PO PACK: 17.0000 g | PACK | Freq: Every day | ORAL | Status: DC | PRN

## 2013-06-23 MED ORDER — OXYCODONE HCL 5 MG PO TABS
5.0000 mg | ORAL_TABLET | ORAL | Status: DC | PRN
  Administered 2013-06-23 – 2013-06-24 (×5): 10 mg via ORAL
  Filled 2013-06-23 (×5): qty 2

## 2013-06-23 MED ORDER — METHOCARBAMOL 500 MG PO TABS
500.0000 mg | ORAL_TABLET | Freq: Four times a day (QID) | ORAL | Status: DC | PRN
  Administered 2013-06-23 – 2013-06-24 (×3): 500 mg via ORAL
  Filled 2013-06-23 (×3): qty 1

## 2013-06-23 MED ORDER — ONDANSETRON HCL 4 MG/2ML IJ SOLN: 4.0000 mg | Freq: Four times a day (QID) | INTRAMUSCULAR | Status: DC | PRN

## 2013-06-23 MED ORDER — DEXAMETHASONE SODIUM PHOSPHATE 10 MG/ML IJ SOLN
INTRAMUSCULAR | Status: DC | PRN
  Administered 2013-06-23: 10 mg via INTRAVENOUS

## 2013-06-23 MED ORDER — HYDROMORPHONE HCL PF 1 MG/ML IJ SOLN: 0.2500 mg | INTRAMUSCULAR | Status: DC | PRN

## 2013-06-23 MED ORDER — HYDROCOD POLST-CHLORPHEN POLST 10-8 MG/5ML PO LQCR: 5.0000 mL | Freq: Every evening | ORAL | Status: DC | PRN

## 2013-06-23 MED ORDER — KETOROLAC TROMETHAMINE 30 MG/ML IJ SOLN
INTRAMUSCULAR | Status: DC | PRN
  Administered 2013-06-23: 30 mg

## 2013-06-23 MED ORDER — ALUM & MAG HYDROXIDE-SIMETH 200-200-20 MG/5ML PO SUSP: 30.0000 mL | ORAL | Status: DC | PRN

## 2013-06-23 MED ORDER — METOCLOPRAMIDE HCL 10 MG PO TABS: 5.0000 mg | ORAL_TABLET | Freq: Three times a day (TID) | ORAL | Status: DC | PRN

## 2013-06-23 MED ORDER — PROPOFOL INFUSION 10 MG/ML OPTIME
INTRAVENOUS | Status: DC | PRN
  Administered 2013-06-23: 75 ug/kg/min via INTRAVENOUS

## 2013-06-23 MED ORDER — CEFAZOLIN SODIUM-DEXTROSE 2-3 GM-% IV SOLR
2.0000 g | INTRAVENOUS | Status: AC
  Administered 2013-06-23: 2 g via INTRAVENOUS

## 2013-06-23 MED ORDER — SODIUM CHLORIDE 0.9 % IJ SOLN
INTRAMUSCULAR | Status: DC | PRN
  Administered 2013-06-23: 9 mL

## 2013-06-23 MED ORDER — PHENOL 1.4 % MT LIQD: 1.0000 | OROMUCOSAL | Status: DC | PRN

## 2013-06-23 MED ORDER — MENTHOL 3 MG MT LOZG
1.0000 | LOZENGE | OROMUCOSAL | Status: DC | PRN
  Filled 2013-06-23: qty 9

## 2013-06-23 MED ORDER — MIDAZOLAM HCL 2 MG/2ML IJ SOLN
INTRAMUSCULAR | Status: AC
  Filled 2013-06-23: qty 2

## 2013-06-23 MED ORDER — LACTATED RINGERS IV SOLN
INTRAVENOUS | Status: DC | PRN
  Administered 2013-06-23 (×3): via INTRAVENOUS

## 2013-06-23 MED ORDER — DEXAMETHASONE SODIUM PHOSPHATE 10 MG/ML IJ SOLN
INTRAMUSCULAR | Status: AC
  Filled 2013-06-23: qty 1

## 2013-06-23 MED ORDER — MIDAZOLAM HCL 5 MG/5ML IJ SOLN
INTRAMUSCULAR | Status: DC | PRN
  Administered 2013-06-23: 2 mg via INTRAVENOUS

## 2013-06-23 MED ORDER — PROPOFOL 10 MG/ML IV BOLUS
INTRAVENOUS | Status: DC | PRN
  Administered 2013-06-23: 30 mg via INTRAVENOUS

## 2013-06-23 MED ORDER — 0.9 % SODIUM CHLORIDE (POUR BTL) OPTIME
TOPICAL | Status: DC | PRN
  Administered 2013-06-23: 1000 mL

## 2013-06-23 MED ORDER — CEFAZOLIN SODIUM-DEXTROSE 2-3 GM-% IV SOLR
INTRAVENOUS | Status: AC
  Filled 2013-06-23: qty 50

## 2013-06-23 MED ORDER — PROMETHAZINE HCL 25 MG PO TABS
25.0000 mg | ORAL_TABLET | ORAL | Status: DC | PRN
Start: 2013-06-23 — End: 2013-06-24

## 2013-06-23 MED ORDER — DOCUSATE SODIUM 100 MG PO CAPS
100.0000 mg | ORAL_CAPSULE | Freq: Two times a day (BID) | ORAL | Status: DC
  Administered 2013-06-23 – 2013-06-24 (×2): 100 mg via ORAL

## 2013-06-23 MED ORDER — METOPROLOL SUCCINATE ER 50 MG PO TB24
50.0000 mg | ORAL_TABLET | Freq: Every day | ORAL | Status: DC
  Administered 2013-06-23: 50 mg via ORAL
  Filled 2013-06-23 (×2): qty 1

## 2013-06-23 MED ORDER — SORBITOL 70 % SOLN
30.0000 mL | Freq: Every day | Status: DC | PRN
  Filled 2013-06-23: qty 30

## 2013-06-23 MED ORDER — LACTATED RINGERS IV SOLN: INTRAVENOUS | Status: DC

## 2013-06-23 MED ORDER — FENTANYL CITRATE 0.05 MG/ML IJ SOLN
INTRAMUSCULAR | Status: DC | PRN
  Administered 2013-06-23: 50 ug via INTRAVENOUS

## 2013-06-23 MED ORDER — ACETAMINOPHEN 650 MG RE SUPP: 650.0000 mg | Freq: Four times a day (QID) | RECTAL | Status: DC | PRN

## 2013-06-23 MED ORDER — PROMETHAZINE HCL 25 MG/ML IJ SOLN: 6.2500 mg | INTRAMUSCULAR | Status: DC | PRN

## 2013-06-23 MED ORDER — FENTANYL CITRATE 0.05 MG/ML IJ SOLN
INTRAMUSCULAR | Status: AC
  Filled 2013-06-23: qty 2

## 2013-06-23 MED ORDER — BUPIVACAINE-EPINEPHRINE (PF) 0.25% -1:200000 IJ SOLN
INTRAMUSCULAR | Status: AC
  Filled 2013-06-23: qty 30

## 2013-06-23 MED ORDER — KETOROLAC TROMETHAMINE 30 MG/ML IJ SOLN
INTRAMUSCULAR | Status: AC
  Filled 2013-06-23: qty 1

## 2013-06-23 MED ORDER — HYDROMORPHONE HCL PF 1 MG/ML IJ SOLN: 0.5000 mg | INTRAMUSCULAR | Status: DC | PRN

## 2013-06-23 MED ORDER — SODIUM CHLORIDE 0.9 % IJ SOLN
INTRAMUSCULAR | Status: AC
  Filled 2013-06-23: qty 10

## 2013-06-23 SURGICAL SUPPLY — 52 items
ADH SKN CLS APL DERMABOND .7 (GAUZE/BANDAGES/DRESSINGS) ×1
BAG SPEC THK2 15X12 ZIP CLS (MISCELLANEOUS)
BAG ZIPLOCK 12X15 (MISCELLANEOUS) ×1 IMPLANT
BANDAGE ELASTIC 6 VELCRO ST LF (GAUZE/BANDAGES/DRESSINGS) ×3 IMPLANT
BANDAGE ESMARK 6X9 LF (GAUZE/BANDAGES/DRESSINGS) ×1 IMPLANT
BLADE SAW RECIPROCATING 77.5 (BLADE) ×3 IMPLANT
BLADE SAW SGTL 13.0X1.19X90.0M (BLADE) ×3 IMPLANT
BNDG CMPR 9X6 STRL LF SNTH (GAUZE/BANDAGES/DRESSINGS) ×1
BNDG ESMARK 6X9 LF (GAUZE/BANDAGES/DRESSINGS) ×3
BOWL SMART MIX CTS (DISPOSABLE) ×3 IMPLANT
CAPT KNEE OXFORD ×2 IMPLANT
CEMENT HV SMART SET (Cement) ×3 IMPLANT
CUFF TOURN SGL QUICK 34 (TOURNIQUET CUFF) ×3
CUFF TRNQT CYL 34X4X40X1 (TOURNIQUET CUFF) ×1 IMPLANT
DERMABOND ADVANCED (GAUZE/BANDAGES/DRESSINGS) ×2
DERMABOND ADVANCED .7 DNX12 (GAUZE/BANDAGES/DRESSINGS) ×1 IMPLANT
DRAPE EXTREMITY T 121X128X90 (DRAPE) ×3 IMPLANT
DRAPE POUCH INSTRU U-SHP 10X18 (DRAPES) ×3 IMPLANT
DRAPE U-SHAPE 47X51 STRL (DRAPES) ×3 IMPLANT
DRSG AQUACEL AG ADV 3.5X10 (GAUZE/BANDAGES/DRESSINGS) ×2 IMPLANT
DURAPREP 26ML APPLICATOR (WOUND CARE) ×6 IMPLANT
ELECT REM PT RETURN 9FT ADLT (ELECTROSURGICAL) ×3
ELECTRODE REM PT RTRN 9FT ADLT (ELECTROSURGICAL) ×1 IMPLANT
EVACUATOR 1/8 PVC DRAIN (DRAIN) ×3 IMPLANT
FACESHIELD WRAPAROUND (MASK) ×9 IMPLANT
FACESHIELD WRAPAROUND OR TEAM (MASK) ×4 IMPLANT
GLOVE BIOGEL PI IND STRL 7.5 (GLOVE) ×1 IMPLANT
GLOVE BIOGEL PI IND STRL 8 (GLOVE) ×1 IMPLANT
GLOVE BIOGEL PI INDICATOR 7.5 (GLOVE) ×2
GLOVE BIOGEL PI INDICATOR 8 (GLOVE) ×2
GLOVE ECLIPSE 8.0 STRL XLNG CF (GLOVE) ×3 IMPLANT
GLOVE ORTHO TXT STRL SZ7.5 (GLOVE) ×6 IMPLANT
GOWN SPEC L3 XXLG W/TWL (GOWN DISPOSABLE) ×3 IMPLANT
GOWN STRL REUS W/TWL LRG LVL3 (GOWN DISPOSABLE) ×3 IMPLANT
GOWN STRL REUS W/TWL XL LVL3 (GOWN DISPOSABLE) ×4 IMPLANT
KIT BASIN OR (CUSTOM PROCEDURE TRAY) ×3 IMPLANT
LEGGING LITHOTOMY PAIR STRL (DRAPES) ×3 IMPLANT
MANIFOLD NEPTUNE II (INSTRUMENTS) ×3 IMPLANT
NDL SAFETY ECLIPSE 18X1.5 (NEEDLE) ×1 IMPLANT
NEEDLE HYPO 18GX1.5 SHARP (NEEDLE) ×3
PACK TOTAL JOINT (CUSTOM PROCEDURE TRAY) ×3 IMPLANT
POSITIONER SURGICAL ARM (MISCELLANEOUS) ×3 IMPLANT
SUCTION FRAZIER TIP 10 FR DISP (SUCTIONS) ×3 IMPLANT
SUT MNCRL AB 4-0 PS2 18 (SUTURE) ×3 IMPLANT
SUT VIC AB 1 CT1 36 (SUTURE) ×3 IMPLANT
SUT VIC AB 2-0 CT1 27 (SUTURE) ×6
SUT VIC AB 2-0 CT1 TAPERPNT 27 (SUTURE) ×2 IMPLANT
SUT VLOC 180 0 24IN GS25 (SUTURE) ×3 IMPLANT
SYR 50ML LL SCALE MARK (SYRINGE) ×3 IMPLANT
TOWEL OR 17X26 10 PK STRL BLUE (TOWEL DISPOSABLE) ×3 IMPLANT
TOWEL OR NON WOVEN STRL DISP B (DISPOSABLE) ×2 IMPLANT
TRAY FOLEY CATH 14FRSI W/METER (CATHETERS) ×2 IMPLANT

## 2013-06-23 NOTE — Transfer of Care (Signed)
Immediate Anesthesia Transfer of Care Note  Patient: Alice Smith  Procedure(s) Performed: Procedure(s) (LRB): RIGHT KNEE MEDIAL UNICOMPARTMENTAL ARTHROPLASTY (Right)  Patient Location: PACU  Anesthesia Type: Spinal  Level of Consciousness: sedated, patient cooperative and responds to stimulation  Airway & Oxygen Therapy: Patient Spontanous Breathing and Patient connected to face mask oxgen  Post-op Assessment: Report given to PACU RN and Post -op Vital signs reviewed and stable  Post vital signs: Reviewed and stable  Complications: No apparent anesthesia complications Noted K-44 level on exam with no stated pain on assessment.

## 2013-06-23 NOTE — Anesthesia Preprocedure Evaluation (Addendum)
Anesthesia Evaluation  Patient identified by MRN, date of birth, ID band Patient awake    Reviewed: Allergy & Precautions, H&P , NPO status , Patient's Chart, lab work & pertinent test results  History of Anesthesia Complications (+) PROLONGED EMERGENCE and history of anesthetic complications  Airway Mallampati: II TM Distance: >3 FB Neck ROM: Full    Dental no notable dental hx.    Pulmonary neg pulmonary ROS, former smoker,  breath sounds clear to auscultation  Pulmonary exam normal       Cardiovascular Exercise Tolerance: Good negative cardio ROS  Rhythm:Regular Rate:Normal     Neuro/Psych  Headaches, Anxiety    GI/Hepatic Neg liver ROS, GERD-  ,  Endo/Other  negative endocrine ROS  Renal/GU negative Renal ROS  negative genitourinary   Musculoskeletal negative musculoskeletal ROS (+)   Abdominal   Peds negative pediatric ROS (+)  Hematology negative hematology ROS (+)   Anesthesia Other Findings   Reproductive/Obstetrics negative OB ROS                          Anesthesia Physical Anesthesia Plan  ASA: II  Anesthesia Plan: Spinal   Post-op Pain Management:    Induction: Intravenous  Airway Management Planned:   Additional Equipment:   Intra-op Plan:   Post-operative Plan: Extubation in OR  Informed Consent: I have reviewed the patients History and Physical, chart, labs and discussed the procedure including the risks, benefits and alternatives for the proposed anesthesia with the patient or authorized representative who has indicated his/her understanding and acceptance.   Dental advisory given  Plan Discussed with: CRNA  Anesthesia Plan Comments: (Discussed general and spinal. Discussed risks/benefits of spinal including headache, backache, failure, bleeding, infection, and nerve damage. Patient consents to spinal. Questions answered. Coagulation studies and platelet  count acceptable.  Had reaction to novocaine 25 years ago. No problem with local anesthetics at dentist in more recent years.)       Anesthesia Quick Evaluation

## 2013-06-23 NOTE — Interval H&P Note (Signed)
History and Physical Interval Note:  06/23/2013 7:07 AM  Alice Smith  has presented today for surgery, with the diagnosis of OA  MEDIAL RIGHT KNEE  The various methods of treatment have been discussed with the patient and family. After consideration of risks, benefits and other options for treatment, the patient has consented to  Procedure(s): RIGHT UNI  KNEE  ARTHOPLASTY MEDIALLY (Right) as a surgical intervention .  The patient's history has been reviewed, patient examined, no change in status, stable for surgery.  I have reviewed the patient's chart and labs.  Questions were answered to the patient's satisfaction.     Mauri Pole

## 2013-06-23 NOTE — Op Note (Signed)
NAME: Alice Smith    MEDICAL RECORD NO.: [id number]   FACILITY: [facility] OF BIRTH: 1945/08/11  PHYSICIAN: Pietro Cassis. Alvan Dame, M.D.    DATE OF PROCEDURE: 06/23/2013    OPERATIVE REPORT   PREOPERATIVE DIAGNOSIS: Right knee medial compartment osteoarthritis.   POSTOPERATIVE DIAGNOSIS: Right knee medial compartment osteoarthritis.  PROCEDURE: Right partial knee replacement utilizing Biomet Oxford knee  component, size X-small femur, a right medial size AA tibial tray with a size 3 insert.   SURGEON: Pietro Cassis. Alvan Dame, M.D.   ASSISTANT: Nehemiah Massed, [facility].  Please note that Alice. Mancel Smith was present for the entirety of the case,  utilized for preoperative positioning, perioperative retractor  management, general facilitation of the case and primary wound closure.   ANESTHESIA: spinal.   SPECIMENS: None.   COMPLICATIONS: None.  DRAINS: None   TOURNIQUET TIME: 33 minutes at 250 mmHg.   INDICATIONS FOR PROCEDURE: Alice Smith is a 68 yo female referred for surgical evaluation of right knee arthritis and pain.  They presented with primary complaints of pain on the medial side of their knee. Radiographs revealed advanced medial compartment arthritis with specifically an antero-medial wear pattern.  There was bone on bone changes noted with subchondral sclerosis and osteophytes present. The patient has had progressive problems failing to respond to conservative measures of medications, injections and activity modification. Risks of infection, DVT, component failure, need for future revision surgery were all discussed and reviewed.  Consent was obtained for benefit of pain relief.   PROCEDURE IN DETAIL: The patient was brought to the operative theater.  Once adequate anesthesia, preoperative antibiotics, 2gm of Ancef administered, the patient was positioned in supine position with a right thigh tourniquet  placed. The right lower extremity was prepped and draped in sterile  fashion with the  leg on the Oxford leg holder.  The leg was allowed to flex to 120 degrees. A time-out  was performed identifying the patient, planned procedure, and extremity.  The leg was exsanguinated, tourniquet elevated to 250 mmHg. A midline  incision was made from the proximal pole of the patella to the tibial tubercle. A  soft tissue plane was created and partial median arthrotomy was then  made to allow for subluxation of the patella. Following initial synovectomy and  debridement, the osteophytes were removed off the medial aspect of the  knee.   Attention was first directed to the tibia. The tibial  extramedullary guide was positioned over the anterior crest of the tibia  and pinned into position, and using a measured resection guide from the  [place] system, a 4 mm resection was made off the proximal tibia. First  the reciprocating saw along the medial aspect of the tibial spines, then the oscillating saw.    At this point, I sized this cut surface seem to be best fit for a size AA tibial tray.  With the retractors out of the wound and the knee held at 90 degrees the size 3-4  feeler gauge had appropriate tension on the medial ligament.   At this point, the femoral canal was opened with a drill and the  intramedullary rod passed. Then using the guide for a x-small posterior resection off  the posterior aspect of the femur was positioned over the mid portion of the medial femoral condyle.  The orientation was set using the guide that mates the femoral guide to the intramedullary rod.  The 2 drill holes were made into the distal femur.  The posterior  guide was then impacted into place and the posterior  femoral cut made.  At this point, I milled the distal femur with a size 4 spigot in place. At this point, we did a trial reduction of the x-small femur, size AA tibial tray and a size 3 feeler gauget. At 20 degrees of flexion the knee was tighter than in 90 degrees of flexion.  It was matched with the  size 2 feeler gauge.   Given the difference in the tension between the knee in 90 degrees versus that in 20 degrees I had to place the 5 spigot into the femur and re-mill the distal femur.  Remaining bone was removed and debrided.  I repeated the trial reduction with the 3 feeler gauge and found that now at both 90 degrees and 20 degrees the knee ligament were tension symmetrically.  Given these findings, the trial femoral component was removed. Final preparation of tibia was carried out by pinning it in position. Then  using a reciprocating saw I removed bone for the keel. Further bone was  removed with an osteotome.  Trial reduction was now carried out with the X-small femur, the AA tibia, and a size 3 lollipop insert. The balance of the  ligaments appeared to be symmetric at 20 degrees and 90 degrees. Given  all these findings, the trial components were removed.   Cement was mixed. The final components were opened. The knee was irrigated with  normal saline solution. Then final debridements of the  soft tissue was carried out, I also drilled the sclerotic bone with a drill.  The final components were cemented with a single batch of cement in a  two-stage technique with the tibial component cemented first. The knee  was then brought  to 45 degrees of flexion with a 4 feeler gauge, held with pressure for a minute and half.  After this the femoral component was cemented in place.  The knee was again held at 45 degrees of flexion while the cement fully cured.  Excess cement was removed throughout the knee. Tourniquet was let down  after 33 minutes. After the cement had fully cured and excessive cement  was removed throughout the knee there was no visualized cement present.   The final size 3 insert mm was chosen and snapped into position. We re-irrigated  the knee. The extensor mechanism  was then reapproximated using a #1 Vicryl with the knee in flexion. The  remaining wound was closed with  2-0 Vicryl and a running 4-0 Monocryl.  The knee was cleaned, dried, and dressed sterilely using Dermabond and  Aquacel dressing. The patient was brought to the recovery room, Ace wrap in place, tolerating the  procedure well. He will be in the hospital for overnight observation.  We will initiate physical therapy and progress to ambulate.     Pietro Cassis Alvan Dame, M.D.

## 2013-06-23 NOTE — Anesthesia Procedure Notes (Signed)
Spinal  Patient location during procedure: OR Start time: 06/23/2013 10:05 AM End time: 06/23/2013 10:10 AM Staffing CRNA/Resident: Anne Fu Performed by: resident/CRNA  Preanesthetic Checklist Completed: patient identified, site marked, surgical consent, pre-op evaluation, timeout performed, IV checked, risks and benefits discussed and monitors and equipment checked Spinal Block Patient position: sitting Prep: Betadine Patient monitoring: heart rate, continuous pulse ox and blood pressure Approach: right paramedian Location: L2-3 Injection technique: single-shot Needle Needle type: Spinocan  Needle gauge: 22 G Needle length: 9 cm Assessment Sensory level: T4 Additional Notes Expiration date of kit checked and confirmed. Patient tolerated procedure well, without complications. X 1 attempt with noted clear CSF return easy aspiration and administration of medication.  Noted loss of motor and sensory on exam.

## 2013-06-23 NOTE — Anesthesia Postprocedure Evaluation (Signed)
  Anesthesia Post-op Note  Patient: Alice Smith  Procedure(s) Performed: Procedure(s) (LRB): RIGHT KNEE MEDIAL UNICOMPARTMENTAL ARTHROPLASTY (Right)  Patient Location: PACU  Anesthesia Type: Spinal  Level of Consciousness: awake and alert   Airway and Oxygen Therapy: Patient Spontanous Breathing  Post-op Pain: mild  Post-op Assessment: Post-op Vital signs reviewed, Patient's Cardiovascular Status Stable, Respiratory Function Stable, Patent Airway and No signs of Nausea or vomiting  Last Vitals:  Filed Vitals:   06/23/13 1315  BP: 115/72  Pulse: 78  Temp: 36.3 C  Resp: 16    Post-op Vital Signs: stable   Complications: No apparent anesthesia complications

## 2013-06-23 NOTE — Evaluation (Signed)
Physical Therapy Evaluation Patient Details Name: Alice Smith MRN: [id number] DOB: 1945/11/16 Today's Date: 06/23/2013   History of Present Illness  RUKR  Clinical Impression   Pt reports R foot still  Numb but has good quad contraction. Ambulated x 40' . Pt will benefit from PT to address problems listed.    Follow Up Recommendations Home health PT    Equipment Recommendations  Rolling walker with 5" wheels    Recommendations for Other Services       Precautions / Restrictions Precautions Precautions: Knee;Fall      Mobility  Bed Mobility Overal bed mobility: Needs Assistance Bed Mobility: Supine to Sit     Supine to sit: Min assist     General bed mobility comments: cues for sequence  Transfers Overall transfer level: Needs assistance Equipment used: Rolling walker (2 wheeled) Transfers: Sit to/from Stand Sit to Stand: Min assist         General transfer comment: cues for hand and R leg position  Ambulation/Gait Ambulation/Gait assistance: +2 safety/equipment;Min assist Ambulation Distance (Feet): 40 Feet Assistive device: Rolling walker (2 wheeled) Gait Pattern/deviations: Step-to pattern     General Gait Details: cues for safety, sequence posture  Stairs            Wheelchair Mobility    Modified Rankin (Stroke Patients Only)       Balance                                             Pertinent Vitals/Pain R knee aching after amb, ice applied    Home Living Family/patient expects to be discharged to:: Private residence Living Arrangements: Spouse/significant other Available Help at Discharge: Family Type of Home: House Home Access: Stairs to enter Entrance Stairs-Rails: None Technical brewer of Steps: 3 Home Layout: One level Home Equipment: None      Prior Function Level of Independence: Independent               Hand Dominance        Extremity/Trunk Assessment   Upper Extremity  Assessment: Overall WFL for tasks assessed           Lower Extremity Assessment: RLE deficits/detail RLE Deficits / Details: able to SLR, Foot/heel is numb and tingling       Communication   Communication: No difficulties  Cognition Arousal/Alertness: Awake/alert Behavior During Therapy: WFL for tasks assessed/performed Overall Cognitive Status: Within Functional Limits for tasks assessed                      General Comments      Exercises        Assessment/Plan    PT Assessment Patient needs continued PT services  PT Diagnosis Difficulty walking;Acute pain   PT Problem List Decreased strength;Decreased range of motion;Decreased activity tolerance;Decreased mobility;Decreased knowledge of precautions;Decreased safety awareness;Decreased knowledge of use of DME;Pain  PT Treatment Interventions DME instruction;Gait training;Stair training;Functional mobility training;Therapeutic activities;Therapeutic exercise;Patient/family education   PT Goals (Current goals can be found in the Care Plan section) Acute Rehab PT Goals Patient Stated Goal: to walk without pain PT Goal Formulation: With patient Time For Goal Achievement: 06/25/13 Potential to Achieve Goals: Good    Frequency 7X/week   Barriers to discharge        Co-evaluation  End of Session Equipment Utilized During Treatment: Gait belt Activity Tolerance: Patient tolerated treatment well Patient left: in chair;with call bell/phone within reach Nurse Communication: Mobility status;Patient requests pain meds         Time: 1556-1620 PT Time Calculation (min): 24 min   Charges:   PT Evaluation $Initial PT Evaluation Tier I: 1 Procedure PT Treatments $Gait Training: 23-37 mins   PT G Codes:          Claretha Cooper 06/23/2013, 5:25 PM Tresa Endo PT [phone]

## 2013-06-24 DIAGNOSIS — E669 Obesity, unspecified: Secondary | ICD-10-CM | POA: Diagnosis present

## 2013-06-24 DIAGNOSIS — D500 Iron deficiency anemia secondary to blood loss (chronic): Secondary | ICD-10-CM | POA: Diagnosis not present

## 2013-06-24 LAB — BASIC METABOLIC PANEL
BUN: 14 mg/dL (ref 6–23)
CO2: 21 meq/L (ref 19–32)
Calcium: 8.9 mg/dL (ref 8.4–10.5)
Chloride: 105 mEq/L (ref 96–112)
Creatinine, Ser: 0.67 mg/dL (ref 0.50–1.10)
GFR calc Af Amer: >90 mL/min (ref >90)
GFR, EST NON AFRICAN AMERICAN: 88 mL/min — AB (ref >90)
Glucose, Bld: 150 mg/dL — ABNORMAL HIGH (ref 70–99)
POTASSIUM: 4.3 meq/L (ref 3.7–5.3)
SODIUM: 137 meq/L (ref 137–147)

## 2013-06-24 LAB — CBC
HCT: 33.7 % — ABNORMAL LOW (ref 36.0–46.0)
Hemoglobin: 11.5 g/dL — ABNORMAL LOW (ref 12.0–15.0)
MCH: 30.2 pg (ref 26.0–34.0)
MCHC: 34.1 g/dL (ref 30.0–36.0)
MCV: 88.5 fL (ref 78.0–100.0)
Platelets: 246 10*3/uL (ref 150–400)
RBC: 3.81 MIL/uL — ABNORMAL LOW (ref 3.87–5.11)
RDW: 12.7 % (ref 11.5–15.5)
WBC: 12.4 10*3/uL — AB (ref 4.0–10.5)

## 2013-06-24 MED ORDER — ASPIRIN 325 MG PO TBEC: 325.0000 mg | DELAYED_RELEASE_TABLET | Freq: Two times a day (BID) | ORAL | Status: AC

## 2013-06-24 MED ORDER — DSS 100 MG PO CAPS: 100.0000 mg | ORAL_CAPSULE | Freq: Two times a day (BID) | ORAL | Status: DC

## 2013-06-24 MED ORDER — OXYCODONE HCL 5 MG PO TABS: 5.0000 mg | ORAL_TABLET | ORAL | Status: DC | PRN

## 2013-06-24 MED ORDER — CYCLOBENZAPRINE HCL 10 MG PO TABS: 5.0000 mg | ORAL_TABLET | Freq: Three times a day (TID) | ORAL | Status: DC | PRN

## 2013-06-24 MED ORDER — POLYETHYLENE GLYCOL 3350 17 G PO PACK: 17.0000 g | PACK | Freq: Every day | ORAL | Status: DC | PRN

## 2013-06-24 NOTE — Progress Notes (Signed)
Physical Therapy Treatment Patient Details Name: Alice Smith MRN: [id number] DOB: 10/30/1945 Today's Date: 06/26/2013    History of Present Illness RUKR    PT Comments    Progressing well.  Will review stairs with spouse when he arrives later this am.  Follow Up Recommendations  Home health PT     Equipment Recommendations  Rolling walker with 5" wheels    Recommendations for Other Services OT consult     Precautions / Restrictions Precautions Precautions: None Restrictions Weight Bearing Restrictions: No    Mobility  Bed Mobility Overal bed mobility: Needs Assistance Bed Mobility: Supine to Sit     Supine to sit: Min assist     General bed mobility comments: cues for sequence  Transfers Overall transfer level: Needs assistance Equipment used: Rolling walker (2 wheeled) Transfers: Sit to/from Stand Sit to Stand: Min assist         General transfer comment: cues for hand and R leg position  Ambulation/Gait Ambulation/Gait assistance: Min assist Ambulation Distance (Feet): 75 Feet Assistive device: Rolling walker (2 wheeled) Gait Pattern/deviations: Step-to pattern;Decreased step length - right;Decreased step length - left;Shuffle;Trunk flexed     General Gait Details: cues for safety, sequence posture   Stairs Stairs: Yes Stairs assistance: Min assist Stair Management: No rails;Step to pattern;Backwards;With walker Number of Stairs: 2 General stair comments: cues for sequence and foot/RW placement  Wheelchair Mobility    Modified Rankin (Stroke Patients Only)       Balance                                    Cognition Arousal/Alertness: Awake/alert Behavior During Therapy: WFL for tasks assessed/performed Overall Cognitive Status: Within Functional Limits for tasks assessed                      Exercises Total Joint Exercises Ankle Circles/Pumps: AROM;Both;15 reps;Supine Quad Sets: AROM;Both;10  reps;Supine Heel Slides: AAROM;15 reps;Right;Supine Straight Leg Raises: AAROM;PROM;Left;15 reps;Supine Goniometric ROM: AAROM at knee -10 - 65    General Comments        Pertinent Vitals/Pain 6/10; premed, cold packs provided    Home Living Family/patient expects to be discharged to:: Private residence Living Arrangements: Spouse/significant other Available Help at Discharge: Family Type of Home: House Home Access: Stairs to enter Entrance Stairs-Rails: None Home Layout: One level Home Equipment: None      Prior Function Level of Independence: Independent          PT Goals (current goals can now be found in the care plan section) Acute Rehab PT Goals Patient Stated Goal: to walk without pain PT Goal Formulation: With patient Time For Goal Achievement: 06/25/13 Potential to Achieve Goals: Good Progress towards PT goals: Progressing toward goals    Frequency  7X/week    PT Plan Current plan remains appropriate    Co-evaluation             End of Session Equipment Utilized During Treatment: Gait belt Activity Tolerance: Patient tolerated treatment well Patient left: in chair;with call bell/phone within reach     Time: 0847-0923 PT Time Calculation (min): 36 min  Charges:  $Gait Training: 8-22 mins $Therapeutic Exercise: 8-22 mins                    G Codes:      Mathis Fare 06-26-13, 12:12 PM

## 2013-06-24 NOTE — Evaluation (Signed)
Occupational Therapy Evaluation Patient Details Name: ATIYANA WELTE MRN: [id number] DOB: 04/22/1945 Today's Date: 06/24/2013    History of Present Illness RUKR   Clinical Impression   All education complete regarding ADL activity s/p RUKR.   Follow Up Recommendations  No OT follow up    Equipment Recommendations  3 in 1 bedside comode       Precautions / Restrictions Precautions Precautions: None Restrictions Weight Bearing Restrictions: No      Mobility Bed Mobility Overal bed mobility: Needs Assistance Bed Mobility: Supine to Sit     Supine to sit: Min assist Sit to supine: Min guard   General bed mobility comments: cues for sequence  Transfers Overall transfer level: Needs assistance Equipment used: Rolling walker (2 wheeled) Transfers: Sit to/from Stand Sit to Stand: Min assist         General transfer comment: cues for hand and R leg position    Balance                                            ADL Overall ADL's : Needs assistance/impaired     Grooming: Set up;Sitting   Upper Body Bathing: Supervision/ safety   Lower Body Bathing: Minimal assistance;Sit to/from stand   Upper Body Dressing : Set up;Sitting   Lower Body Dressing: Minimal assistance;Sit to/from stand   Toilet Transfer: Min guard   [place] and Hygiene: Min guard;Sit to/from stand       Functional mobility during ADLs: Min guard       Vision                     Perception     Praxis            Hand Dominance     Extremity/Trunk Assessment Upper Extremity Assessment Upper Extremity Assessment: Overall WFL for tasks assessed           Communication Communication Communication: No difficulties   Cognition Arousal/Alertness: Awake/alert Behavior During Therapy: WFL for tasks assessed/performed Overall Cognitive Status: Within Functional Limits for tasks assessed                     General  Comments       Exercises Exercises: Total Joint     Shoulder Instructions      Home Living Family/patient expects to be discharged to:: Private residence Living Arrangements: Spouse/significant other Available Help at Discharge: Family Type of Home: House Home Access: Stairs to enter Technical brewer of Steps: 3 Entrance Stairs-Rails: None Home Layout: One level     Bathroom Shower/Tub: Teacher, early years/pre: [place]: None          Prior Functioning/Environment Level of Independence: Independent             OT Diagnosis: Generalized weakness   OT Problem List: Decreased strength;Decreased activity tolerance   OT Treatment/Interventions: Self-care/ADL training;DME and/or AE instruction;Patient/family education    OT Goals(Current goals can be found in the care plan section) Acute Rehab OT Goals Patient Stated Goal: to walk without pain  OT Frequency: Min 2X/week   Barriers to D/C:            Co-evaluation              End of Session Equipment Utilized During Treatment: Rolling  walker Nurse Communication: Mobility status  Activity Tolerance: Patient tolerated treatment well Patient left: in chair;with family/visitor present   Time: 1135-1147 OT Time Calculation (min): 12 min Charges:  OT General Charges $OT Visit: 1 Procedure OT Evaluation $Initial OT Evaluation Tier I: 1 Procedure OT Treatments $Self Care/Home Management : 8-22 mins G-Codes:    Betsy Pries 06-25-13, 12:40 PM

## 2013-06-24 NOTE — Progress Notes (Signed)
Physical Therapy Treatment Patient Details Name: Alice Smith MRN: [id number] DOB: 1945-09-19 Today's Date: Jul 15, 2013    History of Present Illness RUKR    PT Comments    Stairs reviewed with pt spouse.  Pt states she feels ready for d/c   Follow Up Recommendations  Home health PT     Equipment Recommendations  Rolling walker with 5" wheels    Recommendations for Other Services OT consult     Precautions / Restrictions Precautions Precautions: None Restrictions Weight Bearing Restrictions: No    Mobility  Bed Mobility Overal bed mobility: Needs Assistance Bed Mobility: Supine to Sit     Supine to sit: Min assist Sit to supine: Min guard   General bed mobility comments: cues for sequence  Transfers Overall transfer level: Needs assistance Equipment used: Rolling walker (2 wheeled) Transfers: Sit to/from Stand Sit to Stand: Min assist         General transfer comment: cues for hand and R leg position  Ambulation/Gait Ambulation/Gait assistance: Min guard Ambulation Distance (Feet): 5 Feet Assistive device: Rolling walker (2 wheeled) Gait Pattern/deviations: Step-to pattern;Decreased step length - right;Decreased step length - left;Shuffle;Trunk flexed     General Gait Details: cues for safety, sequence posture   Stairs Stairs: Yes Stairs assistance: Min assist Stair Management: No rails;Step to pattern;Backwards;With walker Number of Stairs: 2 General stair comments: stairs reviewed with spouse  Wheelchair Mobility    Modified Rankin (Stroke Patients Only)       Balance                                    Cognition Arousal/Alertness: Awake/alert Behavior During Therapy: WFL for tasks assessed/performed Overall Cognitive Status: Within Functional Limits for tasks assessed                      Exercises Total Joint Exercises Ankle Circles/Pumps: AROM;Both;15 reps;Supine Quad Sets: AROM;Both;10  reps;Supine Heel Slides: AAROM;15 reps;Right;Supine Straight Leg Raises: AAROM;PROM;Left;15 reps;Supine Goniometric ROM: AAROM at knee -10 - 65    General Comments        Pertinent Vitals/Pain 5/10; premed    Home Living Family/patient expects to be discharged to:: Private residence Living Arrangements: Spouse/significant other Available Help at Discharge: Family Type of Home: House Home Access: Stairs to enter Entrance Stairs-Rails: None Home Layout: One level Home Equipment: None      Prior Function Level of Independence: Independent          PT Goals (current goals can now be found in the care plan section) Acute Rehab PT Goals Patient Stated Goal: to walk without pain PT Goal Formulation: With patient Time For Goal Achievement: 06/25/13 Potential to Achieve Goals: Good Progress towards PT goals: Progressing toward goals    Frequency  7X/week    PT Plan Current plan remains appropriate    Co-evaluation             End of Session Equipment Utilized During Treatment: Gait belt Activity Tolerance: Patient tolerated treatment well Patient left: in bed;with call bell/phone within reach;with family/visitor present     Time: 2585-2778 PT Time Calculation (min): 17 min  Charges:  $Gait Training: 8-22 mins $Therapeutic Exercise: 8-22 mins $Therapeutic Activity: 8-22 mins                    G Codes:      Mathis Fare 07/15/2013, 12:19 PM

## 2013-06-24 NOTE — Progress Notes (Signed)
Utilization review completed.  

## 2013-06-24 NOTE — Care Management Note (Signed)
    Page 1 of 2   06/24/2013     9:44:09 AM CARE MANAGEMENT NOTE 06/24/2013  Patient:  Alice Smith,Alice Smith   Account Number:  [id number]  Date Initiated:  06/24/2013  Documentation initiated by:  ,  Subjective/Objective Assessment:   adm: Right knee medial compartment OA/pain; RIGHT KNEE MEDIAL UNICOMPARTMENTAL ARTHROPLASTY     Action/Plan:   discharge planning   Anticipated DC Date:  06/25/2013   Anticipated DC Plan:  HOME W HOME HEALTH SERVICES      DC Planning Services  CM consult      PAC Choice  HOME HEALTH   Choice offered to / List presented to:  C-1 Patient   DME arranged  WALKER - ROLLING      DME agency  [facility]     HH arranged  HH-2 PT      HH agency  [facility]   Status of service:  Completed, signed off Medicare Important Message given?   (If response is "NO", the following Medicare IM given date fields will be blank) Date Medicare IM given:   Date Additional Medicare IM given:    Discharge Disposition:  HOME W HOME HEALTH SERVICES  Per UR Regulation:    If discussed at Long Length of Stay Meetings, dates discussed:    Comments:  06/24/13 0915 CM met with pt in room to offer choice for home health services.  Pt chooses [facility] for HHPT.  Address and contact information verified with pt.  Referral given to [facility] rep, on unit.  3n1 and rolling walker willl be delivered to room prior to discharge.  No other CM needs communicated.   , BSN, CM [phone].   

## 2013-06-24 NOTE — Progress Notes (Signed)
   Subjective: 1 Day Post-Op Procedure(s) (LRB): RIGHT KNEE MEDIAL UNICOMPARTMENTAL ARTHROPLASTY (Right)   Patient reports pain as mild, pain controlled. No events throughout the night. Ready to be discharged home.  Objective:   VITALS:   Filed Vitals:   06/24/13 1000  BP: 124/76  Pulse: 77  Temp: 98.7 F (37.1 C)  Resp: 20    Neurovascular intact Dorsiflexion/Plantar flexion intact Incision: dressing C/D/I No cellulitis present Compartment soft  LABS  Recent Labs  06/24/13 0520  HGB 11.5*  HCT 33.7*  WBC 12.4*  PLT 246     Recent Labs  06/24/13 0520  NA 137  K 4.3  BUN 14  CREATININE 0.67  GLUCOSE 150*     Assessment/Plan: 1 Day Post-Op Procedure(s) (LRB): RIGHT KNEE MEDIAL UNICOMPARTMENTAL ARTHROPLASTY (Right) Foley cath d/c'ed Advance diet Up with therapy D/C IV fluids Discharge home with home health Follow [facility] in 2 weeks [facility]. Follow up with OLIN,Deray Dawes D in 2 weeks.  Contact information:  [facility] [address] [place] [phone]    Expected ABLA  Treated with iron and will observe  Obese (BMI 30-39.9) Estimated body mass index is 30.9 kg/(m^2) as calculated from the following:   Height as of this encounter: 5\' 2"  (1.575 m).   Weight as of this encounter: 76.658 kg (169 lb). Patient also counseled that weight may inhibit the healing process Patient counseled that losing weight will help with future health issues        West Pugh. Tremon Sainvil   PAC  06/24/2013, 10:29 AM

## 2013-06-25 ENCOUNTER — Encounter (HOSPITAL_COMMUNITY): Payer: Self-pay | Admitting: Orthopedic Surgery

## 2013-06-29 NOTE — Discharge Summary (Signed)
Physician Discharge Summary  Patient ID: Alice Smith MRN: [id number] DOB/AGE: [birth year] 68 y.o.  Admit date: 06/23/2013 Discharge date: 06/24/2013   Procedures:  Procedure(s) (LRB): RIGHT KNEE MEDIAL UNICOMPARTMENTAL ARTHROPLASTY (Right)  Attending Physician:  Dr. Paralee Cancel   Admission Diagnoses:   Right knee medial compartment OA / pain  Discharge Diagnoses:  Active Problems:   DJD (degenerative joint disease) of knee   Obese   Expected blood loss anemia  Past Medical History  Diagnosis Date  . Migraine     evaluated with Dr. Domingo Cocking 11/2009  . Breast mass 05/2001    benign  . Complication of anesthesia     slow to wake up  . Dry cough     at night  . Arthritis   . Osteopenia   . Hemorrhoids     HPI: Alice Smith, 68 y.o. female, has a history of pain and functional disability in the right knee due to arthritis and has failed non-surgical conservative treatments for greater than 12 weeks to include NSAID's and/or analgesics, use of assistive devices and activity modification. Onset of symptoms was abrupt, starting 8 months ago with gradually worsening course since that time. The patient noted prior procedures on the knee to include arthroscopy on the right knee(s). Patient currently rates pain in the right knee(s) at 10 out of 10 with activity. Patient has night pain, worsening of pain with activity and weight bearing, pain that interferes with activities of daily living, pain with passive range of motion, crepitus and joint swelling. Patient has evidence of periarticular osteophytes and joint space narrowing of the medial compartment by imaging studies. There is no active infection. Risks, benefits and expectations were discussed with the patient. Risks including but not limited to the risk of anesthesia, blood clots, nerve damage, blood vessel damage, failure of the prosthesis, infection and up to and including death. Patient understand the risks, benefits and  expectations and wishes to proceed with surgery.   PCP: Elsie Stain, MD   Discharged Condition: good  Hospital Course:  Patient underwent the above stated procedure on 06/23/2013. Patient tolerated the procedure well and brought to the recovery room in good condition and subsequently to the floor.  POD #1 BP: 124/76 ; Pulse: 77 ; Temp: 98.7 F (37.1 C) ; Resp: 20  Patient reports pain as mild, pain controlled. No events throughout the night. Ready to be discharged home. Neurovascular intact, dorsiflexion/plantar flexion intact, incision: dressing C/D/I, no cellulitis present and compartment soft.   LABS  Basename    HGB  11.5  HCT  33.7    Discharge Exam: General appearance: alert, cooperative and no distress Extremities: Homans sign is negative, no sign of DVT, no edema, redness or tenderness in the calves or thighs and no ulcers, gangrene or trophic changes  Disposition:   Home with follow up in 2 weeks   Follow-up Information   Follow up with [facility]. (home health physical therapy)    Contact information:   [address] [place] [address] [phone]       Follow up with Mauri Pole, MD. Schedule an appointment as soon as possible for a visit in 2 weeks.   Specialty:  Orthopedic Surgery   Contact information:   [address] [place] [address] [phone]       Discharge Orders   Future Orders Complete By Expires   Call MD / Call 911  As directed    Change  dressing  As directed    Constipation Prevention  As directed    Diet - low sodium heart healthy  As directed    Discharge instructions  As directed    Driving restrictions  As directed    Increase activity slowly as tolerated  As directed    TED hose  As directed    Weight bearing as tolerated  As directed    Questions:     Laterality:  Right   Extremity:  Lower        Medication List    STOP taking these medications       aspirin 325 MG  tablet  Replaced by:  aspirin 325 MG EC tablet     HYDROmorphone 2 MG tablet  Commonly known as:  DILAUDID     indomethacin 25 MG capsule  Commonly known as:  INDOCIN      TAKE these medications       acetaminophen 325 MG tablet  Commonly known as:  TYLENOL  Take 325 mg by mouth every 6 (six) hours as needed for mild pain.     aspirin 325 MG EC tablet  Take 1 tablet (325 mg total) by mouth 2 (two) times daily.     Biotin 5000 MCG Caps  Take 2 capsules by mouth.     CALCIUM 600-D 600-400 MG-UNIT per tablet  Generic drug:  Calcium Carbonate-Vitamin D  Take 1 tablet by mouth daily.     chlorpheniramine-HYDROcodone 10-8 MG/5ML Lqcr  Commonly known as:  TUSSIONEX  Take 5 mLs by mouth at bedtime as needed for cough.     cholecalciferol 1000 UNITS tablet  Commonly known as:  VITAMIN D  Take 1,000 Units by mouth daily.     COSAMIN DS 500-400 MG Caps  Generic drug:  Glucosamine-Chondroitin  Take 2 tablets by mouth 2 (two) times daily.     cyanocobalamin 1000 MCG tablet  Take 100 mcg by mouth daily.     cyclobenzaprine 10 MG tablet  Commonly known as:  FLEXERIL  Take 0.5-1 tablets (5-10 mg total) by mouth 3 (three) times daily as needed for muscle spasms.     DSS 100 MG Caps  Take 100 mg by mouth 2 (two) times daily.     FISH OIL PO  Take 1 capsule by mouth daily.     metoprolol succinate 25 MG 24 hr tablet  Commonly known as:  TOPROL-XL  Take 50 mg by mouth every evening.     multivitamin tablet  Take 1 tablet by mouth daily.     NASACORT ALLERGY 24HR 55 MCG/ACT Aero nasal inhaler  Generic drug:  triamcinolone  Place 2 sprays into the nose daily.     oxyCODONE 5 MG immediate release tablet  Commonly known as:  Oxy IR/ROXICODONE  Take 1-2 tablets (5-10 mg total) by mouth every 4 (four) hours as needed for breakthrough pain.     polyethylene glycol packet  Commonly known as:  MIRALAX / GLYCOLAX  Take 17 g by mouth daily as needed for mild constipation.      PROBIOTIC & ACIDOPHILUS EX ST PO  Take 1 tablet by mouth daily.     promethazine 25 MG tablet  Commonly known as:  PHENERGAN  Take 25 mg by mouth every 8 (eight) hours as needed for nausea or vomiting.     raloxifene 60 MG tablet  Commonly known as:  EVISTA  Take 60 mg by mouth every morning.  Signed: West Pugh. Milan Perkins   PAC  06/29/2013, 11:01 AM

## 2013-07-24 ENCOUNTER — Other Ambulatory Visit: Payer: Self-pay | Admitting: Family Medicine

## 2013-07-24 NOTE — Telephone Encounter (Signed)
Electronic refill request. Last Filled:   30 tablet 0 06/24/2013.  Please advise.

## 2013-07-26 NOTE — Telephone Encounter (Signed)
Sent!

## 2013-07-28 ENCOUNTER — Other Ambulatory Visit: Payer: Self-pay | Admitting: Family Medicine

## 2013-08-15 ENCOUNTER — Other Ambulatory Visit: Payer: Self-pay | Admitting: Family Medicine

## 2013-08-17 ENCOUNTER — Encounter (HOSPITAL_COMMUNITY): Payer: Self-pay | Admitting: Pharmacy Technician

## 2013-08-18 ENCOUNTER — Other Ambulatory Visit: Payer: Self-pay

## 2013-08-18 NOTE — Telephone Encounter (Signed)
Pt left /vm requesting rx tussionex; pt will be going out of town early AM on 08/20/13 and would like to pick up rx on 08/19/13. Pt had knee surgery and pt was on med for knee surgery and pt did not cough often. Now pt is stopping knee med and pt starting to cough again.Please advise. Pt request cb when rx ready for pick up.

## 2013-08-19 MED ORDER — HYDROCOD POLST-CHLORPHEN POLST 10-8 MG/5ML PO LQCR: 5.0000 mL | Freq: Every evening | ORAL | Status: DC | PRN

## 2013-08-19 NOTE — Telephone Encounter (Signed)
Pt left v/m returning call; pt notified as instructed rx is at front desk for pick up. Pt voiced understanding.

## 2013-08-19 NOTE — Telephone Encounter (Signed)
Printed.  Thanks.  

## 2013-08-19 NOTE — Telephone Encounter (Signed)
lmovm for pt to return my call--Rx is in front office ready for pt to pick up

## 2013-08-25 ENCOUNTER — Inpatient Hospital Stay (HOSPITAL_COMMUNITY): Admission: RE | Admit: 2013-08-25 | Payer: BC Managed Care – PPO | Source: Ambulatory Visit

## 2013-08-26 ENCOUNTER — Other Ambulatory Visit (HOSPITAL_COMMUNITY): Payer: Self-pay | Admitting: Orthopedic Surgery

## 2013-08-26 NOTE — Progress Notes (Signed)
Chest xray 2 view 06-16-13 epic ekg 05-19-13 epic

## 2013-08-26 NOTE — Patient Instructions (Addendum)
[place]  08/26/2013   Your procedure is scheduled on: Monday July 13th, 2015  Report to [facility] Main Entrance and follow signs to  [place] at 730 AM.  Call this number if you have problems the morning of surgery [phone]   Remember:  Do not eat food or drink liquids :After Midnight.     Take these medicines the morning of surgery with A SIP OF WATER: oxycodone if needed, flexeril, metorprol                               You may not have any metal on your body including hair pins and piercings  Do not wear jewelry, make-up, lotions, powders, or deodorant.   Men may shave face and neck.  Do not bring valuables to the hospital. [place].  Contacts, dentures or bridgework may not be worn into surgery.  Leave suitcase in the car. After surgery it may be brought to your room.  For patients admitted to the hospital, checkout time is 11:00 AM the day of discharge.   Patients discharged the day of surgery will not be allowed to drive home.  Name and phone number of your driver:  Special Instructions: N/A ________________________________________________________________________  [facility] - Preparing for Surgery Before surgery, you can play an important role.  Because skin is not sterile, your skin needs to be as free of germs as possible.  You can reduce the number of germs on your skin by washing with CHG (chlorahexidine gluconate) soap before surgery.  CHG is an antiseptic cleaner which kills germs and bonds with the skin to continue killing germs even after washing. Please DO NOT use if you have an allergy to CHG or antibacterial soaps.  If your skin becomes reddened/irritated stop using the CHG and inform your nurse when you arrive at Short Stay. Do not shave (including legs and underarms) for at least 48 hours prior to the first CHG shower.  You may shave your face/neck. Please follow these instructions  carefully:  1.  Shower with CHG Soap the night before surgery and the  morning of Surgery.  2.  If you choose to wash your hair, wash your hair first as usual with your  normal  shampoo.  3.  After you shampoo, rinse your hair and body thoroughly to remove the  shampoo.                           4.  Use CHG as you would any other liquid soap.  You can apply chg directly  to the skin and wash                       Gently with a scrungie or clean washcloth.  5.  Apply the CHG Soap to your body ONLY FROM THE NECK DOWN.   Do not use on face/ open                           Wound or open sores. Avoid contact with eyes, ears mouth and genitals (private parts).                       Wash face,  Genitals (private parts) with your normal soap.  6.  Wash thoroughly, paying special attention to the area where your surgery  will be performed.  7.  Thoroughly rinse your body with warm water from the neck down.  8.  DO NOT shower/wash with your normal soap after using and rinsing off  the CHG Soap.                9.  Pat yourself dry with a clean towel.            10.  Wear clean pajamas.            11.  Place clean sheets on your bed the night of your first shower and do not  sleep with pets. Day of Surgery : Do not apply any lotions/deodorants the morning of surgery.  Please wear clean clothes to the hospital/surgery center.  FAILURE TO FOLLOW THESE INSTRUCTIONS MAY RESULT IN THE CANCELLATION OF YOUR SURGERY PATIENT SIGNATURE_________________________________  NURSE SIGNATURE__________________________________  ________________________________________________________________________   [name]  An incentive spirometer is a tool that can help keep your lungs clear and active. This tool measures how well you are filling your lungs with each breath. Taking long deep breaths may help reverse or decrease the chance of developing breathing (pulmonary) problems (especially infection)  following:  A long period of time when you are unable to move or be active. BEFORE THE PROCEDURE   If the spirometer includes an indicator to show your best effort, your nurse or respiratory therapist will set it to a desired goal.  If possible, sit up straight or lean slightly forward. Try not to slouch.  Hold the incentive spirometer in an upright position. INSTRUCTIONS FOR USE  1. Sit on the edge of your bed if possible, or sit up as far as you can in bed or on a chair. 2. Hold the incentive spirometer in an upright position. 3. Breathe out normally. 4. Place the mouthpiece in your mouth and seal your lips tightly around it. 5. Breathe in slowly and as deeply as possible, raising the piston or the ball toward the top of the column. 6. Hold your breath for 3-5 seconds or for as long as possible. Allow the piston or ball to fall to the bottom of the column. 7. Remove the mouthpiece from your mouth and breathe out normally. 8. Rest for a few seconds and repeat Steps 1 through 7 at least 10 times every 1-2 hours when you are awake. Take your time and take a few normal breaths between deep breaths. 9. The spirometer may include an indicator to show your best effort. Use the indicator as a goal to work toward during each repetition. 10. After each set of 10 deep breaths, practice coughing to be sure your lungs are clear. If you have an incision (the cut made at the time of surgery), support your incision when coughing by placing a pillow or rolled up towels firmly against it. Once you are able to get out of bed, walk around indoors and cough well. You may stop using the incentive spirometer when instructed by your caregiver.  RISKS AND COMPLICATIONS  Take your time so you do not get dizzy or light-headed.  If you are in pain, you may need to take or ask for pain medication before doing incentive spirometry. It is harder to take a deep breath if you are having pain. AFTER USE  Rest and  breathe slowly and easily.  It can be helpful to keep track of a log of  your progress. Your caregiver can provide you with a simple table to help with this. If you are using the spirometer at home, follow these instructions: [place] IF:   You are having difficultly using the spirometer.  You have trouble using the spirometer as often as instructed.  Your pain medication is not giving enough relief while using the spirometer.  You develop fever of 100.5 F (38.1 C) or higher. SEEK IMMEDIATE MEDICAL CARE IF:   You cough up bloody sputum that had not been present before.  You develop fever of 102 F (38.9 C) or greater.  You develop worsening pain at or near the incision site. MAKE SURE YOU:   Understand these instructions.  Will watch your condition.  Will get help right away if you are not doing well or get worse. Document Released: 06/18/2006 Document Revised: 04/30/2011 Document Reviewed: 08/19/2006 Exit[employer] Patient Information 2014 [employer], [place].   ________________________________________________________________________  WHAT IS A BLOOD TRANSFUSION? Blood Transfusion Information  A transfusion is the replacement of blood or some of its parts. Blood is made up of multiple cells which provide different functions.  Red blood cells carry oxygen and are used for blood loss replacement.  White blood cells fight against infection.  Platelets control bleeding.  Plasma helps clot blood.  Other blood products are available for specialized needs, such as hemophilia or other clotting disorders. BEFORE THE TRANSFUSION  Who gives blood for transfusions?   Healthy volunteers who are fully evaluated to make sure their blood is safe. This is blood bank blood. Transfusion therapy is the safest it has ever been in the practice of medicine. Before blood is taken from a donor, a complete history is taken to make sure that person has no history of diseases nor engages in  risky social behavior (examples are intravenous drug use or sexual activity with multiple partners). The donor's travel history is screened to minimize risk of transmitting infections, such as malaria. The donated blood is tested for signs of infectious diseases, such as HIV and hepatitis. The blood is then tested to be sure it is compatible with you in order to minimize the chance of a transfusion reaction. If you or a relative donates blood, this is often done in anticipation of surgery and is not appropriate for emergency situations. It takes many days to process the donated blood. RISKS AND COMPLICATIONS Although transfusion therapy is very safe and saves many lives, the main dangers of transfusion include:   Getting an infectious disease.  Developing a transfusion reaction. This is an allergic reaction to something in the blood you were given. Every precaution is taken to prevent this. The decision to have a blood transfusion has been considered carefully by your caregiver before blood is given. Blood is not given unless the benefits outweigh the risks. AFTER THE TRANSFUSION  Right after receiving a blood transfusion, you will usually feel much better and more energetic. This is especially true if your red blood cells have gotten low (anemic). The transfusion raises the level of the red blood cells which carry oxygen, and this usually causes an energy increase.  The nurse administering the transfusion will monitor you carefully for complications. HOME CARE INSTRUCTIONS  No special instructions are needed after a transfusion. You may find your energy is better. Speak with your caregiver about any limitations on activity for underlying diseases you may have. SEEK MEDICAL CARE IF:   Your condition is not improving after your transfusion.  You develop redness or  irritation at the intravenous (IV) site. SEEK IMMEDIATE MEDICAL CARE IF:  Any of the following symptoms occur over the next 12  hours:  Shaking chills.  You have a temperature by mouth above 102 F (38.9 C), not controlled by medicine.  Chest, back, or muscle pain.  People around you feel you are not acting correctly or are confused.  Shortness of breath or difficulty breathing.  Dizziness and fainting.  You get a rash or develop hives.  You have a decrease in urine output.  Your urine turns a dark color or changes to pink, red, or brown. Any of the following symptoms occur over the next 10 days:  You have a temperature by mouth above 102 F (38.9 C), not controlled by medicine.  Shortness of breath.  Weakness after normal activity.  The white part of the eye turns yellow (jaundice).  You have a decrease in the amount of urine or are urinating less often.  Your urine turns a dark color or changes to pink, red, or brown. Document Released: 02/03/2000 Document Revised: 04/30/2011 Document Reviewed: 09/22/2007 [place] Patient Information 2014 [place], [place].  _______________________________________________________________________

## 2013-08-28 ENCOUNTER — Encounter (HOSPITAL_COMMUNITY): Payer: Self-pay

## 2013-08-28 ENCOUNTER — Encounter (HOSPITAL_COMMUNITY)
Admission: RE | Admit: 2013-08-28 | Discharge: 2013-08-28 | Disposition: A | Discharge status: Home / Self Care | Payer: BC Managed Care – PPO | Source: Ambulatory Visit | Attending: Orthopedic Surgery | Admitting: Orthopedic Surgery

## 2013-08-28 LAB — CBC
HCT: 40.3 % (ref 36.0–46.0)
HEMOGLOBIN: 13.4 g/dL (ref 12.0–15.0)
MCH: 29.1 pg (ref 26.0–34.0)
MCHC: 33.3 g/dL (ref 30.0–36.0)
MCV: 87.4 fL (ref 78.0–100.0)
PLATELETS: 242 10*3/uL (ref 150–400)
RBC: 4.61 MIL/uL (ref 3.87–5.11)
RDW: 13 % (ref 11.5–15.5)
WBC: 4.1 10*3/uL (ref 4.0–10.5)

## 2013-08-28 LAB — BASIC METABOLIC PANEL
Anion gap: 11 (ref 5–15)
BUN: 14 mg/dL (ref 6–23)
CHLORIDE: 104 meq/L (ref 96–112)
CO2: 26 mEq/L (ref 19–32)
CREATININE: 0.71 mg/dL (ref 0.50–1.10)
Calcium: 9.8 mg/dL (ref 8.4–10.5)
GFR, EST NON AFRICAN AMERICAN: 87 mL/min — AB (ref >90)
Glucose, Bld: 80 mg/dL (ref 70–99)
Potassium: 4 mEq/L (ref 3.7–5.3)
Sodium: 141 mEq/L (ref 137–147)

## 2013-08-28 LAB — URINALYSIS, ROUTINE W REFLEX MICROSCOPIC
Bilirubin Urine: NEGATIVE
Glucose, UA: NEGATIVE mg/dL
HGB URINE DIPSTICK: NEGATIVE
KETONES UR: NEGATIVE mg/dL
Leukocytes, UA: NEGATIVE
Nitrite: NEGATIVE
Protein, ur: NEGATIVE mg/dL
Specific Gravity, Urine: 1.013 (ref 1.005–1.030)
Urobilinogen, UA: 0.2 mg/dL (ref 0.0–1.0)
pH: 6 (ref 5.0–8.0)

## 2013-08-28 LAB — SURGICAL PCR SCREEN
MRSA, PCR: NEGATIVE
STAPHYLOCOCCUS AUREUS: NEGATIVE

## 2013-08-28 LAB — APTT: APTT: 34 s (ref 24–37)

## 2013-08-28 LAB — PROTIME-INR
INR: 0.96 (ref 0.00–1.49)
PROTHROMBIN TIME: 12.8 s (ref 11.6–15.2)

## 2013-08-30 NOTE — H&P (Signed)
TOTAL KNEE ADMISSION H&P  Patient is being admitted for left medial unicompartmental knee arthroplasty.  Subjective:  Chief Complaint:   Left knee medial compartment OA / pain  HPI: Alice Smith, 68 y.o. female, has a history of pain and functional disability in the left knee due to arthritis and has failed non-surgical conservative treatments for greater than 12 weeks to include NSAID's and/or analgesics, corticosteriod injections, use of assistive devices and activity modification.  Onset of symptoms was gradual, starting years ago with gradually worsening course since that time. The patient noted no past surgery on the left knee(s).  Patient currently rates pain in the left knee(s) at 8 out of 10 with activity. Patient has night pain, worsening of pain with activity and weight bearing, pain that interferes with activities of daily living, pain with passive range of motion, crepitus and joint swelling.  Patient has evidence of periarticular osteophytes and joint space narrowing of the medial compartment by imaging studies.  There is no active infection.  Risks, benefits and expectations were discussed with the patient.  Risks including but not limited to the risk of anesthesia, blood clots, nerve damage, blood vessel damage, failure of the prosthesis, infection and up to and including death.  Patient understand the risks, benefits and expectations and wishes to proceed with surgery.   PCP: Elsie Stain, MD  D/C Plans:      Home with HHPT/SNF  Post-op Meds:       No Rx given   Tranexamic Acid:      To be given - IV  Decadron:      Is to be given  FYI:     ASA post-op   Dilaudid post-op     Patient Active Problem List   Diagnosis Date Noted  . Obese 06/24/2013  . Expected blood loss anemia 06/24/2013  . DJD (degenerative joint disease) of knee 06/23/2013  . Knee osteoarthritis 05/19/2013  . Edema 12/31/2011  . Rash 12/31/2011  . Medicare annual wellness visit, initial 10/14/2011   . PALPITATIONS 03/11/2009  . CHEST DISCOMFORT 03/11/2009  . Cough 02/21/2009  . NAUSEA 02/21/2009  . HERPETIC GINGIVOSTOMATITIS 06/30/2008  . HEMORRHOIDS 01/07/2008  . DIVERTICULOSIS, MILD 01/07/2008  . HYPERLIPIDEMIA 01/02/2008  . GERD 01/02/2008  . FECAL OCCULT BLOOD 12/22/2007  . DERMATOPHYTOSIS OF NAIL 12/18/2007  . HEMOCCULT POSITIVE STOOL 12/18/2007  . OSTEOPENIA 01/28/2007  . COMMON MIGRAINE 12/24/2006  . ANXIETY STATE NOS 11/08/2006  . INSOMNIA, CHRONIC 11/08/2006  . ALLERGIC  RHINITIS 07/17/2006  . KNEE PAIN, RIGHT 07/17/2006   Past Medical History  Diagnosis Date  . Migraine     evaluated with Dr. Domingo Cocking 11/2009  . Breast mass 05/2001    benign  . Dry cough     at night  . Arthritis   . Osteopenia   . Hemorrhoids   . Complication of anesthesia     slow to wake up x 1    Past Surgical History  Procedure Laterality Date  . Partial hysterectomy  1989    dyspasia, ovaries left in  . Rotator cuff repair      right 03/2001, left 07/2001  . Carpal tunnel release      right 03/2001, left 07/2001  . Esophagogastroduodenoscopy    . Breast surgery  1989    aspirated lump left breast  . Abdominal exploration surgery      for twisted bowel  . Knee arthroscopy      rt knee  . Nissenfundiplication    .  Partial knee arthroplasty Right 06/23/2013    Procedure: RIGHT KNEE MEDIAL UNICOMPARTMENTAL ARTHROPLASTY;  Surgeon: Mauri Pole, MD;  Location: [facility] ORS;  Service: Orthopedics;  Laterality: Right;    No prescriptions prior to admission   Allergies  Allergen Reactions  . Celecoxib     REACTION: rash  . Clarithromycin     REACTION: nausea and vomiting  . Erythromycin Ethylsuccinate     REACTION: GI upset  . Fluticasone-Salmeterol     REACTION: Sore tongue and rash  . Procaine Hcl     REACTION: swelling  . Sulfonamide Derivatives     REACTION: rash  . Topiramate     REACTION: confusion    History  Substance Use Topics  . Smoking status: Former Smoker --  1.00 packs/day for 5 years    Types: Cigarettes    Quit date: 06/17/1978  . Smokeless tobacco: Never Used     Comment: 5 pack year history  . Alcohol Use: No    Family History  Problem Relation Age of Onset  . Arthritis Mother   . Cancer Father     throat  . Cancer Sister     breast  . Breast cancer Sister   . Colon cancer Neg Hx   . COPD Brother      Review of Systems  Constitutional: Negative.   Eyes: Negative.   Respiratory: Negative.   Cardiovascular: Negative.   Gastrointestinal: Positive for heartburn.  Genitourinary: Negative.   Musculoskeletal: Positive for joint pain.  Skin: Positive for rash.  Neurological: Positive for headaches.  Endo/Heme/Allergies: Positive for environmental allergies.  Psychiatric/Behavioral: The patient is nervous/anxious and has insomnia.     Objective:  Physical Exam  Constitutional: She is oriented to person, place, and time. She appears well-developed and well-nourished.  HENT:  Head: Normocephalic and atraumatic.  Mouth/Throat: Oropharynx is clear and moist.  Eyes: Pupils are equal, round, and reactive to light.  Neck: Neck supple. No JVD present. No tracheal deviation present. No thyromegaly present.  Cardiovascular: Normal rate, regular rhythm, normal heart sounds and intact distal pulses.   Respiratory: Effort normal and breath sounds normal. No respiratory distress. She has no wheezes.  GI: Soft. There is no tenderness. There is no guarding.  Musculoskeletal:       Left knee: She exhibits decreased range of motion, swelling and bony tenderness. She exhibits no ecchymosis, no deformity, no laceration and no erythema. Tenderness found. Medial joint line tenderness noted. No lateral joint line tenderness noted.  Lymphadenopathy:    She has no cervical adenopathy.  Neurological: She is alert and oriented to person, place, and time.  Skin: Skin is warm and dry.  Psychiatric: She has a normal mood and affect.      Labs:  Estimated body mass index is 30.90 kg/(m^2) as calculated from the following:   Height as of 06/23/13: 5\' 2"  (1.575 m).   Weight as of 06/16/13: 76.658 kg (169 lb).   Imaging Review Plain radiographs demonstrate moderate degenerative joint disease of the left knee(s). The overall alignment isneutral. The bone quality appears to be good for age and reported activity level.  Assessment/Plan:  Left knee medial compartment OA / pain   The patient history, physical examination, clinical judgment of the provider and imaging studies are consistent with end stage degenerative joint disease of the left knee(s) and medial unicompartmental knee arthroplasty is deemed medically necessary. The treatment options including medical management, injection therapy arthroscopy and arthroplasty were discussed at length. The risks  and benefits of total knee arthroplasty were presented and reviewed. The risks due to aseptic loosening, infection, stiffness, patella tracking problems, thromboembolic complications and other imponderables were discussed. The patient acknowledged the explanation, agreed to proceed with the plan and consent was signed. Patient is being admitted for inpatient treatment for surgery, pain control, PT, OT, prophylactic antibiotics, VTE prophylaxis, progressive ambulation and ADL's and discharge planning. The patient is planning to be discharged home with home health services.     [place] Pugh Bianney Rockwood   PA-C  08/30/2013, 10:05 AM

## 2013-08-31 ENCOUNTER — Encounter (HOSPITAL_COMMUNITY): Payer: BC Managed Care – PPO | Admitting: Anesthesiology

## 2013-08-31 ENCOUNTER — Encounter (HOSPITAL_COMMUNITY)
Admission: RE | Disposition: A | Discharge status: Home / Self Care | Payer: Self-pay | Source: Ambulatory Visit | Attending: Orthopedic Surgery

## 2013-08-31 ENCOUNTER — Observation Stay (HOSPITAL_COMMUNITY)
Admission: RE | Admit: 2013-08-31 | Discharge: 2013-09-01 | Disposition: A | Discharge status: Home / Self Care | Payer: BC Managed Care – PPO | Source: Ambulatory Visit | Attending: Orthopedic Surgery | Admitting: Orthopedic Surgery

## 2013-08-31 ENCOUNTER — Encounter (HOSPITAL_COMMUNITY): Payer: Self-pay | Admitting: *Deleted

## 2013-08-31 ENCOUNTER — Ambulatory Visit (HOSPITAL_COMMUNITY): Payer: BC Managed Care – PPO | Admitting: Anesthesiology

## 2013-08-31 DIAGNOSIS — Z96652 Presence of left artificial knee joint: Secondary | ICD-10-CM

## 2013-08-31 DIAGNOSIS — M1710 Unilateral primary osteoarthritis, unspecified knee: Principal | ICD-10-CM | POA: Insufficient documentation

## 2013-08-31 DIAGNOSIS — Z01812 Encounter for preprocedural laboratory examination: Secondary | ICD-10-CM | POA: Insufficient documentation

## 2013-08-31 DIAGNOSIS — K219 Gastro-esophageal reflux disease without esophagitis: Secondary | ICD-10-CM | POA: Insufficient documentation

## 2013-08-31 DIAGNOSIS — Z87891 Personal history of nicotine dependence: Secondary | ICD-10-CM | POA: Insufficient documentation

## 2013-08-31 HISTORY — PX: PARTIAL KNEE ARTHROPLASTY: SHX2174

## 2013-08-31 LAB — TYPE AND SCREEN
ABO/RH(D): O POS
Antibody Screen: NEGATIVE

## 2013-08-31 SURGERY — ARTHROPLASTY, KNEE, UNICOMPARTMENTAL
Anesthesia: Spinal | Site: Knee | Laterality: Left

## 2013-08-31 MED ORDER — BUPIVACAINE-EPINEPHRINE (PF) 0.25% -1:200000 IJ SOLN
INTRAMUSCULAR | Status: AC
  Filled 2013-08-31: qty 30

## 2013-08-31 MED ORDER — CYCLOBENZAPRINE HCL 10 MG PO TABS: 10.0000 mg | ORAL_TABLET | Freq: Three times a day (TID) | ORAL | Status: DC | PRN

## 2013-08-31 MED ORDER — PROPOFOL INFUSION 10 MG/ML OPTIME
INTRAVENOUS | Status: DC | PRN
  Administered 2013-08-31: 25 ug/kg/min via INTRAVENOUS

## 2013-08-31 MED ORDER — SODIUM CHLORIDE 0.9 % IJ SOLN
INTRAMUSCULAR | Status: AC
  Filled 2013-08-31: qty 10

## 2013-08-31 MED ORDER — LACTATED RINGERS IV SOLN
INTRAVENOUS | Status: DC
  Administered 2013-08-31: 10:00:00 via INTRAVENOUS
  Administered 2013-08-31: 1000 mL via INTRAVENOUS

## 2013-08-31 MED ORDER — ONDANSETRON HCL 4 MG/2ML IJ SOLN
INTRAMUSCULAR | Status: DC | PRN
  Administered 2013-08-31 (×2): 4 mg via INTRAVENOUS

## 2013-08-31 MED ORDER — LIDOCAINE HCL (CARDIAC) 20 MG/ML IV SOLN
INTRAVENOUS | Status: AC
  Filled 2013-08-31: qty 5

## 2013-08-31 MED ORDER — DOCUSATE SODIUM 100 MG PO CAPS
100.0000 mg | ORAL_CAPSULE | Freq: Two times a day (BID) | ORAL | Status: DC
  Administered 2013-08-31 – 2013-09-01 (×2): 100 mg via ORAL

## 2013-08-31 MED ORDER — MENTHOL 3 MG MT LOZG
1.0000 | LOZENGE | OROMUCOSAL | Status: DC | PRN
  Filled 2013-08-31: qty 9

## 2013-08-31 MED ORDER — KETOROLAC TROMETHAMINE 30 MG/ML IJ SOLN
INTRAMUSCULAR | Status: AC
  Filled 2013-08-31: qty 1

## 2013-08-31 MED ORDER — METOCLOPRAMIDE HCL 10 MG PO TABS: 5.0000 mg | ORAL_TABLET | Freq: Three times a day (TID) | ORAL | Status: DC | PRN

## 2013-08-31 MED ORDER — METOPROLOL SUCCINATE ER 50 MG PO TB24
50.0000 mg | ORAL_TABLET | Freq: Every morning | ORAL | Status: DC
  Administered 2013-09-01: 50 mg via ORAL
  Filled 2013-08-31: qty 1

## 2013-08-31 MED ORDER — DEXAMETHASONE SODIUM PHOSPHATE 10 MG/ML IJ SOLN
10.0000 mg | Freq: Once | INTRAMUSCULAR | Status: AC
  Administered 2013-09-01: 10 mg via INTRAVENOUS
  Filled 2013-08-31: qty 1

## 2013-08-31 MED ORDER — TRANEXAMIC ACID 100 MG/ML IV SOLN
1000.0000 mg | Freq: Once | INTRAVENOUS | Status: AC
  Administered 2013-08-31: 1000 mg via INTRAVENOUS
  Filled 2013-08-31: qty 10

## 2013-08-31 MED ORDER — CEFAZOLIN SODIUM-DEXTROSE 2-3 GM-% IV SOLR
INTRAVENOUS | Status: AC
  Filled 2013-08-31: qty 50

## 2013-08-31 MED ORDER — MIDAZOLAM HCL 2 MG/2ML IJ SOLN
INTRAMUSCULAR | Status: AC
  Filled 2013-08-31: qty 2

## 2013-08-31 MED ORDER — PROMETHAZINE HCL 25 MG PO TABS: 25.0000 mg | ORAL_TABLET | Freq: Three times a day (TID) | ORAL | Status: DC | PRN

## 2013-08-31 MED ORDER — POLYETHYLENE GLYCOL 3350 17 G PO PACK
17.0000 g | PACK | Freq: Two times a day (BID) | ORAL | Status: DC
  Administered 2013-08-31: 17 g via ORAL

## 2013-08-31 MED ORDER — FENTANYL CITRATE 0.05 MG/ML IJ SOLN
INTRAMUSCULAR | Status: AC
  Filled 2013-08-31: qty 2

## 2013-08-31 MED ORDER — BISACODYL 10 MG RE SUPP: 10.0000 mg | Freq: Every day | RECTAL | Status: DC | PRN

## 2013-08-31 MED ORDER — MAGNESIUM CITRATE PO SOLN: 1.0000 | Freq: Once | ORAL | Status: AC | PRN

## 2013-08-31 MED ORDER — METHOCARBAMOL 1000 MG/10ML IJ SOLN
500.0000 mg | Freq: Four times a day (QID) | INTRAVENOUS | Status: DC | PRN
  Filled 2013-08-31: qty 5

## 2013-08-31 MED ORDER — RALOXIFENE HCL 60 MG PO TABS
60.0000 mg | ORAL_TABLET | Freq: Every morning | ORAL | Status: DC
  Administered 2013-09-01: 60 mg via ORAL
  Filled 2013-08-31: qty 1

## 2013-08-31 MED ORDER — SODIUM CHLORIDE 0.9 % IJ SOLN
INTRAMUSCULAR | Status: DC | PRN
  Administered 2013-08-31: 9 mL

## 2013-08-31 MED ORDER — DEXAMETHASONE SODIUM PHOSPHATE 10 MG/ML IJ SOLN
10.0000 mg | Freq: Once | INTRAMUSCULAR | Status: AC
  Administered 2013-08-31: 10 mg via INTRAVENOUS

## 2013-08-31 MED ORDER — PROPOFOL 10 MG/ML IV BOLUS
INTRAVENOUS | Status: DC | PRN
  Administered 2013-08-31 (×2): 10 mg via INTRAVENOUS

## 2013-08-31 MED ORDER — BUPIVACAINE-EPINEPHRINE 0.25% -1:200000 IJ SOLN
INTRAMUSCULAR | Status: DC | PRN
  Administered 2013-08-31: 30 mL

## 2013-08-31 MED ORDER — HYDROMORPHONE HCL PF 1 MG/ML IJ SOLN: 0.5000 mg | INTRAMUSCULAR | Status: DC | PRN

## 2013-08-31 MED ORDER — FERROUS SULFATE 325 (65 FE) MG PO TABS
325.0000 mg | ORAL_TABLET | Freq: Three times a day (TID) | ORAL | Status: DC
  Administered 2013-08-31 – 2013-09-01 (×2): 325 mg via ORAL
  Filled 2013-08-31 (×5): qty 1

## 2013-08-31 MED ORDER — ONDANSETRON HCL 4 MG/2ML IJ SOLN
INTRAMUSCULAR | Status: AC
  Filled 2013-08-31: qty 2

## 2013-08-31 MED ORDER — LIDOCAINE HCL (CARDIAC) 20 MG/ML IV SOLN
INTRAVENOUS | Status: DC | PRN
  Administered 2013-08-31 (×2): 10 mg via INTRAVENOUS

## 2013-08-31 MED ORDER — DIPHENHYDRAMINE HCL 25 MG PO CAPS: 25.0000 mg | ORAL_CAPSULE | Freq: Four times a day (QID) | ORAL | Status: DC | PRN

## 2013-08-31 MED ORDER — FENTANYL CITRATE 0.05 MG/ML IJ SOLN
50.0000 ug | INTRAMUSCULAR | Status: DC | PRN
  Administered 2013-08-31: 100 ug via INTRAVENOUS

## 2013-08-31 MED ORDER — METHOCARBAMOL 500 MG PO TABS
500.0000 mg | ORAL_TABLET | Freq: Four times a day (QID) | ORAL | Status: DC | PRN
  Administered 2013-08-31 – 2013-09-01 (×2): 500 mg via ORAL
  Filled 2013-08-31 (×2): qty 1

## 2013-08-31 MED ORDER — OXYCODONE HCL 5 MG PO TABS
5.0000 mg | ORAL_TABLET | ORAL | Status: DC
  Administered 2013-08-31 (×2): 5 mg via ORAL
  Administered 2013-09-01 (×4): 10 mg via ORAL
  Filled 2013-08-31: qty 2
  Filled 2013-08-31: qty 1
  Filled 2013-08-31 (×2): qty 2
  Filled 2013-08-31: qty 1
  Filled 2013-08-31: qty 2

## 2013-08-31 MED ORDER — LACTATED RINGERS IV SOLN
INTRAVENOUS | Status: DC
  Administered 2013-08-31: 1 mL via INTRAVENOUS

## 2013-08-31 MED ORDER — FENTANYL CITRATE 0.05 MG/ML IJ SOLN
INTRAMUSCULAR | Status: DC | PRN
  Administered 2013-08-31: 50 ug via INTRAVENOUS
  Administered 2013-08-31 (×2): 25 ug via INTRAVENOUS

## 2013-08-31 MED ORDER — BUPIVACAINE LIPOSOME 1.3 % IJ SUSP
20.0000 mL | Freq: Once | INTRAMUSCULAR | Status: AC
  Administered 2013-08-31: 20 mL
  Filled 2013-08-31: qty 20

## 2013-08-31 MED ORDER — METOCLOPRAMIDE HCL 5 MG/ML IJ SOLN: 5.0000 mg | Freq: Three times a day (TID) | INTRAMUSCULAR | Status: DC | PRN

## 2013-08-31 MED ORDER — ONDANSETRON HCL 4 MG/2ML IJ SOLN: 4.0000 mg | Freq: Four times a day (QID) | INTRAMUSCULAR | Status: DC | PRN

## 2013-08-31 MED ORDER — SODIUM CHLORIDE 0.9 % IV SOLN
INTRAVENOUS | Status: DC
  Administered 2013-08-31 – 2013-09-01 (×2): via INTRAVENOUS
  Filled 2013-08-31 (×5): qty 1000

## 2013-08-31 MED ORDER — CEFAZOLIN SODIUM-DEXTROSE 2-3 GM-% IV SOLR
2.0000 g | Freq: Four times a day (QID) | INTRAVENOUS | Status: AC
  Administered 2013-08-31 (×2): 2 g via INTRAVENOUS
  Filled 2013-08-31 (×2): qty 50

## 2013-08-31 MED ORDER — ONDANSETRON HCL 4 MG PO TABS: 4.0000 mg | ORAL_TABLET | Freq: Four times a day (QID) | ORAL | Status: DC | PRN

## 2013-08-31 MED ORDER — 0.9 % SODIUM CHLORIDE (POUR BTL) OPTIME
TOPICAL | Status: DC | PRN
  Administered 2013-08-31: 1000 mL

## 2013-08-31 MED ORDER — PROPOFOL 10 MG/ML IV BOLUS
INTRAVENOUS | Status: AC
  Filled 2013-08-31: qty 80

## 2013-08-31 MED ORDER — ALUM & MAG HYDROXIDE-SIMETH 200-200-20 MG/5ML PO SUSP: 30.0000 mL | ORAL | Status: DC | PRN

## 2013-08-31 MED ORDER — HYDROMORPHONE HCL PF 1 MG/ML IJ SOLN: 0.2500 mg | INTRAMUSCULAR | Status: DC | PRN

## 2013-08-31 MED ORDER — HYDROCOD POLST-CHLORPHEN POLST 10-8 MG/5ML PO LQCR: 5.0000 mL | Freq: Every evening | ORAL | Status: DC | PRN

## 2013-08-31 MED ORDER — KETOROLAC TROMETHAMINE 30 MG/ML IJ SOLN
INTRAMUSCULAR | Status: DC | PRN
  Administered 2013-08-31: 30 mg

## 2013-08-31 MED ORDER — ASPIRIN EC 325 MG PO TBEC
325.0000 mg | DELAYED_RELEASE_TABLET | Freq: Two times a day (BID) | ORAL | Status: DC
  Administered 2013-09-01: 325 mg via ORAL
  Filled 2013-08-31 (×3): qty 1

## 2013-08-31 MED ORDER — CEFAZOLIN SODIUM-DEXTROSE 2-3 GM-% IV SOLR
2.0000 g | INTRAVENOUS | Status: AC
  Administered 2013-08-31: 2 g via INTRAVENOUS

## 2013-08-31 MED ORDER — PHENOL 1.4 % MT LIQD
1.0000 | OROMUCOSAL | Status: DC | PRN
  Filled 2013-08-31: qty 177

## 2013-08-31 MED ORDER — MIDAZOLAM HCL 5 MG/5ML IJ SOLN
INTRAMUSCULAR | Status: DC | PRN
  Administered 2013-08-31 (×3): 1 mg via INTRAVENOUS

## 2013-08-31 SURGICAL SUPPLY — 58 items
ADH SKN CLS APL DERMABOND .7 (GAUZE/BANDAGES/DRESSINGS) ×1
BAG SPEC THK2 15X12 ZIP CLS (MISCELLANEOUS)
BAG ZIPLOCK 12X15 (MISCELLANEOUS) IMPLANT
BANDAGE ELASTIC 6 VELCRO ST LF (GAUZE/BANDAGES/DRESSINGS) ×3 IMPLANT
BANDAGE ESMARK 6X9 LF (GAUZE/BANDAGES/DRESSINGS) ×1 IMPLANT
BIT DRILL QUICK REL 1/8 2PK SL (DRILL) IMPLANT
BLADE SAW RECIPROCATING 77.5 (BLADE) ×3 IMPLANT
BLADE SAW SGTL 11.0X1.19X90.0M (BLADE) ×2 IMPLANT
BLADE SAW SGTL 13.0X1.19X90.0M (BLADE) ×3 IMPLANT
BNDG CMPR 9X6 STRL LF SNTH (GAUZE/BANDAGES/DRESSINGS) ×1
BNDG ESMARK 6X9 LF (GAUZE/BANDAGES/DRESSINGS) ×3
BOWL SMART MIX CTS (DISPOSABLE) ×3 IMPLANT
CAPT KNEE OXFORD ×2 IMPLANT
CEMENT HV SMART SET (Cement) ×3 IMPLANT
CUFF TOURN SGL QUICK 34 (TOURNIQUET CUFF) ×3
CUFF TRNQT CYL 34X4X40X1 (TOURNIQUET CUFF) ×1 IMPLANT
DERMABOND ADVANCED (GAUZE/BANDAGES/DRESSINGS) ×2
DERMABOND ADVANCED .7 DNX12 (GAUZE/BANDAGES/DRESSINGS) ×1 IMPLANT
DRAPE EXTREMITY T 121X128X90 (DRAPE) ×3 IMPLANT
DRAPE POUCH INSTRU U-SHP 10X18 (DRAPES) ×3 IMPLANT
DRAPE U-SHAPE 47X51 STRL (DRAPES) ×3 IMPLANT
DRILL QUICK RELEASE 1/8 INCH (DRILL) ×2
DRSG AQUACEL AG ADV 3.5X10 (GAUZE/BANDAGES/DRESSINGS) ×3 IMPLANT
DRSG TEGADERM 4X4.75 (GAUZE/BANDAGES/DRESSINGS) IMPLANT
DURAPREP 26ML APPLICATOR (WOUND CARE) ×6 IMPLANT
ELECT REM PT RETURN 9FT ADLT (ELECTROSURGICAL) ×3
ELECTRODE REM PT RTRN 9FT ADLT (ELECTROSURGICAL) ×1 IMPLANT
EVACUATOR 1/8 PVC DRAIN (DRAIN) IMPLANT
FACESHIELD WRAPAROUND (MASK) ×12 IMPLANT
FACESHIELD WRAPAROUND OR TEAM (MASK) ×4 IMPLANT
GAUZE SPONGE 2X2 8PLY STRL LF (GAUZE/BANDAGES/DRESSINGS) IMPLANT
GLOVE BIOGEL PI IND STRL 7.5 (GLOVE) ×1 IMPLANT
GLOVE BIOGEL PI IND STRL 8 (GLOVE) ×1 IMPLANT
GLOVE BIOGEL PI INDICATOR 7.5 (GLOVE) ×2
GLOVE BIOGEL PI INDICATOR 8 (GLOVE) ×2
GLOVE ECLIPSE 8.0 STRL XLNG CF (GLOVE) ×3 IMPLANT
GLOVE ORTHO TXT STRL SZ7.5 (GLOVE) ×6 IMPLANT
GOWN SPEC L3 XXLG W/TWL (GOWN DISPOSABLE) ×3 IMPLANT
GOWN STRL REUS W/TWL LRG LVL3 (GOWN DISPOSABLE) ×3 IMPLANT
KIT BASIN OR (CUSTOM PROCEDURE TRAY) ×3 IMPLANT
LEGGING LITHOTOMY PAIR STRL (DRAPES) ×3 IMPLANT
MANIFOLD NEPTUNE II (INSTRUMENTS) ×3 IMPLANT
NDL SAFETY ECLIPSE 18X1.5 (NEEDLE) ×1 IMPLANT
NEEDLE HYPO 18GX1.5 SHARP (NEEDLE) ×3
PACK TOTAL JOINT (CUSTOM PROCEDURE TRAY) ×3 IMPLANT
SPONGE GAUZE 2X2 STER 10/PKG (GAUZE/BANDAGES/DRESSINGS)
SUCTION FRAZIER TIP 10 FR DISP (SUCTIONS) ×3 IMPLANT
SUT MNCRL AB 4-0 PS2 18 (SUTURE) ×3 IMPLANT
SUT VIC AB 1 CT1 36 (SUTURE) ×3 IMPLANT
SUT VIC AB 2-0 CT1 27 (SUTURE) ×6
SUT VIC AB 2-0 CT1 TAPERPNT 27 (SUTURE) ×2 IMPLANT
SUT VLOC 180 0 24IN GS25 (SUTURE) ×3 IMPLANT
SYRINGE 60CC LL (MISCELLANEOUS) ×3 IMPLANT
TOWEL OR 17X26 10 PK STRL BLUE (TOWEL DISPOSABLE) ×3 IMPLANT
TOWEL OR NON WOVEN STRL DISP B (DISPOSABLE) IMPLANT
TRAY FOLEY CATH 14FRSI W/METER (CATHETERS) ×2 IMPLANT
WATER STERILE IRR 1500ML POUR (IV SOLUTION) ×2 IMPLANT
WRAP KNEE MAXI GEL POST OP (GAUZE/BANDAGES/DRESSINGS) ×2 IMPLANT

## 2013-08-31 NOTE — Transfer of Care (Signed)
Immediate Anesthesia Transfer of Care Note  Patient: Alice Smith  Procedure(s) Performed: Procedure(s): LEFT UNICOMPARTMENTAL KNEE ATHROPLASTY   (MEDIAL)  (Left)  Patient Location: PACU  Anesthesia Type:Spinal  Level of Consciousness: awake, alert  and oriented  Airway & Oxygen Therapy: Patient Spontanous Breathing and Patient connected to face mask oxygen  Post-op Assessment: Report given to PACU RN and Post -op Vital signs reviewed and stable  Post vital signs: Reviewed and stable  Complications: No apparent anesthesia complications

## 2013-08-31 NOTE — Evaluation (Signed)
Physical Therapy Evaluation Patient Details Name: Alice Smith MRN: [id number] DOB: [birth year] Today's Date: 08/31/2013   History of Present Illness  Pt with L UKR, and with recent RUKA 2 months ago.   Clinical Impression  Pt with L UKR doing ery well. To benefit from PT to return home at safe level of supervision with her family.     Follow Up Recommendations Home health PT    Equipment Recommendations  None recommended by PT    Recommendations for Other Services       Precautions / Restrictions Precautions Precautions: Knee Restrictions Weight Bearing Restrictions: No      Mobility  Bed Mobility Overal bed mobility: Needs Assistance Bed Mobility: Supine to Sit     Supine to sit: Min assist     General bed mobility comments: with LE   Transfers Overall transfer level: Needs assistance Equipment used: Rolling walker (2 wheeled) Transfers: Sit to/from Stand Sit to Stand: Supervision         General transfer comment: cues for LE and hand placement safety  Ambulation/Gait Ambulation/Gait assistance: Min guard Ambulation Distance (Feet): 40 Feet Assistive device: Rolling walker (2 wheeled) Gait Pattern/deviations: Step-to pattern        Stairs            Wheelchair Mobility    Modified Rankin (Stroke Patients Only)       Balance                                             Pertinent Vitals/Pain 2/10 L LE , very tolerable , I am doing better than last time.     Home Living Family/patient expects to be discharged to:: Private residence Living Arrangements: Spouse/significant other Available Help at Discharge: Family Type of Home: House Home Access: Stairs to enter Entrance Stairs-Rails: None Entrance Stairs-Number of Steps: 3 Home Layout: Multi-level;Able to live on main level with bedroom/bathroom Home Equipment: Gilford Rile - 2 wheels;Bedside commode;Cane - single point      Prior Function Level of Independence:  Independent         Comments: very active person     Hand Dominance        Extremity/Trunk Assessment               Lower Extremity Assessment: LLE deficits/detail   LLE Deficits / Details: general weakness on LLE with ROM gorssly 0-90 in supine and 4-/5 for quads (doing well after surgery)      Communication   Communication: No difficulties  Cognition Arousal/Alertness: Awake/alert Behavior During Therapy: WFL for tasks assessed/performed Overall Cognitive Status: Within Functional Limits for tasks assessed                      General Comments      Exercises Total Joint Exercises Ankle Circles/Pumps: AROM;Left;10 reps Quad Sets: AROM;Left;10 reps Heel Slides: AAROM;Left;10 reps Goniometric ROM: 0-90      Assessment/Plan    PT Assessment Patient needs continued PT services  PT Diagnosis Difficulty walking   PT Problem List Decreased strength;Decreased range of motion;Decreased activity tolerance;Decreased mobility  PT Treatment Interventions Gait training;Stair training;Functional mobility training;Therapeutic activities;Therapeutic exercise;DME instruction;Patient/family education   PT Goals (Current goals can be found in the Care Plan section) Acute Rehab PT Goals Patient Stated Goal: to return to home and be active again PT Goal Formulation: With  patient Time For Goal Achievement: 09/07/13 Potential to Achieve Goals: Good    Frequency 7X/week   Barriers to discharge        Co-evaluation               End of Session Equipment Utilized During Treatment: Gait belt Activity Tolerance: Patient tolerated treatment well Patient left: in chair;with call bell/phone within reach Nurse Communication: Mobility status         Time: 6837-2902 PT Time Calculation (min): 37 min   Charges:   PT Evaluation $Initial PT Evaluation Tier I: 1 Procedure PT Treatments $Gait Training: 8-22 mins $Therapeutic Exercise: 8-22 mins   PT G  CodesClide Dales 08/31/2013, 5:40 PM Clide Dales, PT Pager: [phone] 08/31/2013

## 2013-08-31 NOTE — Anesthesia Preprocedure Evaluation (Signed)
Anesthesia Evaluation  Patient identified by MRN, date of birth, ID band Patient awake    Reviewed: Allergy & Precautions, H&P , NPO status , Patient's Chart, lab work & pertinent test results, reviewed documented beta blocker date and time   Airway Mallampati: II TM Distance: >3 FB Neck ROM: full    Dental no notable dental hx. (+) Caps, Dental Advisory Given All upper front teeth are capped:   Pulmonary neg pulmonary ROS, former smoker,  breath sounds clear to auscultation  Pulmonary exam normal       Cardiovascular Exercise Tolerance: Good negative cardio ROS  Rhythm:regular Rate:Normal  palpitations   Neuro/Psych  Headaches, negative neurological ROS  negative psych ROS   GI/Hepatic negative GI ROS, Neg liver ROS, GERD-  ,History Nissen   Endo/Other  negative endocrine ROS  Renal/GU negative Renal ROS  negative genitourinary   Musculoskeletal   Abdominal   Peds  Hematology negative hematology ROS (+)   Anesthesia Other Findings   Reproductive/Obstetrics negative OB ROS                           Anesthesia Physical Anesthesia Plan  ASA: II  Anesthesia Plan: Spinal   Post-op Pain Management:    Induction:   Airway Management Planned: Simple Face Mask  Additional Equipment:   Intra-op Plan:   Post-operative Plan:   Informed Consent: I have reviewed the patients History and Physical, chart, labs and discussed the procedure including the risks, benefits and alternatives for the proposed anesthesia with the patient or authorized representative who has indicated his/her understanding and acceptance.   Dental Advisory Given  Plan Discussed with: CRNA and Surgeon  Anesthesia Plan Comments:         Anesthesia Quick Evaluation

## 2013-08-31 NOTE — Anesthesia Postprocedure Evaluation (Signed)
  Anesthesia Post-op Note  Patient: Alice Smith  Procedure(s) Performed: Procedure(s) (LRB): LEFT UNICOMPARTMENTAL KNEE ATHROPLASTY   (MEDIAL)  (Left)  Patient Location: PACU  Anesthesia Type: Spinal  Level of Consciousness: awake and alert   Airway and Oxygen Therapy: Patient Spontanous Breathing  Post-op Pain: mild  Post-op Assessment: Post-op Vital signs reviewed, Patient's Cardiovascular Status Stable, Respiratory Function Stable, Patent Airway and No signs of Nausea or vomiting  Last Vitals:  Filed Vitals:   08/31/13 1147  BP: 101/66  Pulse: 86  Temp: 36.4 C  Resp: 19    Post-op Vital Signs: stable   Complications: No apparent anesthesia complications

## 2013-08-31 NOTE — Op Note (Signed)
NAME: BABY GIEGER    MEDICAL RECORD NO.: [id number]   FACILITY: [facility] BIRTH: 1945/06/24  PHYSICIAN: Pietro Cassis. Alvan Dame, M.D.    DATE OF PROCEDURE: 08/31/2013    OPERATIVE REPORT   PREOPERATIVE DIAGNOSIS: Left knee medial compartment osteoarthritis.   POSTOPERATIVE DIAGNOSIS: Left knee medial compartment osteoarthritis.  PROCEDURE: Left partial knee replacement utilizing Biomet Oxford knee  component, size small femur, a left medial size AA tibial tray with a size 5 mm insert.   SURGEON: Pietro Cassis. Alvan Dame, M.D.   ASSISTANT: Danae Orleans, PAC.  Please note that Mr. Guinevere Scarlet was present for the entirety of the case,  utilized for preoperative positioning, perioperative retractor  management, general facilitation of the case and primary wound closure.   ANESTHESIA: Spinal.   SPECIMENS: None.   COMPLICATIONS: None.  DRAINS: None   TOURNIQUET TIME: 38 minutes at 250 mmHg.   INDICATIONS FOR PROCEDURE: The patient is a 68 yo female patient of mine who presented for evaluation of left knee pain.  They presented with primary complaints of pain on the medial side of their knee. Radiographs revealed advanced medial compartment arthritis with specifically an antero-medial wear pattern.  There was bone on bone changes noted with subchondral sclerosis and osteophytes present. The patient has had progressive problems failing to respond to conservative measures of medications, injections and activity modification. Risks of infection, DVT, component failure, need for future revision surgery were all discussed and reviewed.  Consent was obtained for benefit of pain relief.   PROCEDURE IN DETAIL: The patient was brought to the operative theater.  Once adequate anesthesia, preoperative antibiotics, 2gm of Ancef administered, the patient was positioned in supine position with a left thigh tourniquet  placed. The left lower extremity was prepped and draped in sterile  fashion with the leg  on the Oxford leg holder.  The leg was allowed to flex to 120 degrees. A time-out  was performed identifying the patient, planned procedure, and extremity.  The leg was exsanguinated, tourniquet elevated to 250 mmHg. A midline  incision was made from the proximal pole of the patella to the tibial tubercle. A  soft tissue plane was created and partial median arthrotomy was then  made to allow for subluxation of the patella. Following initial synovectomy and  debridement, the osteophytes were removed off the medial aspect of the  knee.   Attention was first directed to the tibia. The tibial  extramedullary guide was positioned over the anterior crest of the tibia  and pinned into position, and using a measured resection guide from the  [employer] system, a 4 mm resection was made off the proximal tibia. First  the reciprocating saw along the medial aspect of the tibial spines, then the oscillating saw.    At this point, I sized this cut surface seem to be best fit for a size AA tibial tray.  With the retractors out of the wound and the knee held at 90 degrees the 4 feeler gauge had appropriate tension on the medial ligament.   At this point, the femoral canal was opened with a drill and the  intramedullary rod passed. Then using the guide for a small posterior resection off  the posterior aspect of the femur was positioned over the mid portion of the medial femoral condyle.  The orientation was set using the guide that mates the femoral guide to the intramedullary rod.  The 2 drill holes were made into the distal femur.  The posterior  guide was then impacted into place and the posterior  femoral cut made.  At this point, I milled the distal femur with a size 4 spigot in place. At this point, we did a trial reduction of the small femur, size AA tibial tray and a size 4 then 5 feeler gauge. At 90 degrees of flexion and at 20 degrees of flexion the knee had symmetric tension on  the ligaments.  Given  these findings, the trial femoral component was removed. Final preparation of tibia was carried out by pinning it in position. Then  using a reciprocating saw I removed bone for the keel. Further bone was  removed with an osteotome.  Trial reduction was now carried out with the small femur, the left medial keeled tibia, and a size 5 lollipop insert. The balance of the  ligaments appeared to be symmetric at 20 degrees and 90 degrees. Given  all these findings, the trial components were removed.   Cement was mixed. The final components were opened. The knee was irrigated with  normal saline solution. Then final debridements of the  soft tissue was carried out, I also drilled the sclerotic bone with a drill.  The final components were cemented with a single batch of cement in a  two-stage technique with the tibial component cemented first. The knee  was then brought  to 45 degrees of flexion with a 5 feeler gauge, held with pressure for a minute and half.  After this the femoral component was cemented in place.  The knee was again held at 45 degrees of flexion while the cement fully cured.  Excess cement was removed throughout the knee. Tourniquet was let down  after 38 minutes. After the cement had fully cured and excessive cement  was removed throughout the knee there was no visualized cement present.   The final size 5 mm was chosen and snapped into position. We re-irrigated  the knee. The extensor mechanism was then reapproximated using a #1 Vicryl with the knee in flexion. The  remaining wound was closed with 2-0 Vicryl and a running 4-0 Monocryl.  The knee was cleaned, dried, and dressed sterilely using Dermabond and  Aquacel dressing. The patient was brought to the recovery room, Ace wrap in place, tolerating the  procedure well. He will be in the hospital for overnight observation.  We will initiate physical therapy and progress to ambulate.     Pietro Cassis Alvan Dame, M.D.

## 2013-08-31 NOTE — Interval H&P Note (Signed)
History and Physical Interval Note:  08/31/2013 8:51 AM  Alice Smith  has presented today for surgery, with the diagnosis of LEFT MEDIAL COMPARTMENTAL OA   The various methods of treatment have been discussed with the patient and family. After consideration of risks, benefits and other options for treatment, the patient has consented to  Procedure(s): LEFT UNICOMPARTMENTAL KNEE ATHROPLASTY   (MEDIAL)  (Left) as a surgical intervention .  The patient's history has been reviewed, patient examined, no change in status, stable for surgery.  I have reviewed the patient's chart and labs.  Questions were answered to the patient's satisfaction.     Mauri Pole

## 2013-09-01 LAB — BASIC METABOLIC PANEL
ANION GAP: 12 (ref 5–15)
BUN: 9 mg/dL (ref 6–23)
CALCIUM: 8.8 mg/dL (ref 8.4–10.5)
CO2: 21 mEq/L (ref 19–32)
Chloride: 107 mEq/L (ref 96–112)
Creatinine, Ser: 0.64 mg/dL (ref 0.50–1.10)
GFR calc Af Amer: >90 mL/min (ref >90)
GFR, EST NON AFRICAN AMERICAN: 90 mL/min — AB (ref >90)
Glucose, Bld: 118 mg/dL — ABNORMAL HIGH (ref 70–99)
Potassium: 4.3 mEq/L (ref 3.7–5.3)
Sodium: 140 mEq/L (ref 137–147)

## 2013-09-01 LAB — CBC
HCT: 33.3 % — ABNORMAL LOW (ref 36.0–46.0)
Hemoglobin: 11.2 g/dL — ABNORMAL LOW (ref 12.0–15.0)
MCH: 29.4 pg (ref 26.0–34.0)
MCHC: 33.6 g/dL (ref 30.0–36.0)
MCV: 87.4 fL (ref 78.0–100.0)
PLATELETS: 235 10*3/uL (ref 150–400)
RBC: 3.81 MIL/uL — AB (ref 3.87–5.11)
RDW: 12.8 % (ref 11.5–15.5)
WBC: 11.6 10*3/uL — ABNORMAL HIGH (ref 4.0–10.5)

## 2013-09-01 MED ORDER — FERROUS SULFATE 325 (65 FE) MG PO TABS: 325.0000 mg | ORAL_TABLET | Freq: Three times a day (TID) | ORAL | Status: DC

## 2013-09-01 MED ORDER — CYCLOBENZAPRINE HCL 10 MG PO TABS: 10.0000 mg | ORAL_TABLET | Freq: Three times a day (TID) | ORAL | Status: DC | PRN

## 2013-09-01 MED ORDER — ASPIRIN 325 MG PO TBEC: 325.0000 mg | DELAYED_RELEASE_TABLET | Freq: Two times a day (BID) | ORAL | Status: AC

## 2013-09-01 MED ORDER — DSS 100 MG PO CAPS: 100.0000 mg | ORAL_CAPSULE | Freq: Two times a day (BID) | ORAL | Status: DC

## 2013-09-01 MED ORDER — POLYETHYLENE GLYCOL 3350 17 G PO PACK: 17.0000 g | PACK | Freq: Two times a day (BID) | ORAL | Status: DC

## 2013-09-01 MED ORDER — OXYCODONE HCL 5 MG PO TABS: 5.0000 mg | ORAL_TABLET | ORAL | Status: DC | PRN

## 2013-09-01 NOTE — Progress Notes (Signed)
[facility] is providing the following services: Declined rw and commode - has both at home.   If patient discharges after hours, please call [phone].   Linward Headland 09/01/2013, 11:13 AM

## 2013-09-01 NOTE — Progress Notes (Signed)
09/01/13 0931  OT G-codes **NOT FOR INPATIENT CLASS**  Functional Assessment Tool Used clinical judgement  Functional Limitation Self care  Self Care Current Status [phone]) CI  Self Care Goal Status (F5379) CI  Self Care Discharge Status [phone]) CI  Pauline Aus OTR/L [phone] 09/01/2013

## 2013-09-01 NOTE — Evaluation (Signed)
Occupational Therapy Evaluation Patient Details Name: Alice Smith MRN: [id number] DOB: 1945-12-20 Today's Date: 09/01/2013    History of Present Illness Pt with L UKR, and with recent RUKA 2 months ago.    Clinical Impression   Pt doing well with functional tasks despite pain level at 8/10 L knee (premedicated). She has help at d/c and all DME. All education reviewed. No further acute OT needs.    Follow Up Recommendations  No OT follow up    Equipment Recommendations  None recommended by OT    Recommendations for Other Services       Precautions / Restrictions Precautions Precautions: Knee Restrictions Weight Bearing Restrictions: No      Mobility Bed Mobility Overal bed mobility: Needs Assistance Bed Mobility: Supine to Sit     Supine to sit: Min guard        Transfers Overall transfer level: Needs assistance Equipment used: Rolling walker (2 wheeled) Transfers: Sit to/from Stand Sit to Stand: Min guard         General transfer comment: min verbal cues for LE management and hand placement.    Balance                                            ADL Overall ADL's : Needs assistance/impaired Eating/Feeding: Independent;Sitting   Grooming: Wash/dry hands;Set up;Sitting   Upper Body Bathing: Set up;Sitting   Lower Body Bathing: Minimal assistance;Sit to/from stand   Upper Body Dressing : Set up;Sitting   Lower Body Dressing: Minimal assistance;Sit to/from stand   Toilet Transfer: Min guard;Ambulation;BSC;RW   Toileting- Water quality scientist and Hygiene: Min guard;Sit to/from stand   Tub/ Shower Transfer: Minimal assistance;Walk-in shower;Rolling walker     General ADL Comments: Pt wanting to practice/review ADL. Practiced shower transfer and discussed placement of 3in1 as shower seat. Educated on where husband should hold walker to steady it as she steps in and out of shower. Discussed use of 3in1 as a BSC at night if  needed. She states husband can help with LB self care but reviewed sequence for LB dressing.      Vision                     Perception     Praxis      Pertinent Vitals/Pain 8/10 with activity; reposition, ice; premedicated.     Hand Dominance     Extremity/Trunk Assessment Upper Extremity Assessment Upper Extremity Assessment: Overall WFL for tasks assessed           Communication Communication Communication: No difficulties   Cognition Arousal/Alertness: Awake/alert Behavior During Therapy: WFL for tasks assessed/performed Overall Cognitive Status: Within Functional Limits for tasks assessed                     General Comments       Exercises       Shoulder Instructions      Home Living Family/patient expects to be discharged to:: Private residence Living Arrangements: Spouse/significant other Available Help at Discharge: Family Type of Home: House Home Access: Stairs to enter Technical brewer of Steps: 3 Entrance Stairs-Rails: None Home Layout: Multi-level;Able to live on main level with bedroom/bathroom     Bathroom Shower/Tub: Occupational psychologist: Standard     Home Equipment: Environmental consultant - 2 wheels;Bedside commode;Cane - single point  Prior Functioning/Environment Level of Independence: Independent        Comments: very active person    OT Diagnosis:     OT Problem List:     OT Treatment/Interventions:      OT Goals(Current goals can be found in the care plan section) Acute Rehab OT Goals Patient Stated Goal: to return to home and be active again  OT Frequency:     Barriers to D/C:            Co-evaluation              End of Session Equipment Utilized During Treatment: Gait belt;Rolling walker  Activity Tolerance: Patient limited by pain Patient left: in chair;with call bell/phone within reach   Time: 0900-0923 OT Time Calculation (min): 23 min Charges:  OT General  Charges $OT Visit: 1 Procedure OT Evaluation $Initial OT Evaluation Tier I: 1 Procedure OT Treatments $Therapeutic Activity: 8-22 mins G-Codes:    Jules Schick [phone] 09/01/2013, 9:31 AM

## 2013-09-01 NOTE — Progress Notes (Signed)
08/31/13 1740  PT Time Calculation  PT Start Time 1630  PT Stop Time 1707  PT Time Calculation (min) 37 min  PT G-Codes **NOT FOR INPATIENT CLASS**  Functional Assessment Tool Used clinical judgement  Functional Limitation Mobility: Walking and moving around  Mobility: Walking and Moving Around Current Status (Y4825) CJ  Mobility: Walking and Moving Around Goal Status (O0370) CI  PT General Charges  $$ ACUTE PT VISIT 1 Procedure  PT Evaluation  $Initial PT Evaluation Tier I 1 Procedure  PT Treatments  $Gait Training 8-22 mins  $Therapeutic Exercise 8-22 mins  [facility] [phone]

## 2013-09-01 NOTE — Progress Notes (Signed)
Physical Therapy Treatment Patient Details Name: Alice Smith MRN: [id number] DOB: 1945/10/24 Today's Date: 09/01/2013    History of Present Illness Pt with L UKR, and with recent RUKA 2 months ago.     PT Comments    Pt ready for DC  Follow Up Recommendations  Home health PT     Equipment Recommendations  None recommended by PT    Recommendations for Other Services       Precautions / Restrictions Precautions Precautions: Knee Restrictions Weight Bearing Restrictions: No    Mobility  Bed Mobility Overal bed mobility: Needs Assistance Bed Mobility: Supine to Sit     Supine to sit: Min guard        Transfers Overall transfer level: Needs assistance Equipment used: Rolling walker (2 wheeled) Transfers: Sit to/from Stand Sit to Stand: Supervision         General transfer comment: min verbal cues for LE management and hand placement.  Ambulation/Gait Ambulation/Gait assistance: Supervision Ambulation Distance (Feet): 80 Feet Assistive device: Rolling walker (2 wheeled) Gait Pattern/deviations: Step-to pattern;Step-through pattern;Antalgic         Stairs Stairs: Yes Stairs assistance: Min assist Stair Management: No rails;Backwards;With walker Number of Stairs: 3 General stair comments: cues for sequence  Wheelchair Mobility    Modified Rankin (Stroke Patients Only)       Balance                                    Cognition Arousal/Alertness: Awake/alert Behavior During Therapy: WFL for tasks assessed/performed Overall Cognitive Status: Within Functional Limits for tasks assessed                      Exercises Total Joint Exercises Ankle Circles/Pumps: AROM;Left;10 reps Quad Sets: AROM;Left;10 reps Short Arc Quad: AROM;Left;10 reps;Supine Heel Slides: AAROM;Left;10 reps Hip ABduction/ADduction: AROM;Left;10 reps;Supine Straight Leg Raises: AAROM;Left;10 reps;Supine Goniometric ROM: 0-90    General  Comments        Pertinent Vitals/Pain Pain l knee 6, ice, premed.    Home Living Family/patient expects to be discharged to:: Private residence Living Arrangements: Spouse/significant other Available Help at Discharge: Family Type of Home: House Home Access: Stairs to enter Entrance Stairs-Rails: None Home Layout: Multi-level;Able to live on main level with bedroom/bathroom Home Equipment: Gilford Rile - 2 wheels;Bedside commode;Cane - single point      Prior Function Level of Independence: Independent      Comments: very active person   PT Goals (current goals can now be found in the care plan section) Acute Rehab PT Goals Patient Stated Goal: to return to home and be active again Progress towards PT goals: Progressing toward goals    Frequency  7X/week    PT Plan Current plan remains appropriate    Co-evaluation             End of Session   Activity Tolerance: Patient tolerated treatment well Patient left: in chair;with call bell/phone within reach;with family/visitor present     Time: 1100-1127 PT Time Calculation (min): 27 min  Charges:  $Gait Training: 8-22 mins $Therapeutic Exercise: 8-22 mins                    G Codes:      Claretha Cooper 09/01/2013, 1:05 PM

## 2013-09-01 NOTE — Progress Notes (Signed)
Patient ID: Alice Smith, female   DOB: 02-08-1946, 68 y.o.   MRN: [id number] Subjective: 1 Day Post-Op Procedure(s) (LRB): LEFT UNICOMPARTMENTAL KNEE ATHROPLASTY   (MEDIAL)  (Left)    Patient reports pain as mild.  Feels that this knee was easier than her right, doing well, smiling this am   Objective:   VITALS:   Filed Vitals:   09/01/13 0724  BP:   Pulse:   Temp:   Resp: 18    Neurovascular intact Incision: dressing C/D/I  LABS  Recent Labs  09/01/13 0452  HGB 11.2*  HCT 33.3*  WBC 11.6*  PLT 235     Recent Labs  09/01/13 0452  NA 140  K 4.3  BUN 9  CREATININE 0.64  GLUCOSE 118*    No results found for this basename: LABPT, INR,  in the last 72 hours   Assessment/Plan: 1 Day Post-Op Procedure(s) (LRB): LEFT UNICOMPARTMENTAL KNEE ATHROPLASTY   (MEDIAL)  (Left)   Advance diet Up with therapy Discharge home with home health after therapy RTC in 2 weeks for wound check, ROM check

## 2013-09-04 NOTE — Discharge Summary (Signed)
Physician Discharge Summary  Patient ID: [name] MRN: [id number] DOB/AGE: 1945-03-11 68 y.o.  Admit date: 08/31/2013 Discharge date: 09/01/2013   Procedures:  Procedure(s) (LRB): LEFT UNICOMPARTMENTAL KNEE ATHROPLASTY   (MEDIAL)  (Left)  Attending Physician:  Dr. Paralee Cancel   Admission Diagnoses:   Left knee medial compartment OA / pain  Discharge Diagnoses:  Principal Problem:   S/P left UKR  Past Medical History  Diagnosis Date  . Migraine     evaluated with Dr. Domingo Cocking 11/2009  . Breast mass 05/2001    benign  . Dry cough     at night  . Arthritis   . Osteopenia   . Hemorrhoids   . Complication of anesthesia     slow to wake up x 1    HPI: Alice Smith, 68 y.o. female, has a history of pain and functional disability in the left knee due to arthritis and has failed non-surgical conservative treatments for greater than 12 weeks to include NSAID's and/or analgesics, corticosteriod injections, use of assistive devices and activity modification. Onset of symptoms was gradual, starting years ago with gradually worsening course since that time. The patient noted no past surgery on the left knee(s). Patient currently rates pain in the left knee(s) at 8 out of 10 with activity. Patient has night pain, worsening of pain with activity and weight bearing, pain that interferes with activities of daily living, pain with passive range of motion, crepitus and joint swelling. Patient has evidence of periarticular osteophytes and joint space narrowing of the medial compartment by imaging studies. There is no active infection. Risks, benefits and expectations were discussed with the patient. Risks including but not limited to the risk of anesthesia, blood clots, nerve damage, blood vessel damage, failure of the prosthesis, infection and up to and including death. Patient understand the risks, benefits and expectations and wishes to proceed with surgery.  PCP: Elsie Stain, MD    Discharged Condition: good  Hospital Course:  Patient underwent the above stated procedure on 08/31/2013. Patient tolerated the procedure well and brought to the recovery room in good condition and subsequently to the floor.  POD #1 BP: 111/64 ; Pulse: 85 ; Temp: 98.6 F (37 C) ; Resp: 14  Patient reports pain as mild. Feels that this knee was easier than her right, doing well, smiling this am. Ready to be discharged home. Dorsiflexion/plantar flexion intact, incision: dressing C/D/I, no cellulitis present and compartment soft.   LABS  Basename    HGB  11.2  HCT  33.3    Discharge Exam: General appearance: alert, cooperative and no distress Extremities: Homans sign is negative, no sign of DVT, no edema, redness or tenderness in the calves or thighs and no ulcers, gangrene or trophic changes  Disposition: Home with follow up in 2 weeks   Follow-up Information   Follow up with Mauri Pole, MD. Schedule an appointment as soon as possible for a visit in 2 weeks.   Specialty:  Orthopedic Surgery   Contact information:   [address] [place] [address] [phone]       Discharge Instructions   Call MD / Call 911    Complete by:  As directed   If you experience chest pain or shortness of breath, CALL 911 and be transported to the hospital emergency room.  If you develope a fever above 101 F, pus (white drainage) or increased drainage or redness at the wound, or calf pain, call your surgeon's  office.     Change dressing    Complete by:  As directed   Maintain surgical dressing for 10-14 days, or until follow up in the clinic.     Constipation Prevention    Complete by:  As directed   Drink plenty of fluids.  Prune juice may be helpful.  You may use a stool softener, such as Colace (over the counter) 100 mg twice a day.  Use MiraLax (over the counter) for constipation as needed.     Diet - low sodium heart healthy    Complete by:  As directed       Discharge instructions    Complete by:  As directed   Maintain surgical dressing for 10-14 days, or until follow up in the clinic. Follow up in 2 weeks at [facility]. Call with any questions or concerns.     Increase activity slowly as tolerated    Complete by:  As directed      TED hose    Complete by:  As directed   Use stockings (TED hose) for 2 weeks on both leg(s).  You may remove them at night for sleeping.     Weight bearing as tolerated    Complete by:  As directed   Laterality:  left  Extremity:  Lower             Medication List    STOP taking these medications       aspirin 325 MG tablet  Replaced by:  aspirin 325 MG EC tablet     oxycodone 5 MG capsule  Commonly known as:  OXY-IR  Replaced by:  oxyCODONE 5 MG immediate release tablet      TAKE these medications       aspirin 325 MG EC tablet  Take 1 tablet (325 mg total) by mouth 2 (two) times daily.     chlorpheniramine-HYDROcodone 10-8 MG/5ML Lqcr  Commonly known as:  TUSSIONEX  Take 5 mLs by mouth at bedtime as needed for cough.     cyclobenzaprine 10 MG tablet  Commonly known as:  FLEXERIL  Take 1 tablet (10 mg total) by mouth 3 (three) times daily as needed for muscle spasms.     DSS 100 MG Caps  Take 100 mg by mouth 2 (two) times daily.     ferrous sulfate 325 (65 FE) MG tablet  Take 1 tablet (325 mg total) by mouth 3 (three) times daily after meals.     metoprolol succinate 25 MG 24 hr tablet  Commonly known as:  TOPROL-XL  Take 50 mg by mouth every morning.     oxyCODONE 5 MG immediate release tablet  Commonly known as:  Oxy IR/ROXICODONE  Take 1-3 tablets (5-15 mg total) by mouth every 4 (four) hours as needed for severe pain.     polyethylene glycol packet  Commonly known as:  MIRALAX / GLYCOLAX  Take 17 g by mouth 2 (two) times daily.     promethazine 25 MG tablet  Commonly known as:  PHENERGAN  Take 25 mg by mouth 3 (three) times daily as needed for nausea or vomiting.      raloxifene 60 MG tablet  Commonly known as:  EVISTA  Take 60 mg by mouth every morning.         Signed: [place] Pugh. Angel Weedon   PA-C  09/04/2013, 8:10 AM

## 2013-09-13 ENCOUNTER — Other Ambulatory Visit: Payer: Self-pay | Admitting: Family Medicine

## 2013-10-18 ENCOUNTER — Other Ambulatory Visit: Payer: Self-pay | Admitting: Family Medicine

## 2013-10-29 ENCOUNTER — Other Ambulatory Visit: Payer: Self-pay

## 2013-10-29 NOTE — Telephone Encounter (Signed)
Pt left v/m requesting rx Tussionex. Call pt when ready for pick up. Pt is off med for knee replacements and request refill on tussionex.

## 2013-10-30 ENCOUNTER — Encounter: Payer: Self-pay | Admitting: Family Medicine

## 2013-10-30 MED ORDER — HYDROCOD POLST-CHLORPHEN POLST 10-8 MG/5ML PO LQCR: 5.0000 mL | Freq: Every evening | ORAL | Status: DC | PRN

## 2013-10-30 NOTE — Telephone Encounter (Signed)
Informed pt that RX ready to be picked up.

## 2013-10-30 NOTE — Telephone Encounter (Signed)
Printed.  Thanks.  

## 2013-11-12 ENCOUNTER — Encounter: Payer: Self-pay | Admitting: Family Medicine

## 2013-12-04 ENCOUNTER — Other Ambulatory Visit: Payer: Self-pay

## 2013-12-07 ENCOUNTER — Other Ambulatory Visit: Payer: Self-pay

## 2013-12-07 NOTE — Telephone Encounter (Signed)
Pt left v/m requesting rx for tussionex. Pt last seen 05/19/13 and no future appt scheduled.Please advise.

## 2013-12-08 ENCOUNTER — Other Ambulatory Visit: Payer: Self-pay

## 2013-12-08 DIAGNOSIS — Z1239 Encounter for other screening for malignant neoplasm of breast: Secondary | ICD-10-CM

## 2013-12-08 DIAGNOSIS — Z1231 Encounter for screening mammogram for malignant neoplasm of breast: Secondary | ICD-10-CM

## 2013-12-08 MED ORDER — HYDROCOD POLST-CHLORPHEN POLST 10-8 MG/5ML PO LQCR: 5.0000 mL | Freq: Every evening | ORAL | Status: DC | PRN

## 2013-12-08 NOTE — Telephone Encounter (Signed)
Spoke with patient and advised rx ready for pick-up and it will be at the front desk.  

## 2013-12-08 NOTE — Telephone Encounter (Signed)
Left message on answering machine to call back. Written script left up front for pickup.

## 2013-12-08 NOTE — Telephone Encounter (Signed)
Printed.  Thanks.  

## 2013-12-08 NOTE — Telephone Encounter (Signed)
Pt left v/m requesting status of tussionex rx.Please advise.

## 2014-01-01 ENCOUNTER — Ambulatory Visit
Admission: RE | Admit: 2014-01-01 | Discharge: 2014-01-01 | Disposition: A | Discharge status: Home / Self Care | Payer: BC Managed Care – PPO | Source: Ambulatory Visit

## 2014-01-01 DIAGNOSIS — Z1231 Encounter for screening mammogram for malignant neoplasm of breast: Secondary | ICD-10-CM

## 2014-01-05 ENCOUNTER — Encounter: Payer: Self-pay | Admitting: *Deleted

## 2014-01-11 ENCOUNTER — Other Ambulatory Visit: Payer: Self-pay | Admitting: Family Medicine

## 2014-01-12 ENCOUNTER — Other Ambulatory Visit: Payer: Self-pay

## 2014-01-12 MED ORDER — HYDROCOD POLST-CHLORPHEN POLST 10-8 MG/5ML PO LQCR: 5.0000 mL | Freq: Every evening | ORAL | Status: DC | PRN

## 2014-01-12 NOTE — Telephone Encounter (Signed)
Pt left v/m requesting rx tussionex. Pt request to pick up rx on 01/13/14.Please advise.

## 2014-01-12 NOTE — Telephone Encounter (Signed)
This is a chronic ongoing prescription by PCP so will refill and route to PCP. printed and in [facility]' box.

## 2014-01-13 NOTE — Telephone Encounter (Signed)
Thanks. This was an ongoing rx.

## 2014-01-13 NOTE — Telephone Encounter (Signed)
Patient notified and Rx placed up front for pick up. 

## 2014-02-05 ENCOUNTER — Ambulatory Visit (INDEPENDENT_AMBULATORY_CARE_PROVIDER_SITE_OTHER): Payer: BC Managed Care – PPO | Admitting: Family Medicine

## 2014-02-05 ENCOUNTER — Encounter: Payer: Self-pay | Admitting: Family Medicine

## 2014-02-05 VITALS — BP 122/80 | HR 92 | Temp 98.3°F | Wt 161.5 lb

## 2014-02-05 DIAGNOSIS — J069 Acute upper respiratory infection, unspecified: Secondary | ICD-10-CM

## 2014-02-05 DIAGNOSIS — J029 Acute pharyngitis, unspecified: Secondary | ICD-10-CM

## 2014-02-05 LAB — POCT RAPID STREP A (OFFICE): Rapid Strep A Screen: NEGATIVE

## 2014-02-05 MED ORDER — HYDROCOD POLST-CHLORPHEN POLST 10-8 MG/5ML PO LQCR: 5.0000 mL | Freq: Every evening | ORAL | Status: DC | PRN

## 2014-02-05 NOTE — Patient Instructions (Signed)
This looks to be viral.  Drink plenty of fluids, take tylenol as needed, and gargle with warm salt water for your throat.   This should gradually improve.  Take care.  Let us know if you have other concerns.   Glad to see you.

## 2014-02-05 NOTE — Progress Notes (Signed)
Pre visit review using our clinic review tool, if applicable. No additional management support is needed unless otherwise documented below in the visit note.  Mult sick contacts at home.  Once with ST and PNA.  Dry cough, irritated throat.  Rhinorrhea.  No fevers known.  Allegra D has helped some recently.   Some ear pressure.  Sx started yesterday.  No vomiting, no diarrhea. No rash.  Voice is hoarse.   Meds, vitals, and allergies reviewed.   ROS: See HPI.  Otherwise, noncontributory.  GEN: nad, alert and oriented HEENT: mucous membranes moist, tm w/o erythema, nasal exam w/o erythema, clear discharge noted,  OP with cobblestoning, sinuses not ttp x4 NECK: supple w/o LA CV: rrr.   PULM: ctab, no inc wob EXT: no edema SKIN: no acute rash  RST neg.

## 2014-02-07 DIAGNOSIS — J069 Acute upper respiratory infection, unspecified: Secondary | ICD-10-CM | POA: Insufficient documentation

## 2014-02-07 NOTE — Assessment & Plan Note (Signed)
This looks to be viral.  Nontoxic.  rst neg.  D/w pt about supportive care.  F/u prn.  She agrees.

## 2014-03-19 ENCOUNTER — Other Ambulatory Visit: Payer: Self-pay | Admitting: Family Medicine

## 2014-03-23 ENCOUNTER — Other Ambulatory Visit: Payer: Self-pay

## 2014-03-23 NOTE — Telephone Encounter (Signed)
Pt left v/m requesting rx tussionex. Call when ready for pick up.

## 2014-03-24 MED ORDER — HYDROCOD POLST-CHLORPHEN POLST 10-8 MG/5ML PO LQCR: 5.0000 mL | Freq: Every evening | ORAL | Status: DC | PRN

## 2014-03-24 NOTE — Telephone Encounter (Signed)
Patient notified that script is up front ready for pickup.

## 2014-03-24 NOTE — Telephone Encounter (Signed)
Received refill request electronically. Last refill 01/26/14 #12ml, last office visit 02/05/14. Is it okay to refill medication?

## 2014-03-24 NOTE — Telephone Encounter (Signed)
Please print and I'll sign.  Thanks.

## 2014-04-27 ENCOUNTER — Other Ambulatory Visit: Payer: Self-pay

## 2014-04-27 NOTE — Telephone Encounter (Signed)
Pt left /vm requesting rx tussionex. Call when ready for pick up. Pt last seen 02/05/14.

## 2014-04-28 MED ORDER — HYDROCOD POLST-CHLORPHEN POLST 10-8 MG/5ML PO LQCR: 5.0000 mL | Freq: Every evening | ORAL | Status: DC | PRN

## 2014-04-28 NOTE — Telephone Encounter (Signed)
Printed.  Thanks.  

## 2014-04-28 NOTE — Telephone Encounter (Signed)
Patient informed tussionex rx is ready for pick up.

## 2014-06-03 ENCOUNTER — Telehealth: Payer: Self-pay

## 2014-06-03 MED ORDER — HYDROCOD POLST-CPM POLST ER 10-8 MG/5ML PO SUER: ORAL | Status: DC

## 2014-06-03 NOTE — Telephone Encounter (Signed)
Patient advised.  Rx left at front desk for pick up. 

## 2014-06-03 NOTE — Telephone Encounter (Signed)
Printed.  Due for CPE this summer, please schedule.  Thanks.

## 2014-06-03 NOTE — Telephone Encounter (Signed)
Pt left v/m requesting rx for tussionex; pt request cb when ready for pick up. Cannot do order under meds and orders, needs new rx.

## 2014-07-06 ENCOUNTER — Other Ambulatory Visit: Payer: Self-pay

## 2014-07-06 MED ORDER — HYDROCOD POLST-CPM POLST ER 10-8 MG/5ML PO SUER: ORAL | Status: DC

## 2014-07-06 NOTE — Telephone Encounter (Signed)
I can't sign until I get to clinic.  We need 24h notice on med refills.

## 2014-07-06 NOTE — Telephone Encounter (Signed)
Pt left v/m requesting rx tussionex. Call when ready for pick up; pt request to pick up this afternoon; pt last seen 02/05/14; pt has CPX scheduled 08/31/14. Last printed # 150 ml on 06/03/14.

## 2014-07-06 NOTE — Telephone Encounter (Signed)
Patient advised.  Rx left at front desk for pick up. 

## 2014-07-11 ENCOUNTER — Other Ambulatory Visit: Payer: Self-pay | Admitting: Family Medicine

## 2014-07-12 ENCOUNTER — Encounter: Payer: Self-pay | Admitting: Gastroenterology

## 2014-08-09 ENCOUNTER — Other Ambulatory Visit: Payer: Self-pay

## 2014-08-09 NOTE — Telephone Encounter (Signed)
Pt left v/m requesting rx tussionex. Call when ready for pick up. Last printed # 150 ml on 07/06/14 and last seen 02/05/14.

## 2014-08-10 MED ORDER — HYDROCOD POLST-CPM POLST ER 10-8 MG/5ML PO SUER: ORAL | Status: DC

## 2014-08-10 NOTE — Telephone Encounter (Signed)
Patient advised.  Rx left at front desk for pick up. 

## 2014-08-10 NOTE — Telephone Encounter (Signed)
Printed.  Thanks.  

## 2014-08-16 ENCOUNTER — Other Ambulatory Visit: Payer: Self-pay

## 2014-08-24 ENCOUNTER — Other Ambulatory Visit: Payer: Self-pay | Admitting: Family Medicine

## 2014-08-24 DIAGNOSIS — M899 Disorder of bone, unspecified: Secondary | ICD-10-CM

## 2014-08-24 DIAGNOSIS — M949 Disorder of cartilage, unspecified: Secondary | ICD-10-CM

## 2014-08-24 DIAGNOSIS — M8580 Other specified disorders of bone density and structure, unspecified site: Secondary | ICD-10-CM

## 2014-08-24 DIAGNOSIS — E785 Hyperlipidemia, unspecified: Secondary | ICD-10-CM

## 2014-08-26 ENCOUNTER — Other Ambulatory Visit (INDEPENDENT_AMBULATORY_CARE_PROVIDER_SITE_OTHER): Payer: BLUE CROSS/BLUE SHIELD

## 2014-08-26 DIAGNOSIS — M949 Disorder of cartilage, unspecified: Secondary | ICD-10-CM

## 2014-08-26 DIAGNOSIS — M899 Disorder of bone, unspecified: Secondary | ICD-10-CM | POA: Diagnosis not present

## 2014-08-26 DIAGNOSIS — E785 Hyperlipidemia, unspecified: Secondary | ICD-10-CM | POA: Diagnosis not present

## 2014-08-26 LAB — COMPREHENSIVE METABOLIC PANEL
ALK PHOS: 130 U/L — AB (ref 39–117)
ALT: 22 U/L (ref 0–35)
AST: 25 U/L (ref 0–37)
Albumin: 4 g/dL (ref 3.5–5.2)
BUN: 19 mg/dL (ref 6–23)
CHLORIDE: 107 meq/L (ref 96–112)
CO2: 25 meq/L (ref 19–32)
CREATININE: 0.81 mg/dL (ref 0.40–1.20)
Calcium: 9.3 mg/dL (ref 8.4–10.5)
GFR: 74.45 mL/min (ref >60.00)
Glucose, Bld: 97 mg/dL (ref 70–99)
POTASSIUM: 4.4 meq/L (ref 3.5–5.1)
Sodium: 140 mEq/L (ref 135–145)
TOTAL PROTEIN: 6.9 g/dL (ref 6.0–8.3)
Total Bilirubin: 0.3 mg/dL (ref 0.2–1.2)

## 2014-08-26 LAB — VITAMIN D 25 HYDROXY (VIT D DEFICIENCY, FRACTURES): VITD: 23.62 ng/mL — AB (ref 30.00–100.00)

## 2014-08-26 LAB — LIPID PANEL
CHOL/HDL RATIO: 3
Cholesterol: 206 mg/dL — ABNORMAL HIGH (ref 0–200)
HDL: 59.7 mg/dL (ref >39.00)
LDL Cholesterol: 122 mg/dL — ABNORMAL HIGH (ref 0–99)
NONHDL: 146.3
Triglycerides: 122 mg/dL (ref 0.0–149.0)
VLDL: 24.4 mg/dL (ref 0.0–40.0)

## 2014-08-31 ENCOUNTER — Encounter: Payer: Self-pay | Admitting: Family Medicine

## 2014-08-31 ENCOUNTER — Ambulatory Visit (INDEPENDENT_AMBULATORY_CARE_PROVIDER_SITE_OTHER): Payer: BLUE CROSS/BLUE SHIELD | Admitting: Family Medicine

## 2014-08-31 VITALS — BP 132/86 | HR 78 | Temp 98.3°F | Ht 62.0 in | Wt 178.0 lb

## 2014-08-31 DIAGNOSIS — M8580 Other specified disorders of bone density and structure, unspecified site: Secondary | ICD-10-CM | POA: Diagnosis not present

## 2014-08-31 DIAGNOSIS — R05 Cough: Secondary | ICD-10-CM

## 2014-08-31 DIAGNOSIS — G43009 Migraine without aura, not intractable, without status migrainosus: Secondary | ICD-10-CM

## 2014-08-31 DIAGNOSIS — Z23 Encounter for immunization: Secondary | ICD-10-CM | POA: Diagnosis not present

## 2014-08-31 DIAGNOSIS — E559 Vitamin D deficiency, unspecified: Secondary | ICD-10-CM

## 2014-08-31 DIAGNOSIS — R748 Abnormal levels of other serum enzymes: Secondary | ICD-10-CM

## 2014-08-31 DIAGNOSIS — Z7189 Other specified counseling: Secondary | ICD-10-CM

## 2014-08-31 DIAGNOSIS — Z0000 Encounter for general adult medical examination without abnormal findings: Secondary | ICD-10-CM

## 2014-08-31 DIAGNOSIS — R059 Cough, unspecified: Secondary | ICD-10-CM

## 2014-08-31 DIAGNOSIS — E2839 Other primary ovarian failure: Secondary | ICD-10-CM

## 2014-08-31 MED ORDER — HYDROCOD POLST-CPM POLST ER 10-8 MG/5ML PO SUER: ORAL | Status: DC

## 2014-08-31 MED ORDER — METOPROLOL SUCCINATE ER 50 MG PO TB24: 50.0000 mg | ORAL_TABLET | Freq: Every morning | ORAL | Status: DC

## 2014-08-31 MED ORDER — CHOLECALCIFEROL 25 MCG (1000 UT) PO TABS: 2000.0000 [IU] | ORAL_TABLET | Freq: Every day | ORAL | Status: DC

## 2014-08-31 NOTE — Patient Instructions (Addendum)
I would get a flu shot each fall.   Check to see about your insurance covering a tetanus shot.  It may be cheaper at the pharmacy.   Rosaria Ferries will call about your referral f0r a bone density exam.  Recheck labs in about 2 months. Add on 2000 units of vitamin D a day.   Take care.  Glad to see you.

## 2014-08-31 NOTE — Progress Notes (Signed)
Pre visit review using our clinic review tool, if applicable. No additional management support is needed unless otherwise documented below in the visit note.  I have personally reviewed the Medicare Annual Wellness questionnaire and have noted 1. The patient's medical and social history 2. Their use of alcohol, tobacco or illicit drugs 3. Their current medications and supplements 4. The patient's functional ability including ADL's, fall risks, home safety risks and hearing or visual             impairment. 5. Diet and physical activities 6. Evidence for depression or mood disorders  The patients weight, height, BMI have been recorded in the chart and visual acuity is per eye clinic.  I have made referrals, counseling and provided education to the patient based review of the above and I have provided the pt with a written personalized care plan for preventive services.  Provider list updated- see scanned forms.  Routine anticipatory guidance given to patient.  See health maintenance.  Flu 2015 Shingles 2008 PNA updated  Tetanus d/w pt.  Colonoscopy 2009 Breast cancer screening- mammogram 12/2013 DXA pending.  Pap not indicated.  Advance directive- Husband or daughter [name] designated if patient were incapacitated.  Cognitive function addressed- see scanned forms- and if abnormal then additional documentation follows.   Mother recent dx'd with lung cancer with poor prognosis.  I offered my condolences.   Migraine. Overall improved with current meds compared to prev historical sx, but with inc in sx (as expected) with stress from mother's illness.  No ADE on meds.    Cough.  Sig w/u prev noted, improved with prev fundoplication.  Still with cough at night, improved with prn hycodan, no ADE on med. D/w pt.  Nothing else really helped the nighttime cough. At this point, benefit appears >>> risk.  D/w pt.  No other sig sx, ie sputum, etc.   Low vit D.  D/w pt.  H/o osteopenia.  Recheck  DXA pending and will start vit D replacement.  See AVS.    Alk phos elevation.  No RUQ sx, other LFTs wnl.  No abd pain.  Likely isolated event, d/w pt about recheck later on.  She agrees.   PMH and SH reviewed  Meds, vitals, and allergies reviewed.   ROS: See HPI.  Otherwise negative.    GEN: nad, alert and oriented HEENT: mucous membranes moist NECK: supple w/o LA CV: rrr. PULM: ctab, no inc wob ABD: soft, +bs EXT: no edema SKIN: no acute rash

## 2014-09-01 DIAGNOSIS — E559 Vitamin D deficiency, unspecified: Secondary | ICD-10-CM | POA: Insufficient documentation

## 2014-09-01 DIAGNOSIS — R748 Abnormal levels of other serum enzymes: Secondary | ICD-10-CM | POA: Insufficient documentation

## 2014-09-01 DIAGNOSIS — Z7189 Other specified counseling: Secondary | ICD-10-CM | POA: Insufficient documentation

## 2014-09-01 NOTE — Assessment & Plan Note (Signed)
Sig w/u prev noted, improved with prev fundoplication. Still with cough at night, improved with prn hycodan, no ADE on med. D/w pt. Nothing else really helped the nighttime cough. At this point, benefit appears >>> risk. D/w pt. No other sig sx, ie sputum, etc.

## 2014-09-01 NOTE — Assessment & Plan Note (Addendum)
Flu 2015 Shingles 2008 PNA updated  Tetanus d/w pt.  Colonoscopy 2009 Breast cancer screening- mammogram 12/2013 DXA pending.  Pap not indicated.  Advance directive- Husband or daughter [name] designated if patient were incapacitated.  Cognitive function addressed- see scanned forms- and if abnormal then additional documentation follows.  Diet and exercise d/w pt.

## 2014-09-01 NOTE — Assessment & Plan Note (Signed)
Add on vit D 2000 units a day and recheck in a few months.  She agrees.

## 2014-09-01 NOTE — Assessment & Plan Note (Signed)
Continue current meds as is.  D/w pt.  She agrees.

## 2014-09-01 NOTE — Assessment & Plan Note (Signed)
Likely incidental, recheck in a few months.  Benign abd exam.

## 2014-09-01 NOTE — Assessment & Plan Note (Signed)
Recheck DXA.  D/w pt.  She agrees.

## 2014-09-05 ENCOUNTER — Other Ambulatory Visit: Payer: Self-pay | Admitting: Family Medicine

## 2014-09-08 ENCOUNTER — Telehealth: Payer: Self-pay

## 2014-09-08 NOTE — Telephone Encounter (Signed)
Pt said ins has 100 % coverage of tdap and request afternoon appt; appt scheduled 09/09/14.

## 2014-09-09 ENCOUNTER — Ambulatory Visit (INDEPENDENT_AMBULATORY_CARE_PROVIDER_SITE_OTHER): Payer: BLUE CROSS/BLUE SHIELD | Admitting: *Deleted

## 2014-09-09 ENCOUNTER — Other Ambulatory Visit: Payer: Self-pay | Admitting: *Deleted

## 2014-09-09 DIAGNOSIS — Z23 Encounter for immunization: Secondary | ICD-10-CM | POA: Diagnosis not present

## 2014-09-09 NOTE — Telephone Encounter (Signed)
Pt was here for nurse visit. She stated that today she was having a migraine, with some nausea and requested rx for phenergan.

## 2014-09-10 MED ORDER — PROMETHAZINE HCL 25 MG PO TABS: 25.0000 mg | ORAL_TABLET | Freq: Three times a day (TID) | ORAL | Status: DC | PRN

## 2014-09-10 NOTE — Telephone Encounter (Signed)
Sent. Thanks.   

## 2014-09-13 ENCOUNTER — Ambulatory Visit (INDEPENDENT_AMBULATORY_CARE_PROVIDER_SITE_OTHER)
Admission: RE | Admit: 2014-09-13 | Discharge: 2014-09-13 | Disposition: A | Discharge status: Home / Self Care | Payer: BLUE CROSS/BLUE SHIELD | Source: Ambulatory Visit | Attending: Family Medicine | Admitting: Family Medicine

## 2014-09-13 DIAGNOSIS — E2839 Other primary ovarian failure: Secondary | ICD-10-CM

## 2014-09-13 DIAGNOSIS — M810 Age-related osteoporosis without current pathological fracture: Secondary | ICD-10-CM

## 2014-09-23 ENCOUNTER — Other Ambulatory Visit: Payer: Self-pay | Admitting: Family Medicine

## 2014-09-24 ENCOUNTER — Encounter: Payer: Self-pay | Admitting: Family Medicine

## 2014-09-24 ENCOUNTER — Ambulatory Visit (INDEPENDENT_AMBULATORY_CARE_PROVIDER_SITE_OTHER): Payer: BLUE CROSS/BLUE SHIELD | Admitting: Family Medicine

## 2014-09-24 VITALS — BP 142/80 | HR 93 | Temp 98.7°F | Wt 178.5 lb

## 2014-09-24 DIAGNOSIS — M810 Age-related osteoporosis without current pathological fracture: Secondary | ICD-10-CM

## 2014-09-24 NOTE — Progress Notes (Signed)
Pre visit review using our clinic review tool, if applicable. No additional management support is needed unless otherwise documented below in the visit note.  DXA f/u.  Now with osteoporosis. She has been on evista for years w/o ADE.  D/w pt about risk and benefits of meds.  She has h/o fundoplication.  She doesn't have dysphagia.  At this point for her situation, bisphosphonate would likely be most useful.  D/w pt about risks and benefits, specifically long bone fx.  All questions answered and DXA d/w pt in detail.    Her mother is still at home in [facility] with likely terminal cancer.   Meds, vitals, and allergies reviewed.   ROS: See HPI.  Otherwise, noncontributory.  nad Exam deferred o/w.

## 2014-09-24 NOTE — Patient Instructions (Signed)
I'll check on some options about fosamax or a similar med.   We'll be in touch.  Take care.  Glad to see you.

## 2014-09-27 NOTE — Assessment & Plan Note (Addendum)
She has been on evista for years w/o ADE.  D/w pt about risk and benefits of meds.  She has h/o fundoplication.  She doesn't have dysphagia.  At this point for her situation, bisphosphonate would likely be most useful.  D/w pt about risks and benefits, specifically long bone fx.  All questions answered and DXA d/w pt in detail.   I'll check on oral vs IV bisphosphonate for her and will give her extra information.  >15 minutes spent in face to face time with patient, >50% spent in counselling or coordination of care.

## 2014-09-29 ENCOUNTER — Telehealth: Payer: Self-pay | Admitting: Family Medicine

## 2014-09-29 ENCOUNTER — Encounter: Payer: Self-pay | Admitting: Family Medicine

## 2014-09-29 DIAGNOSIS — M810 Age-related osteoporosis without current pathological fracture: Secondary | ICD-10-CM

## 2014-09-29 MED ORDER — ALENDRONATE SODIUM 70 MG PO TABS: 70.0000 mg | ORAL_TABLET | ORAL | Status: DC

## 2014-09-29 NOTE — Telephone Encounter (Signed)
Patient notified and said she wants to go ahead try Rx. She asked that you send it to [facility].

## 2014-09-29 NOTE — Telephone Encounter (Signed)
Message left advising patient.  

## 2014-09-29 NOTE — Telephone Encounter (Signed)
Sent.  Stop raloxifene.  Make sure to follow directions on use- on the label.   Update me if the cough is worse when on the medicine or if she has more heartburn.  Thanks.

## 2014-09-29 NOTE — Telephone Encounter (Signed)
Please notify pt.  I did some extra checking on her situation.  It would still be okay to try fosmax, even with her fundoplication history.  If she didn't tolerate it (ie if her cough got worse on the medicine), then we would stop the medicine.  She would then likely return to her baseline.   There isn't a contraindication to the medicine.  Have her consider it and update me.   I think it is reasonable to try it.   Thanks.

## 2014-10-18 ENCOUNTER — Other Ambulatory Visit: Payer: Self-pay | Admitting: *Deleted

## 2014-10-18 MED ORDER — HYDROCOD POLST-CPM POLST ER 10-8 MG/5ML PO SUER: ORAL | Status: DC

## 2014-10-18 NOTE — Telephone Encounter (Signed)
Noted.  I printed rx.  I called patient to offer my condolences.  No answer.   LMOVM.   I didn't leave confidential information.  I did note on the message the item she requested would be up front for pick up.  Thanks.

## 2014-10-18 NOTE — Telephone Encounter (Signed)
Patient left a voicemail requesting a refill on Tussionex Last refill 08/31/14  150 ML Last office visit 09/24/14 Patient wanted to let you know that her mom passed away last week

## 2014-10-18 NOTE — Telephone Encounter (Signed)
Rx left at front desk for pick up.

## 2014-11-01 ENCOUNTER — Other Ambulatory Visit: Payer: Medicare Other

## 2014-11-17 ENCOUNTER — Other Ambulatory Visit: Payer: Self-pay

## 2014-11-17 MED ORDER — HYDROCOD POLST-CPM POLST ER 10-8 MG/5ML PO SUER: ORAL | Status: DC

## 2014-11-17 NOTE — Telephone Encounter (Signed)
Done. Thanks.

## 2014-11-17 NOTE — Telephone Encounter (Signed)
Patient notified. Rx placed up front for pick up. 

## 2014-11-17 NOTE — Telephone Encounter (Signed)
Pt left v/m requesting rx tussionex. Call when ready for pick up. rx last printed # 150 ml on 10/18/14;last annual exam on 08/31/14.

## 2014-12-16 ENCOUNTER — Other Ambulatory Visit: Payer: Self-pay | Admitting: Family Medicine

## 2014-12-16 MED ORDER — HYDROCOD POLST-CPM POLST ER 10-8 MG/5ML PO SUER: ORAL | Status: DC

## 2014-12-16 NOTE — Telephone Encounter (Signed)
Printed.  Thanks.  

## 2014-12-16 NOTE — Telephone Encounter (Signed)
Pt left voicemail at Triage requesting refill of med. Pt said she will be out of med by tomorrow, last refilled on 11/17/14 #183ml with 0 refills

## 2014-12-16 NOTE — Telephone Encounter (Signed)
Left detailed message on voicemail.  

## 2014-12-27 ENCOUNTER — Encounter: Payer: Self-pay | Admitting: Family Medicine

## 2014-12-27 ENCOUNTER — Ambulatory Visit (INDEPENDENT_AMBULATORY_CARE_PROVIDER_SITE_OTHER): Payer: BLUE CROSS/BLUE SHIELD | Admitting: Family Medicine

## 2014-12-27 VITALS — BP 112/74 | HR 91 | Temp 97.6°F | Wt 174.5 lb

## 2014-12-27 DIAGNOSIS — R591 Generalized enlarged lymph nodes: Secondary | ICD-10-CM

## 2014-12-27 DIAGNOSIS — R599 Enlarged lymph nodes, unspecified: Secondary | ICD-10-CM

## 2014-12-27 DIAGNOSIS — J020 Streptococcal pharyngitis: Secondary | ICD-10-CM | POA: Diagnosis not present

## 2014-12-27 LAB — POCT RAPID STREP A (OFFICE): RAPID STREP A SCREEN: POSITIVE — AB

## 2014-12-27 MED ORDER — AMOXICILLIN 875 MG PO TABS: 875.0000 mg | ORAL_TABLET | Freq: Two times a day (BID) | ORAL | Status: AC

## 2014-12-27 NOTE — Patient Instructions (Signed)
Start amoxil today.  Drink plenty of fluids, take tylenol as needed, and gargle with warm salt water for your throat.  This should gradually improve.  Take care.  Let us know if you have other concerns.    

## 2014-12-27 NOTE — Progress Notes (Signed)
Pre visit review using our clinic review tool, if applicable. No additional management support is needed unless otherwise documented below in the visit note.  Known sick contacts.  Sx started about 4 days ago.  Had aches in the joints, some ST, then had swollen glands in the neck- that is better today.  Pain with swallowing prev.  Some ear pain.  Some rhinorrhea. Still with some cough, some mild discolored sputum now.  No fevers >100.4.  No chills.   Meds, vitals, and allergies reviewed.   ROS: See HPI.  Otherwise, noncontributory.  GEN: nad, alert and oriented HEENT: mucous membranes moist, tm w/o erythema, nasal exam w/o erythema, clear discharge noted,  OP with posterior erythema noted, no exudates NECK: supple w/ tender bilateral LA CV: rrr.   PULM: ctab, no inc wob EXT: no edema  RST pos

## 2014-12-28 ENCOUNTER — Encounter: Payer: Self-pay | Admitting: Family Medicine

## 2014-12-28 DIAGNOSIS — J020 Streptococcal pharyngitis: Secondary | ICD-10-CM | POA: Insufficient documentation

## 2014-12-28 NOTE — Assessment & Plan Note (Signed)
rst pos.  D/w pt.  She can take amoxil.  No h/o true allergy, ie no rash, etc.  Supportive care.  Start amoxil.  F/u prn.  She agrees.

## 2015-01-17 ENCOUNTER — Other Ambulatory Visit: Payer: Self-pay

## 2015-01-17 MED ORDER — HYDROCOD POLST-CPM POLST ER 10-8 MG/5ML PO SUER: ORAL | Status: DC

## 2015-01-17 NOTE — Telephone Encounter (Signed)
Pt left v/m requesting rx tussionex. Call when ready for pick up. Last printed # 150 ml on 12/16/14. Last seen 12/27/14.

## 2015-01-17 NOTE — Telephone Encounter (Signed)
Printed.  Thanks.  

## 2015-01-18 NOTE — Telephone Encounter (Signed)
Patient advised.  Rx left at front desk for pick up. 

## 2015-01-31 ENCOUNTER — Telehealth: Payer: Self-pay | Admitting: Family Medicine

## 2015-01-31 DIAGNOSIS — Z119 Encounter for screening for infectious and parasitic diseases, unspecified: Secondary | ICD-10-CM

## 2015-01-31 NOTE — Telephone Encounter (Signed)
Left message on patient's voicemail to return call

## 2015-01-31 NOTE — Telephone Encounter (Signed)
Pt wanted to know if she has been tested for hep c Please advise

## 2015-01-31 NOTE — Telephone Encounter (Signed)
Not yet, I put in order to be done with next set of labs.  Would have d/w pt at next CPE.  Thanks.

## 2015-02-01 NOTE — Telephone Encounter (Signed)
Left message on patient's voicemail to return call

## 2015-02-02 ENCOUNTER — Other Ambulatory Visit (INDEPENDENT_AMBULATORY_CARE_PROVIDER_SITE_OTHER): Payer: BLUE CROSS/BLUE SHIELD

## 2015-02-02 ENCOUNTER — Other Ambulatory Visit: Payer: Self-pay | Admitting: Family Medicine

## 2015-02-02 DIAGNOSIS — R748 Abnormal levels of other serum enzymes: Secondary | ICD-10-CM

## 2015-02-02 DIAGNOSIS — E559 Vitamin D deficiency, unspecified: Secondary | ICD-10-CM | POA: Diagnosis not present

## 2015-02-02 DIAGNOSIS — Z119 Encounter for screening for infectious and parasitic diseases, unspecified: Secondary | ICD-10-CM

## 2015-02-02 LAB — HEPATIC FUNCTION PANEL
ALK PHOS: 98 U/L (ref 39–117)
ALT: 23 U/L (ref 0–35)
AST: 25 U/L (ref 0–37)
Albumin: 4.2 g/dL (ref 3.5–5.2)
BILIRUBIN DIRECT: 0.1 mg/dL (ref 0.0–0.3)
TOTAL PROTEIN: 7.6 g/dL (ref 6.0–8.3)
Total Bilirubin: 0.5 mg/dL (ref 0.2–1.2)

## 2015-02-02 LAB — VITAMIN D 25 HYDROXY (VIT D DEFICIENCY, FRACTURES): VITD: 35.27 ng/mL (ref 30.00–100.00)

## 2015-02-02 NOTE — Telephone Encounter (Signed)
Patient notified as instructed by telephone. Lab appointment scheduled. 

## 2015-02-03 LAB — HEPATITIS C ANTIBODY: HCV AB: NEGATIVE

## 2015-02-16 ENCOUNTER — Other Ambulatory Visit: Payer: Self-pay

## 2015-02-16 MED ORDER — HYDROCOD POLST-CPM POLST ER 10-8 MG/5ML PO SUER: ORAL | Status: DC

## 2015-02-16 NOTE — Telephone Encounter (Signed)
Patient advised.  Rx left at front desk for pick up. 

## 2015-02-16 NOTE — Telephone Encounter (Signed)
Pt left v/m requesting rx tussionex. Call when ready for pick up.last printed # 150 ml on 01/17/15; seen 08/31/14 annual and last acute visit 12/27/14.

## 2015-02-16 NOTE — Telephone Encounter (Signed)
Printed.  Thanks.  

## 2015-02-23 ENCOUNTER — Ambulatory Visit: Payer: BLUE CROSS/BLUE SHIELD | Admitting: Family Medicine

## 2015-02-23 ENCOUNTER — Encounter: Payer: Self-pay | Admitting: Primary Care

## 2015-02-23 ENCOUNTER — Ambulatory Visit (INDEPENDENT_AMBULATORY_CARE_PROVIDER_SITE_OTHER)
Admission: RE | Admit: 2015-02-23 | Discharge: 2015-02-23 | Disposition: A | Discharge status: Home / Self Care | Payer: BLUE CROSS/BLUE SHIELD | Source: Ambulatory Visit | Attending: Primary Care | Admitting: Primary Care

## 2015-02-23 ENCOUNTER — Ambulatory Visit (INDEPENDENT_AMBULATORY_CARE_PROVIDER_SITE_OTHER): Payer: BLUE CROSS/BLUE SHIELD | Admitting: Primary Care

## 2015-02-23 VITALS — BP 148/84 | HR 136 | Temp 99.1°F | Ht 62.0 in | Wt 177.4 lb

## 2015-02-23 DIAGNOSIS — R05 Cough: Secondary | ICD-10-CM

## 2015-02-23 DIAGNOSIS — R059 Cough, unspecified: Secondary | ICD-10-CM

## 2015-02-23 MED ORDER — HYDROCODONE-HOMATROPINE 5-1.5 MG/5ML PO SYRP: 5.0000 mL | ORAL_SOLUTION | Freq: Every evening | ORAL | Status: DC | PRN

## 2015-02-23 MED ORDER — DOXYCYCLINE HYCLATE 100 MG PO TABS: 100.0000 mg | ORAL_TABLET | Freq: Two times a day (BID) | ORAL | Status: DC

## 2015-02-23 NOTE — Patient Instructions (Signed)
Complete xray(s) prior to leaving today. I will notify you of your results once received.  Start Doxycycline antibiotic. Take 1 tablet by mouth twice daily for 10 days.  You may take the Hycodan cough suppressant at bedtime as needed for cough and rest. Caution this medication contains codeine and will make you feel drowsy.  Start taking Mucinex DM for chest congestion and cough. Ensure that your are taking this with a full glass of water.  Increase consumption of fluids and rest.  Please call me if no improvement in 3-4 days.  It was a pleasure meeting you!

## 2015-02-23 NOTE — Progress Notes (Signed)
Subjective:    Patient ID: Alice Smith, female    DOB: Nov 29, 1945, 70 y.o.   MRN: [id number]  HPI  Alice Smith is a 70 year old female who presents today with a chief complaint of cough. She also reports shortness of breath, low grade fever, body aches, chills, sinus pressure. Her cough is productive with green sputum. Her symptoms have been present for 2+ weeks. She's taken allergra-D, benadryl without improvement. She's been around her daughter and grandchild who were recently ill.   Review of Systems  Constitutional: Positive for fever, chills and fatigue.  HENT: Positive for congestion and sore throat. Negative for ear pain.   Respiratory: Positive for cough and shortness of breath. Negative for wheezing.   Cardiovascular: Negative for chest pain.       Past Medical History  Diagnosis Date  . Migraine     evaluated with Dr. Domingo Cocking 11/2009  . Breast mass 05/2001    benign  . Dry cough     at night  . Arthritis   . Osteoporosis   . Hemorrhoids   . Complication of anesthesia     slow to wake up x 1    Social History   Social History  . Marital Status: Married    Spouse Name: N/A  . Number of Children: 3  . 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 Smoking status: Former Smoker -- 1.00 packs/day for 5 years    Types: Cigarettes    Quit date: 06/17/1978  . Smokeless tobacco: Never Used     Comment: 5 pack year history  . Alcohol Use: No  . Drug Use: No  . Sexual Activity: Not on file   Other Topics Concern  . Not on file   Social History Narrative   Second marriage. Has 3 adult children, adopted 4, 6 grandkids.   From [place], [place]    Past Surgical History  Procedure Laterality Date  . Partial hysterectomy  1989    dyspasia, ovaries left in  . Rotator cuff repair      right 03/2001, left 07/2001  . Carpal tunnel release      right 03/2001, left 07/2001  . Esophagogastroduodenoscopy    . Breast surgery   1989    aspirated lump left breast  . Abdominal exploration surgery      for twisted bowel  . Knee arthroscopy      rt knee  . Nissenfundiplication    . Partial knee arthroplasty Right 06/23/2013    Procedure: RIGHT KNEE MEDIAL UNICOMPARTMENTAL ARTHROPLASTY;  Surgeon: Mauri Pole, MD;  Location: [facility];  Service: Orthopedics;  Laterality: Right;  . Partial knee arthroplasty Left 08/31/2013    Procedure: LEFT UNICOMPARTMENTAL KNEE ATHROPLASTY   (MEDIAL) ;  Surgeon: Mauri Pole, MD;  Location: [facility];  Service: Orthopedics;  Laterality: Left;    Family History  Problem Relation Age of Onset  . Arthritis Mother   . Cancer Mother     lung cancer, dx'd in her 69s  . Cancer Father     throat  . Cancer Sister     breast  . Breast cancer Sister   . Colon cancer Neg Hx   . COPD Brother     Allergies  Allergen Reactions  . Clarithromycin     REACTION: nausea and vomiting  . Erythromycin Ethylsuccinate     REACTION: GI upset  . Fluticasone-Salmeterol  REACTION: Sore tongue and rash  . Procaine Hcl     REACTION: swelling  . Sulfonamide Derivatives     REACTION: rash  . Topiramate     REACTION: confusion    Current Outpatient Prescriptions on File Prior to Visit  Medication Sig Dispense Refill  . alendronate (FOSAMAX) 70 MG tablet Take 1 tablet (70 mg total) by mouth every 7 (seven) days. Take with a full glass of water on an empty stomach. 4 tablet 11  . aspirin 81 MG tablet Take 81 mg by mouth daily.    . chlorpheniramine-HYDROcodone (TUSSIONEX PENNKINETIC ER) 10-8 MG/5ML SUER Take 5 mLs by mouth at bedtime as needed for cough 150 mL 0  . Cholecalciferol (EQL VITAMIN D3) 1000 UNITS tablet Take 2 tablets (2,000 Units total) by mouth daily.    . cyclobenzaprine (FLEXERIL) 10 MG tablet Take 1 tablet (10 mg total) by mouth 3 (three) times daily as needed for muscle spasms. (Patient taking differently: Take 10 mg by mouth at bedtime. ) 30 tablet 0  .  fexofenadine-pseudoephedrine (ALLEGRA-D) 60-120 MG per tablet Take 1 tablet by mouth as needed.    . Glucosamine-Chondroitin (MOVE FREE PO) Take 2 tablets by mouth daily.    . metoprolol succinate (TOPROL-XL) 25 MG 24 hr tablet TAKE 2 TABLETS BY MOUTH EVERY DAY 60 tablet 6  . promethazine (PHENERGAN) 25 MG tablet Take 1 tablet (25 mg total) by mouth 3 (three) times daily as needed for nausea or vomiting. 30 tablet 3   No current facility-administered medications on file prior to visit.    BP 148/84 mmHg  Pulse 136  Temp(Src) 99.1 F (37.3 C) (Oral)  Ht 5\' 2"  (1.575 m)  Wt 177 lb 6.4 oz (80.468 kg)  BMI 32.44 kg/m2  SpO2 99%    Objective:   Physical Exam  Constitutional: She appears well-nourished.  HENT:  Right Ear: Tympanic membrane and ear canal normal.  Left Ear: Tympanic membrane and ear canal normal.  Nose: Right sinus exhibits no maxillary sinus tenderness and no frontal sinus tenderness. Left sinus exhibits no maxillary sinus tenderness and no frontal sinus tenderness.  Mouth/Throat: Oropharynx is clear and moist.  Eyes: Conjunctivae are normal.  Neck: Neck supple.  Cardiovascular: Regular rhythm.   Sinus tachycardia in 120's.  Pulmonary/Chest: She has no decreased breath sounds. She has no wheezes. She has rhonchi in the right upper field, the right lower field, the left upper field and the left lower field.  Lymphadenopathy:    She has no cervical adenopathy.  Skin: Skin is warm and dry.          Assessment & Plan:  Acute Bronchitis:  Cough, fever, body aches, congestion x 2+ weeks. Sinus tachycardia upon exam today. Lungs with rhonchi thorough out. Xray negative for pneumonia. Due to examination and presentation will treat for probable bacterial involvement. RX for Doxycycline and Hycodan provided. Mucinex PRN. Fluids, rest, return precautions provided.

## 2015-02-25 ENCOUNTER — Telehealth: Payer: Self-pay | Admitting: Family Medicine

## 2015-02-25 NOTE — Telephone Encounter (Signed)
Pt called asking for results of chest xray ordered by Anda Kraft on 02/23/15. You can reach her at [phone].

## 2015-02-25 NOTE — Telephone Encounter (Signed)
I sent her a My Chart message on 02/22/14. Please call and notify.  Thanks.

## 2015-02-25 NOTE — Telephone Encounter (Signed)
Called and notified patient of X-ray results and Kate's comments. Patient verbalized understanding.

## 2015-03-17 ENCOUNTER — Other Ambulatory Visit: Payer: Self-pay

## 2015-03-17 NOTE — Telephone Encounter (Signed)
Pt left v/m requesting rx tussionex. Call when ready for pick up. Pt would like to pick up on 03/18/15. Last printed # 150 ml on 02/16/15. Last seen 02/23/2015.

## 2015-03-18 MED ORDER — HYDROCOD POLST-CPM POLST ER 10-8 MG/5ML PO SUER: ORAL | Status: DC

## 2015-03-18 NOTE — Telephone Encounter (Signed)
Patient advised.  Rx left at front desk for pick up. 

## 2015-03-18 NOTE — Telephone Encounter (Signed)
Printed.  Thanks.  

## 2015-03-22 ENCOUNTER — Other Ambulatory Visit: Payer: Self-pay

## 2015-03-22 DIAGNOSIS — Z1231 Encounter for screening mammogram for malignant neoplasm of breast: Secondary | ICD-10-CM

## 2015-04-01 ENCOUNTER — Other Ambulatory Visit: Payer: Self-pay | Admitting: Family Medicine

## 2015-04-08 ENCOUNTER — Encounter: Payer: Self-pay | Admitting: Family Medicine

## 2015-04-08 ENCOUNTER — Ambulatory Visit (INDEPENDENT_AMBULATORY_CARE_PROVIDER_SITE_OTHER): Payer: BLUE CROSS/BLUE SHIELD | Admitting: Family Medicine

## 2015-04-08 VITALS — BP 126/72 | HR 102 | Temp 98.7°F | Wt 178.2 lb

## 2015-04-08 DIAGNOSIS — M545 Low back pain, unspecified: Secondary | ICD-10-CM

## 2015-04-08 MED ORDER — HYDROCOD POLST-CPM POLST ER 10-8 MG/5ML PO SUER: ORAL | Status: DC

## 2015-04-08 NOTE — Patient Instructions (Signed)
I think this was muscle pain, likely from the car ride to/from [place].  I would skip the fosmax this week.  If totally back to normal, then restart it.  If you have more troubles then let me know.  Take care.  Glad to see you.

## 2015-04-08 NOTE — Progress Notes (Signed)
Pre visit review using our clinic review tool, if applicable. No additional management support is needed unless otherwise documented below in the visit note.  Back pain in the mid back, started about 1 week ago, moved into the lower back then the B groin.  No injuries.  No FCNAVD.  No dysuria.  No bowel changes.  Not passing blood.  No rash.  No h/o renal stones.  No trauma.  Pain with walking but not radicular leg pain.  B sx, L side got better as the R side (groin) got more painful.  She didn't know what caused the sx.  She had gone to [place] re: her mother's estate.  She didn't do a lot of lifting then.  Family members had trouble with back pain from fosamax. Today is better than yesterday.   She did have a long car trip right before her sx started.  She was also looking after a grandbaby in the meantime.   Meds, vitals, and allergies reviewed.   ROS: See HPI.  Otherwise, noncontributory.  nad ncat Mmm rrr ctab abd soft, not ttp Normal hip ROM B, SLR leg Back not ttp in midline or lower paraspinal muscles S/S grossly wnl BLE No CVA pain

## 2015-04-11 DIAGNOSIS — M549 Dorsalgia, unspecified: Secondary | ICD-10-CM | POA: Insufficient documentation

## 2015-04-11 NOTE — Assessment & Plan Note (Signed)
Has migrated down from the lower back to the B upper thigh/groin area, w/o radicular component.  After a car drive.  Likely muscle spasms, d/w pt.  I don't think this is from fosamax.  She is already better.  She can skip the next dose of fosamax and then if back to normal can restart after that.  D/w pt about car trips- if prolonged then she'll need to stretch before during and after the trips.  F/u prn.  She agrees.

## 2015-04-13 ENCOUNTER — Ambulatory Visit
Admission: RE | Admit: 2015-04-13 | Discharge: 2015-04-13 | Disposition: A | Discharge status: Home / Self Care | Payer: BLUE CROSS/BLUE SHIELD | Source: Ambulatory Visit

## 2015-04-13 DIAGNOSIS — Z1231 Encounter for screening mammogram for malignant neoplasm of breast: Secondary | ICD-10-CM

## 2015-04-14 ENCOUNTER — Encounter: Payer: Self-pay | Admitting: *Deleted

## 2015-05-13 ENCOUNTER — Other Ambulatory Visit: Payer: Self-pay

## 2015-05-13 NOTE — Telephone Encounter (Signed)
Routed to PCP as I am just now seeing this and it's after hours.

## 2015-05-13 NOTE — Telephone Encounter (Signed)
Pt left v/m requesting rx tussionex. Call when ready for pick up; pt wants to pick up this afternoon. Last seen and printed # 150 ml on 04/08/15. Dr Damita Dunnings out of office.Please advise.

## 2015-05-15 MED ORDER — HYDROCOD POLST-CPM POLST ER 10-8 MG/5ML PO SUER: ORAL | Status: DC

## 2015-05-15 NOTE — Telephone Encounter (Signed)
Printed.  Thanks.  

## 2015-05-15 NOTE — Addendum Note (Signed)
Addended by: Tonia Ghent on: 05/15/2015 02:27 PM   Modules accepted: Orders

## 2015-05-16 NOTE — Telephone Encounter (Signed)
Patient advised.  Rx left at front desk for pick up. 

## 2015-06-10 ENCOUNTER — Telehealth: Payer: Self-pay | Admitting: Family Medicine

## 2015-06-10 NOTE — Telephone Encounter (Signed)
If it is the white of her eye that is red, w/o pain discharge or vision changes, then it is likely a subconjunctival hemorrhage and those are benign.  As long as no pain or vision loss, okay for eval Monday.  Thanks.

## 2015-06-10 NOTE — Telephone Encounter (Signed)
Pt has appt with Dr Damita Dunnings 06/13/15 at 12 noon.

## 2015-06-10 NOTE — Telephone Encounter (Signed)
Patient advised, no pain, vision loss, discharge.

## 2015-06-10 NOTE — Telephone Encounter (Signed)
Patient Name: Alice Smith DOB: [birth year] Initial Comment Caller states just woke up a couple of days ago with part of eye real red. Nurse Assessment Nurse: Marcelline Deist, RN, Lynda Date/Time (Eastern Time): 06/10/2015 12:15:50 PM Confirm and document reason for call. If symptomatic, describe symptoms. You must click the next button to save text entered. ---Caller states just woke up a couple of days ago with part of her left eye real red. No pain, or effect on vision. Has the patient traveled out of the country within the last 30 days? ---Not Applicable Does the patient have any new or worsening symptoms? ---Yes Will a triage be completed? ---Yes Related visit to physician within the last 2 weeks? ---No Does the PT have any chronic conditions? (i.e. diabetes, asthma, etc.) ---Yes List chronic conditions. ---migraines Is this a behavioral health or substance abuse call? ---No Guidelines Guideline Title Affirmed Question Affirmed Notes Eye - Red Without Pus Bleeding on white of the eye Final Disposition User See PCP When Office is Open (within 3 days) Marcelline Deist, RN, Kermit Balo Disagree/Comply: [facility]

## 2015-06-13 ENCOUNTER — Encounter: Payer: Self-pay | Admitting: Family Medicine

## 2015-06-13 ENCOUNTER — Ambulatory Visit (INDEPENDENT_AMBULATORY_CARE_PROVIDER_SITE_OTHER): Payer: BLUE CROSS/BLUE SHIELD | Admitting: Family Medicine

## 2015-06-13 VITALS — BP 126/78 | HR 80 | Temp 98.1°F | Wt 179.5 lb

## 2015-06-13 DIAGNOSIS — H1132 Conjunctival hemorrhage, left eye: Secondary | ICD-10-CM

## 2015-06-13 DIAGNOSIS — R059 Cough, unspecified: Secondary | ICD-10-CM

## 2015-06-13 DIAGNOSIS — R05 Cough: Secondary | ICD-10-CM

## 2015-06-13 MED ORDER — HYDROCOD POLST-CPM POLST ER 10-8 MG/5ML PO SUER: ORAL | Status: DC

## 2015-06-13 NOTE — Assessment & Plan Note (Signed)
Reassured, d/w pt.  Should resolve.  F/u prn. No vision troubles.

## 2015-06-13 NOTE — Patient Instructions (Addendum)
Subconjunctival hemorrhage.  Incidental.  Should resolve.  Update me as needed. Take care.  Glad to see you.

## 2015-06-13 NOTE — Assessment & Plan Note (Signed)
Continue hydrocodone cough syrup, no ADE on med.  rx given to patient.

## 2015-06-13 NOTE — Progress Notes (Signed)
Pre visit review using our clinic review tool, if applicable. No additional management support is needed unless otherwise documented below in the visit note.  L subconjunctival hemorrhage noted last week.  No pain, no vision loss.  No R eye sx.  No FCNAVD.  No other bleeding.  No trauma.  No discharge.    Needs refill on cough med.  No ADE on med.   Meds, vitals, and allergies reviewed.   ROS: See HPI.  Otherwise, noncontributory.  nad ncat Tm wnl PERRL EOMI Conjunctiva wnl except for L lateral subconjunctival hemorrhage noted.   mmm

## 2015-06-14 ENCOUNTER — Telehealth: Payer: Self-pay

## 2015-06-14 NOTE — Telephone Encounter (Signed)
Left detailed message on voicemail to return call with more info.

## 2015-06-14 NOTE — Telephone Encounter (Signed)
Please see what detail you can get.  Thanks.  I don't recall talking about this yesterday.

## 2015-06-14 NOTE — Telephone Encounter (Signed)
Pt left v/m requesting neurology referral to Dr Johnnye Sima  At [facility] ; pt was seen 06/13/15. Unable to reach pt for more details.

## 2015-06-15 NOTE — Telephone Encounter (Signed)
Patient notified as instructed by telephone and verbalized understanding. Appointment scheduled for 06/21/15.

## 2015-06-15 NOTE — Telephone Encounter (Signed)
Spoke to patient and was advised that she has had four surgeries (both knees and feet) in the past few years.  Patient stated that she is not sleeping good at night and forgetting a lot of things recently. Patient stated that she saw the neurologist over 15 years ago. Patient stated that she has been under a lot of stress in the last two years. Patient is wondering if the medication that she is taking may be causing some of this. Patient stated that her daughter has noticed a big change in her memory recently and was upset with her that she did not mention this to you when she saw you.

## 2015-06-15 NOTE — Telephone Encounter (Signed)
Patient returned Alice Smith's call.  Please call patient back at [phone].

## 2015-06-15 NOTE — Telephone Encounter (Signed)
I can refer her, but I think it would be prudent to have OV to discuss her situation, test out her memory and get the basics done.  Then we can refer to neuro if needed/desired.  Thanks.

## 2015-06-21 ENCOUNTER — Encounter: Payer: Self-pay | Admitting: Family Medicine

## 2015-06-21 ENCOUNTER — Ambulatory Visit (INDEPENDENT_AMBULATORY_CARE_PROVIDER_SITE_OTHER): Payer: BLUE CROSS/BLUE SHIELD | Admitting: Family Medicine

## 2015-06-21 VITALS — BP 144/76 | HR 94 | Temp 98.4°F | Wt 177.2 lb

## 2015-06-21 DIAGNOSIS — R413 Other amnesia: Secondary | ICD-10-CM

## 2015-06-21 LAB — CBC WITH DIFFERENTIAL/PLATELET
BASOS ABS: 0 10*3/uL (ref 0.0–0.1)
Basophils Relative: 0.5 % (ref 0.0–3.0)
Eosinophils Absolute: 0.3 10*3/uL (ref 0.0–0.7)
Eosinophils Relative: 4.1 % (ref 0.0–5.0)
HCT: 41.3 % (ref 36.0–46.0)
Hemoglobin: 14.1 g/dL (ref 12.0–15.0)
LYMPHS ABS: 2.4 10*3/uL (ref 0.7–4.0)
Lymphocytes Relative: 30.5 % (ref 12.0–46.0)
MCHC: 34.1 g/dL (ref 30.0–36.0)
MCV: 89.2 fl (ref 78.0–100.0)
Monocytes Absolute: 0.6 10*3/uL (ref 0.1–1.0)
Monocytes Relative: 8.1 % (ref 3.0–12.0)
NEUTROS ABS: 4.4 10*3/uL (ref 1.4–7.7)
NEUTROS PCT: 56.8 % (ref 43.0–77.0)
PLATELETS: 274 10*3/uL (ref 150.0–400.0)
RBC: 4.63 Mil/uL (ref 3.87–5.11)
RDW: 12.9 % (ref 11.5–15.5)
WBC: 7.8 10*3/uL (ref 4.0–10.5)

## 2015-06-21 LAB — COMPREHENSIVE METABOLIC PANEL
ALK PHOS: 88 U/L (ref 39–117)
ALT: 20 U/L (ref 0–35)
AST: 22 U/L (ref 0–37)
Albumin: 4.3 g/dL (ref 3.5–5.2)
BUN: 18 mg/dL (ref 6–23)
CO2: 29 meq/L (ref 19–32)
Calcium: 10.4 mg/dL (ref 8.4–10.5)
Chloride: 105 mEq/L (ref 96–112)
Creatinine, Ser: 0.72 mg/dL (ref 0.40–1.20)
GFR: 85.09 mL/min (ref >60.00)
Glucose, Bld: 115 mg/dL — ABNORMAL HIGH (ref 70–99)
POTASSIUM: 4.5 meq/L (ref 3.5–5.1)
SODIUM: 141 meq/L (ref 135–145)
TOTAL PROTEIN: 7.5 g/dL (ref 6.0–8.3)
Total Bilirubin: 0.5 mg/dL (ref 0.2–1.2)

## 2015-06-21 LAB — VITAMIN B12: Vitamin B-12: 452 pg/mL (ref 211–911)

## 2015-06-21 LAB — TSH: TSH: 4.63 u[IU]/mL — ABNORMAL HIGH (ref 0.35–4.50)

## 2015-06-21 MED ORDER — HYDROCOD POLST-CPM POLST ER 10-8 MG/5ML PO SUER: ORAL | Status: DC

## 2015-06-21 NOTE — Patient Instructions (Signed)
You did well on your memory test today- 29/30.   Go to the lab on the way out.  We'll contact you with your lab report. We can consider the head CT after your labs are back.  Cut the cough medicine in half at night.  Update me as needed.  Take care.  Glad to see you.

## 2015-06-21 NOTE — Progress Notes (Signed)
Pre visit review using our clinic review tool, if applicable. No additional management support is needed unless otherwise documented below in the visit note.  Pt's daughter was concerned and that prompted the OV today, was concerned about her prev mult surgeries, had missed two doctor's appointments at another clinic.  All of this was concerning to patient's daughter "she thinks I'm not responding to thinks as quickly as I used to."    Still on baseline cough medicine, with some relief- that was the only thing that had helped her cough at night- s/p fundoplication and still had cough after that.  Off flexeril and phenergan in the meantime.  D/w pt about possibly cutting dose to see if she could still get some relief.    Sig upheaval, death of family members in last few years.  Mood is "okay".  "I think I'm down but not depressed."    She has had a few memory lapses but no "smoking gun" event to point to true memory loss.    PMH and SH reviewed  ROS: Per HPI unless specifically indicated in ROS section   Meds, vitals, and allergies reviewed.   GEN: nad, alert and oriented HEENT: mucous membranes moist NECK: supple w/o LA CV: rrr.  PULM: ctab, no inc wob ABD: soft, +bs EXT: no edema SKIN: no acute rash CN 2-12 wnl B, S/S/DTR wnl x4 MMSE 29/30, -1 for recall.

## 2015-06-22 DIAGNOSIS — R413 Other amnesia: Secondary | ICD-10-CM | POA: Insufficient documentation

## 2015-06-22 NOTE — Assessment & Plan Note (Signed)
MMSE 29/30, -1 recall.  ddx d/w pt.   Likely/possibly multifactorial with social stressors likely playing a role.  D/w pt.  Check basic labs.  Consider CT after labs done.  Depression can play a role, but she thinks social stressors are playing a role, she denies feeling depressed.   Still okay for outpatient f/u.  She agrees with plan.  >25 minutes spent in face to face time with patient, >50% spent in counselling or coordination of care.

## 2015-07-15 ENCOUNTER — Other Ambulatory Visit: Payer: Self-pay

## 2015-07-15 MED ORDER — HYDROCOD POLST-CPM POLST ER 10-8 MG/5ML PO SUER: ORAL | Status: DC

## 2015-07-15 NOTE — Telephone Encounter (Signed)
Pt left v/m requesting rx tussionex. Call when ready for pick up. Pt last seen and rx last printed # 150 ml on 06/21/15. Pt request to pick up on 07/15/15 due to holiday.

## 2015-07-15 NOTE — Telephone Encounter (Signed)
Spoke to pt. Rx up front for pickup 

## 2015-07-15 NOTE — Telephone Encounter (Signed)
Printed.  Thanks.  

## 2015-07-25 ENCOUNTER — Telehealth: Payer: Self-pay | Admitting: Family Medicine

## 2015-07-25 NOTE — Telephone Encounter (Signed)
Will see tomorrow. Thanks.  

## 2015-07-25 NOTE — Telephone Encounter (Signed)
Patient Name: Alice Smith  DOB: 1945/07/26    Initial Comment Caller states she has left arm tingling and muscle tightness in upper arm    Nurse Assessment  Nurse: Leilani Merl, RN, Heather Date/Time (Eastern Time): 07/25/2015 10:49:41 AM  Confirm and document reason for call. If symptomatic, describe symptoms. You must click the next button to save text entered. ---Caller states she has left arm tingling and muscle tightness in upper arm. This started on Friday afternoon. It comes and goes  Has the patient traveled out of the country within the last 30 days? ---Not Applicable  Does the patient have any new or worsening symptoms? ---Yes  Will a triage be completed? ---Yes  Related visit to physician within the last 2 weeks? ---No  Does the PT have any chronic conditions? (i.e. diabetes, asthma, etc.) ---Yes  List chronic conditions. ---See MR  Is this a behavioral health or substance abuse call? ---No     Guidelines    Guideline Title Affirmed Question Affirmed Notes  Arm Pain Numbness (i.e., loss of sensation) in hand or fingers    Final Disposition User   See Physician within 24 Hours Standifer, RN, Water quality scientist    Comments  Appt with Dr. Damita Dunnings tomorrow at 9;30 am.   Referrals  REFERRED TO PCP OFFICE   Disagree/Comply: Comply

## 2015-07-25 NOTE — Telephone Encounter (Signed)
Pt has appt on 07/26/15 with Dr Damita Dunnings.

## 2015-07-26 ENCOUNTER — Encounter: Payer: Self-pay | Admitting: Family Medicine

## 2015-07-26 ENCOUNTER — Ambulatory Visit (INDEPENDENT_AMBULATORY_CARE_PROVIDER_SITE_OTHER): Payer: BLUE CROSS/BLUE SHIELD | Admitting: Family Medicine

## 2015-07-26 VITALS — BP 124/72 | HR 80 | Temp 98.4°F | Wt 174.8 lb

## 2015-07-26 DIAGNOSIS — M79602 Pain in left arm: Secondary | ICD-10-CM

## 2015-07-26 DIAGNOSIS — M79622 Pain in left upper arm: Secondary | ICD-10-CM | POA: Diagnosis not present

## 2015-07-26 DIAGNOSIS — M25522 Pain in left elbow: Secondary | ICD-10-CM

## 2015-07-26 NOTE — Progress Notes (Signed)
Pre visit review using our clinic review tool, if applicable. No additional management support is needed unless otherwise documented below in the visit note.  L arm sx.  She has some cramping the upper arm and the some tingling down the L arm.  Started about 5 days ago.  Intermittent.  No R sided sx.  No leg sx.  No chest sx.  Happens multiple times per day, ~5-10 times per day, last a few minutes.  Strength is still wnl.  Tingling only on the flexor side, when present, not on extensor side.  No cause for the sx identified by patient unless she strained a muscle.  No falls, no trauma.  No bruising.  No FCNAVD.  She feels well o/w.  She rubs it when it happens, with some relief. No neck or back pain.   Meds, vitals, and allergies reviewed.   ROS: Per HPI unless specifically indicated in ROS section   nad ncat Possible L deltoid strain- ttp at the deltoid insertion.  No bruising.  L rotator cuff testing wnl, w/o pain on int/ext rotation.   Neck ROM wnl, not ttp Back not ttp

## 2015-07-26 NOTE — Patient Instructions (Signed)
Likely deltoid strain.  Ice the area, use flexeril 5mg  if needed (sedation caution), gently range of motion and limit lifting otherwise.  Take care.  Glad to see you.  Should gradually resolve.

## 2015-07-27 DIAGNOSIS — M25529 Pain in unspecified elbow: Secondary | ICD-10-CM | POA: Insufficient documentation

## 2015-07-27 DIAGNOSIS — M79602 Pain in left arm: Secondary | ICD-10-CM | POA: Insufficient documentation

## 2015-07-27 NOTE — Assessment & Plan Note (Signed)
Dw pt. Likely deltoid strain.  Ice the area, use flexeril 5mg  if needed (sedation caution), gently range of motion and limit lifting otherwise.  She could have peripheral nerve irritation.  She doesn't appear to have sx origination from the neck.  D/w pt.   Okay for outpatient f/u.  No weakness on BUE on exam.

## 2015-08-25 ENCOUNTER — Other Ambulatory Visit: Payer: Self-pay

## 2015-08-25 NOTE — Telephone Encounter (Signed)
Pt left v/m requesting rx tussionex; call when ready for pick up. Last printed 150 ml on 07/15/15 ; last acute visit 07/26/15.

## 2015-08-26 MED ORDER — HYDROCOD POLST-CPM POLST ER 10-8 MG/5ML PO SUER: ORAL | Status: DC

## 2015-08-26 NOTE — Telephone Encounter (Signed)
Printed.  Thanks.  

## 2015-08-26 NOTE — Telephone Encounter (Signed)
Patient advised.  Rx left at front desk for pick up. 

## 2015-09-13 ENCOUNTER — Other Ambulatory Visit: Payer: Self-pay

## 2015-09-13 MED ORDER — METOPROLOL TARTRATE 25 MG PO TABS: 25.0000 mg | ORAL_TABLET | Freq: Two times a day (BID) | ORAL | 3 refills | Status: DC

## 2015-09-13 MED ORDER — HYDROCOD POLST-CPM POLST ER 10-8 MG/5ML PO SUER: ORAL | 0 refills | Status: DC

## 2015-09-13 NOTE — Telephone Encounter (Signed)
Pt left v/m requesting rx for tussionex and to go back to original instructions; taking 1/2 dose is not effective. Pt request to pick up rx on 09/14/15. Last printed # 150 ml on 08/26/15. Last seen for cough 02/23/15.Please advise.

## 2015-09-13 NOTE — Telephone Encounter (Signed)
It was printed to be filled on/after 08/26/15 for 151mL.  At 53mL per day, she still shouldn't run out until 09/24/15.  This is too early to fill.  I didn't print the rx.  We can change the sig (and I did), but it shouldn't be filled now.  Thanks.

## 2015-09-14 ENCOUNTER — Other Ambulatory Visit: Payer: Self-pay | Admitting: Family Medicine

## 2015-09-14 ENCOUNTER — Other Ambulatory Visit (INDEPENDENT_AMBULATORY_CARE_PROVIDER_SITE_OTHER): Payer: BLUE CROSS/BLUE SHIELD

## 2015-09-14 DIAGNOSIS — M810 Age-related osteoporosis without current pathological fracture: Secondary | ICD-10-CM | POA: Diagnosis not present

## 2015-09-14 DIAGNOSIS — E785 Hyperlipidemia, unspecified: Secondary | ICD-10-CM

## 2015-09-14 LAB — COMPREHENSIVE METABOLIC PANEL
ALBUMIN: 4.2 g/dL (ref 3.5–5.2)
ALT: 19 U/L (ref 0–35)
AST: 19 U/L (ref 0–37)
Alkaline Phosphatase: 79 U/L (ref 39–117)
BILIRUBIN TOTAL: 0.6 mg/dL (ref 0.2–1.2)
BUN: 20 mg/dL (ref 6–23)
CALCIUM: 9.9 mg/dL (ref 8.4–10.5)
CO2: 28 meq/L (ref 19–32)
CREATININE: 0.77 mg/dL (ref 0.40–1.20)
Chloride: 106 mEq/L (ref 96–112)
GFR: 78.69 mL/min (ref >60.00)
Glucose, Bld: 104 mg/dL — ABNORMAL HIGH (ref 70–99)
Potassium: 4.6 mEq/L (ref 3.5–5.1)
SODIUM: 141 meq/L (ref 135–145)
Total Protein: 7.4 g/dL (ref 6.0–8.3)

## 2015-09-14 LAB — LIPID PANEL
Cholesterol: 192 mg/dL (ref 0–200)
HDL: 55.1 mg/dL (ref >39.00)
LDL Cholesterol: 114 mg/dL — ABNORMAL HIGH (ref 0–99)
NonHDL: 137.17
TRIGLYCERIDES: 116 mg/dL (ref 0.0–149.0)
Total CHOL/HDL Ratio: 3
VLDL: 23.2 mg/dL (ref 0.0–40.0)

## 2015-09-14 LAB — VITAMIN D 25 HYDROXY (VIT D DEFICIENCY, FRACTURES): VITD: 55.49 ng/mL (ref 30.00–100.00)

## 2015-09-14 NOTE — Telephone Encounter (Signed)
Patient came to the office and advised her as instructed. Patient stated that she had tried to cut back on the Tussionex and it did not work. Patient still has the script that was to be filled on/after 08/26/15. Advised patient to take it to the pharmacy and they should fill it for her. Patient stated that she will get the script filled and will discuss this with Dr. Damita Dunnings at her upcoming appointment.

## 2015-09-14 NOTE — Telephone Encounter (Signed)
Thanks

## 2015-09-15 ENCOUNTER — Telehealth: Payer: Self-pay

## 2015-09-15 NOTE — Telephone Encounter (Signed)
Mr Rapozo left note; concerned about pt behavior and communication skills; sometimes pt loses an answer to a question or has to be asked over and over the same thing. Concerned pt is taking bedtime cough med too often; pt takes med for migraines but does not need med; and takes a muscle relaxant and Mr Hewgley questions why pt is taking that med. Mr Zuloaga is willing for pt to have referral to neurologist if needed. Pt has CPX scheduled 09/20/15. Mr Columbus request cb.

## 2015-09-15 NOTE — Telephone Encounter (Signed)
I called pt and wife.  She gave me permission to talk with husband.   He was worried about her memory and if her meds could contribute to memory changes.  He hadn't talked to patient about it.  I asked him for permission to talk with her about my conversation with him.  He consented.   D/w pt about the above.  We'll check her memory and meds at [facility] as routine care.   We'll go from there.   She agrees.  She feels well and is apparently okay for outpatient f/u.  She had normal speech/conversation on the phone with me.   She mentioned that the lower dose of cough medicine didn't help and she was still coughing a lot at night with the 1/2 dose.  We'll discuss at [place].   She agrees.

## 2015-09-20 ENCOUNTER — Ambulatory Visit (INDEPENDENT_AMBULATORY_CARE_PROVIDER_SITE_OTHER): Payer: BLUE CROSS/BLUE SHIELD | Admitting: Family Medicine

## 2015-09-20 ENCOUNTER — Encounter: Payer: Self-pay | Admitting: Family Medicine

## 2015-09-20 VITALS — BP 122/86 | HR 92 | Temp 98.1°F | Wt 174.0 lb

## 2015-09-20 DIAGNOSIS — Z0000 Encounter for general adult medical examination without abnormal findings: Secondary | ICD-10-CM | POA: Diagnosis not present

## 2015-09-20 DIAGNOSIS — G43009 Migraine without aura, not intractable, without status migrainosus: Secondary | ICD-10-CM

## 2015-09-20 DIAGNOSIS — R413 Other amnesia: Secondary | ICD-10-CM | POA: Diagnosis not present

## 2015-09-20 DIAGNOSIS — R05 Cough: Secondary | ICD-10-CM

## 2015-09-20 DIAGNOSIS — R059 Cough, unspecified: Secondary | ICD-10-CM

## 2015-09-20 MED ORDER — VITAMIN D 1000 UNITS PO TABS: 1000.0000 [IU] | ORAL_TABLET | ORAL | Status: DC

## 2015-09-20 MED ORDER — ALENDRONATE SODIUM 70 MG PO TABS: 70.0000 mg | ORAL_TABLET | ORAL | 3 refills | Status: DC

## 2015-09-20 MED ORDER — METOPROLOL SUCCINATE ER 50 MG PO TB24: 50.0000 mg | ORAL_TABLET | Freq: Every day | ORAL | 3 refills | Status: DC

## 2015-09-20 NOTE — Progress Notes (Signed)
Pre visit review using our clinic review tool, if applicable. No additional management support is needed unless otherwise documented below in the visit note. 

## 2015-09-20 NOTE — Patient Instructions (Addendum)
Your memory testing was normal today.  If you notice troubles or changes in the meantime then let me know.  Cut your vitamin D in half.  If previously on 1000 units a day, then take it every other day.   Take care.  Glad to see you.  Update me as needed.

## 2015-09-21 NOTE — Progress Notes (Signed)
I have personally reviewed the Medicare Annual Wellness questionnaire and have noted 1. The patient's medical and social history 2. Their use of alcohol, tobacco or illicit drugs 3. Their current medications and supplements 4. The patient's functional ability including ADL's, fall risks, home safety risks and hearing or visual             impairment. 5. Diet and physical activities 6. Evidence for depression or mood disorders  The patients weight, height, BMI have been recorded in the chart and visual acuity is per eye clinic.  I have made referrals, counseling and provided education to the patient based review of the above and I have provided the pt with a written personalized care plan for preventive services.  Provider list updated- see scanned forms.  Routine anticipatory guidance given to patient.  See health maintenance.  Flu encouraged Shingles 2008 PNA up to date Tetanus 2016 Colonoscopy 2009 Breast cancer screening 2017 DXA 2016 Advance directive- if patient incapacitated, husband designated (then her child [name] designated if husband unable to act).   Cognitive function addressed- see scanned forms- and if abnormal then additional documentation follows.   Family was concerned about memory, patient didn't have concerns.  Normal testing today at [place].  Mult social stressors noted, d/w pt, unclear if contributing to occ memory lapses.  At this point, no clear sign of true pathology.  D/w pt.   Cough.  S/p nissen and pulmonary eval, with on med that helped being hydrocodone.  D/w pt.  No ADE on med.  Failed lower dose, does better with 5mg  qhs.  No abuse, etc.    L arm sx some better from prev, less tingling and pain.  No weakness.   Migraine frequency much lower on BB, w/o ADE on med.  PMH and SH reviewed  Meds, vitals, and allergies reviewed.   ROS: Per HPI.  Unless specifically indicated otherwise in HPI, the patient denies:  General: fever. Eyes: acute vision  changes ENT: sore throat Cardiovascular: chest pain Respiratory: SOB GI: vomiting GU: dysuria Musculoskeletal: acute back pain Derm: acute rash Neuro: acute motor dysfunction Psych: worsening mood Endocrine: polydipsia Heme: bleeding Allergy: hayfever  GEN: nad, alert and oriented HEENT: mucous membranes moist NECK: supple w/o LA CV: rrr. PULM: ctab, no inc wob ABD: soft, +bs EXT: no edema SKIN: no acute rash

## 2015-09-21 NOTE — Assessment & Plan Note (Signed)
Flu encouraged Shingles 2008 PNA up to date Tetanus 2016 Colonoscopy 2009 Breast cancer screening 2017 DXA 2016 Advance directive- if patient incapacitated, husband designated (then her child [name] designated if husband unable to act).   Cognitive function addressed- see scanned forms- and if abnormal then additional documentation follows.  See above, normal testing.  She'll udpate me as needed.

## 2015-09-21 NOTE — Assessment & Plan Note (Signed)
Family was concerned about memory, patient didn't have concerns.  Normal testing today at [place].  Mult social stressors noted, d/w pt, unclear if contributing to occ memory lapses.  At this point, no clear sign of true pathology.  D/w pt. She'll udpate me as needed.

## 2015-09-21 NOTE — Assessment & Plan Note (Signed)
Continue as is.  D/ wpt.  S/p nissen and pulmonary eval, with on med that helped being hydrocodone.  D/w pt.  No ADE on med.  Failed lower dose, does better with 5mg  qhs.  No abuse, etc.

## 2015-09-21 NOTE — Assessment & Plan Note (Signed)
Migraine frequency much lower on BB, w/o ADE on med. Continue as is.

## 2015-10-10 ENCOUNTER — Other Ambulatory Visit: Payer: Self-pay

## 2015-10-10 MED ORDER — HYDROCOD POLST-CPM POLST ER 10-8 MG/5ML PO SUER: ORAL | 0 refills | Status: DC

## 2015-10-10 NOTE — Telephone Encounter (Signed)
Pt left v/m requesting refill tussionex. Call when ready for pick up. rx last printed # 150 ml 08/26/15; last time on med list 09/13/15 but no print for that rx. Last annual 09/20/15.

## 2015-10-10 NOTE — Telephone Encounter (Signed)
Printed

## 2015-10-11 NOTE — Telephone Encounter (Signed)
Left detailed message on voicemail. Rx left at front desk for pick up.  

## 2015-11-15 ENCOUNTER — Other Ambulatory Visit: Payer: Self-pay

## 2015-11-15 NOTE — Telephone Encounter (Signed)
Pt left v/m requesting rx tussionex. Call when ready for pick up. Last printed # 150 ml on 10/10/15. Last annual 09/20/2015.

## 2015-11-16 MED ORDER — HYDROCOD POLST-CPM POLST ER 10-8 MG/5ML PO SUER: ORAL | 0 refills | Status: DC

## 2015-11-16 NOTE — Telephone Encounter (Signed)
Printed.  Thanks.  

## 2015-11-16 NOTE — Telephone Encounter (Signed)
Patient notified by telephone that script is up front ready for pickup. 

## 2015-12-20 ENCOUNTER — Other Ambulatory Visit: Payer: Self-pay | Admitting: *Deleted

## 2015-12-20 NOTE — Telephone Encounter (Signed)
Patient called requesting a refill on her cough medication. Last refill 11/16/15 150 ml Last office visit 09/20/15 Call when script ready for pickup

## 2015-12-21 MED ORDER — HYDROCOD POLST-CPM POLST ER 10-8 MG/5ML PO SUER: ORAL | 0 refills | Status: DC

## 2015-12-21 NOTE — Telephone Encounter (Signed)
Printed.  Thanks.  

## 2015-12-21 NOTE — Telephone Encounter (Signed)
Patient notified by telephone that script is up front ready for pickup. 

## 2015-12-30 ENCOUNTER — Encounter: Payer: Self-pay | Admitting: Family Medicine

## 2015-12-30 ENCOUNTER — Ambulatory Visit (INDEPENDENT_AMBULATORY_CARE_PROVIDER_SITE_OTHER): Payer: BLUE CROSS/BLUE SHIELD | Admitting: Family Medicine

## 2015-12-30 DIAGNOSIS — J069 Acute upper respiratory infection, unspecified: Secondary | ICD-10-CM | POA: Diagnosis not present

## 2015-12-30 DIAGNOSIS — G43009 Migraine without aura, not intractable, without status migrainosus: Secondary | ICD-10-CM

## 2015-12-30 DIAGNOSIS — R05 Cough: Secondary | ICD-10-CM

## 2015-12-30 DIAGNOSIS — R059 Cough, unspecified: Secondary | ICD-10-CM

## 2015-12-30 DIAGNOSIS — H9203 Otalgia, bilateral: Secondary | ICD-10-CM

## 2015-12-30 MED ORDER — CYCLOBENZAPRINE HCL 10 MG PO TABS: 5.0000 mg | ORAL_TABLET | Freq: Three times a day (TID) | ORAL | 0 refills | Status: DC | PRN

## 2015-12-30 MED ORDER — PREDNISONE 20 MG PO TABS: 20.0000 mg | ORAL_TABLET | Freq: Every day | ORAL | 0 refills | Status: DC

## 2015-12-30 MED ORDER — PROMETHAZINE HCL 25 MG PO TABS: 25.0000 mg | ORAL_TABLET | Freq: Four times a day (QID) | ORAL | 0 refills | Status: DC | PRN

## 2015-12-30 MED ORDER — DOXYCYCLINE HYCLATE 100 MG PO CAPS: 100.0000 mg | ORAL_CAPSULE | Freq: Two times a day (BID) | ORAL | 0 refills | Status: DC

## 2015-12-30 NOTE — Patient Instructions (Signed)

## 2015-12-30 NOTE — Progress Notes (Signed)
Pre visit review using our clinic review tool, if applicable. No additional management support is needed unless otherwise documented below in the visit note. 

## 2015-12-30 NOTE — Progress Notes (Signed)
Subjective:    Patient ID: Alice Smith, female    DOB: 10-20-1945, 70 y.o.   MRN: [id number]  HPI This is a 70 yo female who presents today with 3 days of cough. Has had bilateral ear pain, little sore throat, little nasal drainage. Feels congested in her chest, wheezing at night, mild SOB. Has been taking Allegra D without relief. No inhaler use. Little relief of cough with Tussionex. She watches her 1.5 yo granddaughter who has been ill.   She requests refill of phenergan and flexeril that she takes for her migraines which have significantly decreased in frequency.  Past Medical History:  Diagnosis Date  . Arthritis   . Breast mass 05/2001   benign  . Complication of anesthesia    slow to wake up x 1  . Dry cough    at night  . Hemorrhoids   . Migraine    evaluated with Dr. Domingo Cocking 11/2009  . Osteoporosis    Past Surgical History:  Procedure Laterality Date  . ABDOMINAL EXPLORATION SURGERY     for twisted bowel  . ABDOMINAL HYSTERECTOMY    . BREAST SURGERY  1989   aspirated lump left breast  . CARPAL TUNNEL RELEASE     right 03/2001, left 07/2001  . ESOPHAGOGASTRODUODENOSCOPY    . KNEE ARTHROSCOPY     rt knee  . nissenfundiplication    . PARTIAL HYSTERECTOMY  1989   dyspasia, ovaries left in  . PARTIAL KNEE ARTHROPLASTY Right 06/23/2013   Procedure: RIGHT KNEE MEDIAL UNICOMPARTMENTAL ARTHROPLASTY;  Surgeon: Mauri Pole, MD;  Location: [facility];  Service: Orthopedics;  Laterality: Right;  . PARTIAL KNEE ARTHROPLASTY Left 08/31/2013   Procedure: LEFT UNICOMPARTMENTAL KNEE ATHROPLASTY   (MEDIAL) ;  Surgeon: Mauri Pole, MD;  Location: [facility];  Service: Orthopedics;  Laterality: Left;  . ROTATOR CUFF REPAIR     right 03/2001, left 07/2001   Family History  Problem Relation Age of Onset  . Arthritis Mother   . Cancer Mother     lung cancer, dx'd in her 44s  . Cancer Father     throat  . COPD Brother   . Cancer Sister     breast  . Breast cancer Sister   . Colon  cancer Neg Hx    Social History  Substance Use Topics  . Smoking status: Former Smoker    Packs/day: 1.00    Years: 5.00    Types: Cigarettes    Quit date: 06/17/1978  . Smokeless tobacco: Never Used     Comment: 5 pack year history  . Alcohol use No      Review of Systems Per HPI    Objective:   Physical Exam  Constitutional: She is oriented to person, place, and time. She appears well-developed and well-nourished. She appears ill.  HENT:  Head: Normocephalic and atraumatic.  Right Ear: Tympanic membrane, external ear and ear canal normal.  Left Ear: Tympanic membrane, external ear and ear canal normal.  Nose: Mucosal edema and rhinorrhea present.  Mouth/Throat: Uvula is midline and mucous membranes are normal. Posterior oropharyngeal erythema present. No oropharyngeal exudate or posterior oropharyngeal edema.  Sounds congested, frequent throat clearing, moderate amount post nasal drainage  Eyes: Conjunctivae are normal.  Cardiovascular: Regular rhythm and normal heart sounds.  Tachycardia present.   Pulmonary/Chest: Effort normal.  Bilateral bases with scattered expiratory rhonchi  Neurological: She is alert and oriented to person, place, and time.  Skin: Skin is warm and  dry.  Psychiatric: She has a normal mood and affect. Her behavior is normal. Judgment and thought content normal.  Vitals reviewed.     BP 110/74 (BP Location: Left Arm, Patient Position: Sitting, Cuff Size: Normal)   Pulse (!) 103   Temp 99.4 F (37.4 C) (Oral)   Resp 12   Wt 164 lb (74.4 kg)   SpO2 98%   BMI 30.00 kg/m      Assessment & Plan:  1. Upper respiratory infection with cough and congestion - Provided written and verbal information regarding diagnosis and treatment. - doxycycline (VIBRAMYCIN) 100 MG capsule; Take 1 capsule (100 mg total) by mouth 2 (two) times daily.  Dispense: 14 capsule; Refill: 0 - predniSONE (DELTASONE) 20 MG tablet; Take 1 tablet (20 mg total) by mouth daily  with breakfast.  Dispense: 5 tablet; Refill: 0  2. Cough - doxycycline (VIBRAMYCIN) 100 MG capsule; Take 1 capsule (100 mg total) by mouth 2 (two) times daily.  Dispense: 14 capsule; Refill: 0 - predniSONE (DELTASONE) 20 MG tablet; Take 1 tablet (20 mg total) by mouth daily with breakfast.  Dispense: 5 tablet; Refill: 0  3. Otalgia of both ears - otc analgesics  4. Migraine without aura and without status migrainosus, not intractable - promethazine (PHENERGAN) 25 MG tablet; Take 1 tablet (25 mg total) by mouth every 6 (six) hours as needed for nausea or vomiting.  Dispense: 30 tablet; Refill: 0 - cyclobenzaprine (FLEXERIL) 10 MG tablet; Take 0.5 tablets (5 mg total) by mouth 3 (three) times daily as needed for muscle spasms.  Dispense: 30 tablet; Refill: 0   Clarene Reamer, FNP-BC  [facility]  12/30/2015 10:59 AM

## 2016-01-10 ENCOUNTER — Encounter: Payer: Self-pay | Admitting: Primary Care

## 2016-01-10 ENCOUNTER — Ambulatory Visit (INDEPENDENT_AMBULATORY_CARE_PROVIDER_SITE_OTHER): Payer: BLUE CROSS/BLUE SHIELD | Admitting: Primary Care

## 2016-01-10 VITALS — BP 134/86 | HR 79 | Temp 98.5°F | Wt 163.8 lb

## 2016-01-10 DIAGNOSIS — J3489 Other specified disorders of nose and nasal sinuses: Secondary | ICD-10-CM | POA: Diagnosis not present

## 2016-01-10 NOTE — Progress Notes (Signed)
Subjective:    Patient ID: Alice Smith, female    DOB: 16-Dec-1945, 70 y.o.   MRN: [id number]  HPI  Alice Smith is a 70 year old female with a history of allergic rhinitis and URI who presents today with a chief complaint of cough. She also reports chest congestion, nasal congestion, headache. She was evaluated on 12/30/15 with a 3 day history of cough, chest congestion, sore throat, and ear pain. She was diagnosed with URI and treated with doxycycline 100 mg seven day course and prednisone burst.   Since her last visit she's completed her antibiotics and is overall feeling better. She's noticed a "rattle" in her chest with sinus pressure mostly since treatment. She can feel the sinus pressure which is very uncomfortable when she bends forward. She's taken Allegra-D and Afrin nasal spray for the last week without much improvement. She denies fevers, shortness of breath, sick contacts. Her cough is non productive.  Review of Systems  Constitutional: Negative for chills, fatigue and fever.  HENT: Positive for congestion, postnasal drip and sinus pressure.   Respiratory: Positive for cough. Negative for shortness of breath.   Cardiovascular: Negative for chest pain.  Neurological: Positive for headaches.       Past Medical History:  Diagnosis Date  . Arthritis   . Breast mass 05/2001   benign  . Complication of anesthesia    slow to wake up x 1  . Dry cough    at night  . Hemorrhoids   . Migraine    evaluated with Dr. Domingo Cocking 11/2009  . Osteoporosis      Social History   Social History  . Marital status: Married    Spouse name: N/A  . Number of children: 3  . Years of education: N/A   Occupational History  . home    Social History Main Topics  . Smoking status: Former Smoker    Packs/day: 1.00    Years: 5.00    Types: Cigarettes    Quit date: 06/17/1978  . Smokeless tobacco: Never Used     Comment: 5 pack year history  . Alcohol use No  . Drug use: No  . Sexual  activity: Not on file   Other Topics Concern  . Not on file   Social History Narrative   Second marriage, 32. Has 3 adult children, adopted 4, 6 grandkids.   From [place], [place]    Past Surgical History:  Procedure Laterality Date  . ABDOMINAL EXPLORATION SURGERY     for twisted bowel  . ABDOMINAL HYSTERECTOMY    . BREAST SURGERY  1989   aspirated lump left breast  . CARPAL TUNNEL RELEASE     right 03/2001, left 07/2001  . ESOPHAGOGASTRODUODENOSCOPY    . KNEE ARTHROSCOPY     rt knee  . nissenfundiplication    . PARTIAL HYSTERECTOMY  1989   dyspasia, ovaries left in  . PARTIAL KNEE ARTHROPLASTY Right 06/23/2013   Procedure: RIGHT KNEE MEDIAL UNICOMPARTMENTAL ARTHROPLASTY;  Surgeon: Mauri Pole, MD;  Location: [facility];  Service: Orthopedics;  Laterality: Right;  . PARTIAL KNEE ARTHROPLASTY Left 08/31/2013   Procedure: LEFT UNICOMPARTMENTAL KNEE ATHROPLASTY   (MEDIAL) ;  Surgeon: Mauri Pole, MD;  Location: [facility];  Service: Orthopedics;  Laterality: Left;  . ROTATOR CUFF REPAIR     right 03/2001, left 07/2001    Family History  Problem Relation Age of Onset  . Arthritis Mother   . Cancer Mother  lung cancer, dx'd in her 23s  . Cancer Father     throat  . COPD Brother   . Cancer Sister     breast  . Breast cancer Sister   . Colon cancer Neg Hx     Allergies  Allergen Reactions  . Clarithromycin     REACTION: nausea and vomiting  . Erythromycin Ethylsuccinate     REACTION: GI upset  . Fluticasone-Salmeterol     REACTION: Sore tongue and rash  . Procaine Hcl     REACTION: swelling  . Sulfonamide Derivatives     REACTION: rash  . Topiramate     REACTION: confusion    Current Outpatient Prescriptions on File Prior to Visit  Medication Sig Dispense Refill  . alendronate (FOSAMAX) 70 MG tablet Take 1 tablet (70 mg total) by mouth every 7 (seven) days. Take with a full glass of water on an empty stomach. 13 tablet 3  . aspirin 81 MG tablet Take 81 mg by  mouth daily.    . cholecalciferol (VITAMIN D) 1000 units tablet Take 1 tablet (1,000 Units total) by mouth every other day.    . cyclobenzaprine (FLEXERIL) 10 MG tablet Take 0.5 tablets (5 mg total) by mouth 3 (three) times daily as needed for muscle spasms. 30 tablet 0  . fexofenadine-pseudoephedrine (ALLEGRA-D) 60-120 MG per tablet Take 1 tablet by mouth as needed.    . Lactobacillus-Inulin ([place] PO) Take by mouth daily.    . metoprolol succinate (TOPROL-XL) 50 MG 24 hr tablet Take 1 tablet (50 mg total) by mouth daily. 90 tablet 3  . Multiple Vitamin (MULTIVITAMIN) capsule Take 1 capsule by mouth daily.    . NON FORMULARY Mega Red daily.    . SUPER B COMPLEX/C PO Take by mouth daily.    . chlorpheniramine-HYDROcodone (TUSSIONEX PENNKINETIC ER) 10-8 MG/5ML SUER Take 5 mLs by mouth at bedtime as needed for cough (Patient not taking: Reported on 01/10/2016) 150 mL 0  . promethazine (PHENERGAN) 25 MG tablet Take 1 tablet (25 mg total) by mouth every 6 (six) hours as needed for nausea or vomiting. (Patient not taking: Reported on 01/10/2016) 30 tablet 0   No current facility-administered medications on file prior to visit.     BP 134/86   Pulse 79   Temp 98.5 F (36.9 C) (Oral)   Wt 163 lb 12.8 oz (74.3 kg)   SpO2 96%   BMI 29.96 kg/m    Objective:   Physical Exam  Constitutional: She appears well-nourished. She does not appear ill.  HENT:  Right Ear: Tympanic membrane and ear canal normal.  Left Ear: Tympanic membrane and ear canal normal.  Nose: Mucosal edema present. Right sinus exhibits no maxillary sinus tenderness and no frontal sinus tenderness. Left sinus exhibits no maxillary sinus tenderness and no frontal sinus tenderness.  Mouth/Throat: Oropharynx is clear and moist.  Eyes: Conjunctivae are normal.  Neck: Neck supple.  Cardiovascular: Normal rate and regular rhythm.   Pulmonary/Chest: Effort normal and breath sounds normal. She has no wheezes. She  has no rales.  Lymphadenopathy:    She has no cervical adenopathy.  Skin: Skin is warm and dry.          Assessment & Plan:  Sinus Pressure:  Also with chest congestion several days after completion of antibiotics. Some improvement with OTC treatment. Exam today with clear lugs, stable, vitals, does not appear acutely ill. Suspect this to be viral in nature or post bacterial response.  It is very unlikely for her to have continued bacterial involvement given treatment with Doxycycline. Discussed to refrain from use of Allegra-D and Afrin. Will have her start Flonase and plain Allegra. Discussed fluids, rest. Follow up PRN.  Sheral Flow, NP

## 2016-01-10 NOTE — Patient Instructions (Signed)
Your symptoms and examination do not represent a bacterial infection.  Cough/Congestion: Try taking Mucinex DM. This will help loosen up the mucous in your chest. Ensure you take this medication with a full glass of water. For your night cough you could try Nyquil at bedtime.  Nasal Congestion/Sinus Pressure: Try using Flonase (fluticasone) nasal spray. Instill 1 spray in each nostril twice daily.   Throat Drainage, Runny Nose: Zyrtec at bedtime. This will help to dry up any residual drainage.  Ensure you are staying hydrated with water and rest.  It was a pleasure to see you today!

## 2016-01-10 NOTE — Progress Notes (Signed)
Pre visit review using our clinic review tool, if applicable. No additional management support is needed unless otherwise documented below in the visit note. 

## 2016-01-19 ENCOUNTER — Other Ambulatory Visit: Payer: Self-pay

## 2016-01-19 NOTE — Telephone Encounter (Signed)
Pt left v/m requesting rx tussionex. Call when ready for pick up. rx last printed # 150 ml on 12/21/15. Last seen annual 09/20/15 and last acute visit 12/30/15.

## 2016-01-20 MED ORDER — HYDROCOD POLST-CPM POLST ER 10-8 MG/5ML PO SUER: ORAL | 0 refills | Status: DC

## 2016-01-20 NOTE — Telephone Encounter (Signed)
Printed.  Thanks.  

## 2016-01-20 NOTE — Telephone Encounter (Signed)
Patient advised.  Rx left at front desk for pick up. 

## 2016-02-22 ENCOUNTER — Other Ambulatory Visit: Payer: Self-pay

## 2016-02-22 DIAGNOSIS — Z79891 Long term (current) use of opiate analgesic: Secondary | ICD-10-CM

## 2016-02-22 MED ORDER — HYDROCOD POLST-CPM POLST ER 10-8 MG/5ML PO SUER: ORAL | 0 refills | Status: DC

## 2016-02-22 NOTE — Telephone Encounter (Signed)
Pt request rx tussionex. Call when ready for pick up. Pt last seen annual 09/20/15; last refilled # 150 ml on 01/20/16.

## 2016-02-22 NOTE — Telephone Encounter (Signed)
Patient notified by telephone that script is up front ready for pickup. 

## 2016-02-22 NOTE — Telephone Encounter (Signed)
Printed.  Thanks.  Indication for chronic opioid:  Refractory cough Medication and dose: tussionex 10/8mg /54mL 170mL per month Last UDS date: 10/30/13 Pain contract signed (Y/N):  yes Date narcotic database last reviewed (include red flags): 02/22/16

## 2016-02-28 ENCOUNTER — Ambulatory Visit (INDEPENDENT_AMBULATORY_CARE_PROVIDER_SITE_OTHER): Payer: BLUE CROSS/BLUE SHIELD | Admitting: Family Medicine

## 2016-02-28 ENCOUNTER — Encounter: Payer: Self-pay | Admitting: Family Medicine

## 2016-02-28 DIAGNOSIS — H6980 Other specified disorders of Eustachian tube, unspecified ear: Secondary | ICD-10-CM | POA: Insufficient documentation

## 2016-02-28 MED ORDER — FLUTICASONE PROPIONATE 50 MCG/ACT NA SUSP: 2.0000 | Freq: Every day | NASAL | Status: DC

## 2016-02-28 NOTE — Patient Instructions (Signed)
Use flonase and gently try to pop your ears.  Should gradually improve.  Take care.  Glad to see you.

## 2016-02-28 NOTE — Progress Notes (Signed)
Pre visit review using our clinic review tool, if applicable. No additional management support is needed unless otherwise documented below in the visit note. 

## 2016-02-28 NOTE — Progress Notes (Signed)
Ear complaint.  L ear feels abnormal.  Feels like fluid is there.    Started 12/2015.  Prev seen and rx'd doxy, started on flonase.  Now only with L ear sx.  No FCNAVD.  No R ear pain.  Hearing is still wnl in L ear.  Still on cough medicine at baseline.  No rhinorrhea, no ST.    Meds, vitals, and allergies reviewed.   ROS: Per HPI unless specifically indicated in ROS section   nad ncat TM w/o erythema B, canals wnl B, but B ETD noted Nasal exam slightly stuffy, sinuses not ttp x4 OP wnl, MMM Neck supple, no LA

## 2016-02-28 NOTE — Assessment & Plan Note (Signed)
D/w pt.  Flonase, gently valsalva, fu prn.  Should resolve.  D/w pt.  See AVS.  She can tolerate flonase, has tolerated prev per patient report.

## 2016-03-23 ENCOUNTER — Other Ambulatory Visit: Payer: Self-pay

## 2016-03-23 NOTE — Telephone Encounter (Signed)
Pt left v/m requesting rx for tussionex. Call when ready for pick up. Last printed #150 on 02/22/16. Last med wellness 09/20/15.

## 2016-03-25 MED ORDER — HYDROCOD POLST-CPM POLST ER 10-8 MG/5ML PO SUER: ORAL | 0 refills | Status: DC

## 2016-03-25 NOTE — Telephone Encounter (Signed)
Printed.  Thanks.  

## 2016-03-26 NOTE — Telephone Encounter (Signed)
Ms. Vaccarello notified that her prescription for Tussinex is ready to be picked up at the front desk.

## 2016-04-24 ENCOUNTER — Other Ambulatory Visit: Payer: Self-pay

## 2016-04-24 MED ORDER — HYDROCOD POLST-CPM POLST ER 10-8 MG/5ML PO SUER: ORAL | 0 refills | Status: DC

## 2016-04-24 NOTE — Telephone Encounter (Signed)
Pt left v/m requesting rx tussionex. Call when ready for pick up. Last printed # 150 ml on 03/25/16.last seen acute visit 02/28/16.

## 2016-04-24 NOTE — Telephone Encounter (Signed)
Printed.  Thanks.  

## 2016-04-24 NOTE — Telephone Encounter (Signed)
Message left advising patient and Rx placed up front for pick up. 

## 2016-05-30 ENCOUNTER — Other Ambulatory Visit: Payer: Self-pay

## 2016-05-30 NOTE — Telephone Encounter (Signed)
Pt left v/m requesting rx tussionex. Call when ready for pick up. Last printed # 150 ml on 04/24/16; last seen 09/20/15 for annual exam.

## 2016-05-31 MED ORDER — HYDROCOD POLST-CPM POLST ER 10-8 MG/5ML PO SUER: ORAL | 0 refills | Status: DC

## 2016-05-31 NOTE — Telephone Encounter (Signed)
Printed.  Thanks.  

## 2016-05-31 NOTE — Telephone Encounter (Signed)
Patient advised.  Rx left at front desk for pick up. 

## 2016-07-03 ENCOUNTER — Other Ambulatory Visit: Payer: Self-pay

## 2016-07-03 NOTE — Telephone Encounter (Signed)
Pt left v/m requesting refill tussionex. Call when ready for pick up. Last printed # 150 ml on 05/31/16; last annual 09/20/15 and last acute on 02/28/16.

## 2016-07-04 MED ORDER — HYDROCOD POLST-CPM POLST ER 10-8 MG/5ML PO SUER: ORAL | 0 refills | Status: DC

## 2016-07-04 NOTE — Telephone Encounter (Signed)
Alice Smith notified that her prescription is ready to be picked up at the front desk.

## 2016-07-04 NOTE — Telephone Encounter (Signed)
Printed.  Thanks.  

## 2016-07-20 ENCOUNTER — Other Ambulatory Visit: Payer: Self-pay | Admitting: Family Medicine

## 2016-07-20 DIAGNOSIS — Z1231 Encounter for screening mammogram for malignant neoplasm of breast: Secondary | ICD-10-CM

## 2016-07-23 ENCOUNTER — Emergency Department: Payer: BLUE CROSS/BLUE SHIELD

## 2016-07-23 ENCOUNTER — Telehealth: Payer: Self-pay | Admitting: Family Medicine

## 2016-07-23 ENCOUNTER — Emergency Department
Admission: EM | Admit: 2016-07-23 | Discharge: 2016-07-23 | Disposition: A | Discharge status: Home / Self Care | Payer: BLUE CROSS/BLUE SHIELD | Attending: Emergency Medicine | Admitting: Emergency Medicine

## 2016-07-23 ENCOUNTER — Encounter: Payer: Self-pay | Admitting: Emergency Medicine

## 2016-07-23 DIAGNOSIS — Y939 Activity, unspecified: Secondary | ICD-10-CM | POA: Diagnosis not present

## 2016-07-23 DIAGNOSIS — Y92511 Restaurant or cafe as the place of occurrence of the external cause: Secondary | ICD-10-CM | POA: Diagnosis not present

## 2016-07-23 DIAGNOSIS — W010XXA Fall on same level from slipping, tripping and stumbling without subsequent striking against object, initial encounter: Secondary | ICD-10-CM | POA: Insufficient documentation

## 2016-07-23 DIAGNOSIS — W19XXXA Unspecified fall, initial encounter: Secondary | ICD-10-CM

## 2016-07-23 DIAGNOSIS — Y999 Unspecified external cause status: Secondary | ICD-10-CM | POA: Diagnosis not present

## 2016-07-23 DIAGNOSIS — S0990XA Unspecified injury of head, initial encounter: Secondary | ICD-10-CM | POA: Diagnosis present

## 2016-07-23 DIAGNOSIS — S0083XA Contusion of other part of head, initial encounter: Secondary | ICD-10-CM | POA: Diagnosis not present

## 2016-07-23 DIAGNOSIS — S0033XA Contusion of nose, initial encounter: Secondary | ICD-10-CM | POA: Insufficient documentation

## 2016-07-23 NOTE — ED Provider Notes (Signed)
[facility] Emergency Department Provider Note   ____________________________________________   I have reviewed the triage vital signs and the nursing notes.   HISTORY  Chief Complaint Fall   History limited by: Not Limited   HPI Alice Smith is a 71 y.o. female who presents to the emergency department today because of concerns for fall. The fall occurred yesterday. She was coming out of a restaurant when she tripped on her feet. She landed on her face. She thinks she might of blackouts since she does not have perfect memory of the events. She since that time has had some headache behind her left eye. Called her doctor's office today who recommended she be evaluated in the emergency department for a CT scan. Patient denies any change in vision. Denies any nausea or vomiting.   Past Medical History:  Diagnosis Date  . Arthritis   . Breast mass 05/2001   benign  . Complication of anesthesia    slow to wake up x 1  . Dry cough    at night  . Hemorrhoids   . Migraine    evaluated with Dr. Domingo Cocking 11/2009  . Osteoporosis     Patient Active Problem List   Diagnosis Date Noted  . ETD (eustachian tube dysfunction) 02/28/2016  . Chronic use of opiate for therapeutic purpose 02/22/2016  . Arm pain, left 07/27/2015  . Memory change 06/22/2015  . Subconjunctival hemorrhage of left eye 06/13/2015  . Back pain 04/11/2015  . Vitamin D deficiency 09/01/2014  . Alkaline phosphatase elevation 09/01/2014  . Advance care planning 09/01/2014  . S/P left UKR 08/31/2013  . Obese 06/24/2013  . DJD (degenerative joint disease) of knee 06/23/2013  . Knee osteoarthritis 05/19/2013  . Edema 12/31/2011  . Medicare annual wellness visit, subsequent 10/14/2011  . PALPITATIONS 03/11/2009  . CHEST DISCOMFORT 03/11/2009  . Cough 02/21/2009  . NAUSEA 02/21/2009  . HERPETIC GINGIVOSTOMATITIS 06/30/2008  . HEMORRHOIDS 01/07/2008  . DIVERTICULOSIS, MILD 01/07/2008  .  HLD (hyperlipidemia) 01/02/2008  . GERD 01/02/2008  . DERMATOPHYTOSIS OF NAIL 12/18/2007  . Osteoporosis 01/28/2007  . Migraine without aura 12/24/2006  . ANXIETY STATE NOS 11/08/2006  . INSOMNIA, CHRONIC 11/08/2006  . ALLERGIC  RHINITIS 07/17/2006  . KNEE PAIN, RIGHT 07/17/2006    Past Surgical History:  Procedure Laterality Date  . ABDOMINAL EXPLORATION SURGERY     for twisted bowel  . ABDOMINAL HYSTERECTOMY    . BREAST SURGERY  1989   aspirated lump left breast  . CARPAL TUNNEL RELEASE     right 03/2001, left 07/2001  . ESOPHAGOGASTRODUODENOSCOPY    . KNEE ARTHROSCOPY     rt knee  . nissenfundiplication    . PARTIAL HYSTERECTOMY  1989   dyspasia, ovaries left in  . PARTIAL KNEE ARTHROPLASTY Right 06/23/2013   Procedure: RIGHT KNEE MEDIAL UNICOMPARTMENTAL ARTHROPLASTY;  Surgeon: Mauri Pole, MD;  Location: [facility];  Service: Orthopedics;  Laterality: Right;  . PARTIAL KNEE ARTHROPLASTY Left 08/31/2013   Procedure: LEFT UNICOMPARTMENTAL KNEE ATHROPLASTY   (MEDIAL) ;  Surgeon: Mauri Pole, MD;  Location: [facility];  Service: Orthopedics;  Laterality: Left;  . ROTATOR CUFF REPAIR     right 03/2001, left 07/2001    Prior to Admission medications   Medication Sig Start Date End Date Taking? Authorizing Provider  alendronate (FOSAMAX) 70 MG tablet Take 1 tablet (70 mg total) by mouth every 7 (seven) days. Take with a full glass of water on an empty stomach. 09/20/15  Tonia Ghent, MD  aspirin 81 MG tablet Take 81 mg by mouth daily.    [provider]  chlorpheniramine-HYDROcodone Amanda Cockayne PENNKINETIC ER) 10-8 MG/5ML SUER Take 5 mLs by mouth at bedtime as needed for cough 07/04/16   Tonia Ghent, MD  cholecalciferol (VITAMIN D) 1000 units tablet Take 1 tablet (1,000 Units total) by mouth every other day. 09/20/15   Tonia Ghent, MD  cyclobenzaprine (FLEXERIL) 10 MG tablet Take 0.5 tablets (5 mg total) by mouth 3 (three) times daily as needed for muscle spasms.  12/30/15   Elby Beck, FNP  fexofenadine-pseudoephedrine (ALLEGRA-D) 60-120 MG per tablet Take 1 tablet by mouth as needed.    [provider]  fluticasone (FLONASE) 50 MCG/ACT nasal spray Place 2 sprays into both nostrils daily. 02/28/16   Tonia Ghent, MD  Lactobacillus-Inulin ([place]) Take by mouth daily.    [provider]  metoprolol succinate (TOPROL-XL) 50 MG 24 hr tablet Take 1 tablet (50 mg total) by mouth daily. 09/20/15   Tonia Ghent, MD  Multiple Vitamin (MULTIVITAMIN) capsule Take 1 capsule by mouth daily.    [provider]  NON FORMULARY Mega Red daily.    [provider]  promethazine (PHENERGAN) 25 MG tablet Take 1 tablet (25 mg total) by mouth every 6 (six) hours as needed for nausea or vomiting. 12/30/15   Elby Beck, FNP  SUPER B COMPLEX/C PO Take by mouth daily.    [provider]    Allergies Clarithromycin; Erythromycin ethylsuccinate; Fluticasone-salmeterol; Procaine hcl; Sulfonamide derivatives; and Topiramate  Family History  Problem Relation Age of Onset  . Arthritis Mother   . Cancer Mother        lung cancer, dx'd in her 68s  . Cancer Father        throat  . COPD Brother   . Cancer Sister        breast  . Breast cancer Sister   . Colon cancer Neg Hx     Social History Social History  Substance Use Topics  . Smoking status: Former Smoker    Packs/day: 1.00    Years: 5.00    Types: Cigarettes    Quit date: 06/17/1978  . Smokeless tobacco: Never Used     Comment: 5 pack year history  . Alcohol use No    Review of Systems Constitutional: No fever/chills Eyes: No visual changes. ENT: No sore throat. Cardiovascular: Denies chest pain. Respiratory: Denies shortness of breath. Gastrointestinal: No abdominal pain.  No nausea, no vomiting.  No diarrhea.   Genitourinary: Negative for dysuria. Musculoskeletal: Negative for back pain. Skin: Positive for bruising  on her face Neurological: Negative for headaches, focal weakness or numbness.  ____________________________________________   PHYSICAL EXAM:  VITAL SIGNS: ED Triage Vitals  Enc Vitals Group     BP 07/23/16 1526 135/71     Pulse Rate 07/23/16 1526 74     Resp 07/23/16 1526 18     Temp 07/23/16 1526 98.5 F (36.9 C)     Temp Source 07/23/16 1526 Oral     SpO2 07/23/16 1526 95 %     Weight 07/23/16 1526 153 lb (69.4 kg)     Height 07/23/16 1526 5\' 2"  (1.575 m)     Head Circumference --      Peak Flow --      Pain Score 07/23/16 1525 8   Constitutional: Alert and oriented. Well appearing and in no distress. Eyes:  Conjunctivae are normal. EOMI. ENT   Head: Normocephalic. No hemotympanum.    Nose: No congestion/rhinnorhea. No septal hematoma   Mouth/Throat: Mucous membranes are moist.   Neck: No stridor. No midline tenderness Hematological/Lymphatic/Immunilogical: No cervical lymphadenopathy. Cardiovascular: Normal rate, regular rhythm.  No murmurs, rubs, or gallops.  Respiratory: Normal respiratory effort without tachypnea nor retractions. Breath sounds are clear and equal bilaterally. No wheezes/rales/rhonchi. Genitourinary: Deferred Musculoskeletal: Normal range of motion in all extremities. No lower extremity edema. Neurologic:  Normal speech and language. No gross focal neurologic deficits are appreciated.  Skin:  Ecchymosis to bridge of nose, face. Psychiatric: Mood and affect are normal. Speech and behavior are normal. Patient exhibits appropriate insight and judgment.  ____________________________________________    LABS (pertinent positives/negatives)  None  ____________________________________________   EKG  None  ____________________________________________    RADIOLOGY  CT head/max/face  IMPRESSION: No evidence of acute intracranial abnormality. Atrophy with small vessel ischemic changes.  No evidence of maxillofacial  fracture.  ____________________________________________   PROCEDURES  Procedures  ____________________________________________   INITIAL IMPRESSION / ASSESSMENT AND PLAN / ED COURSE  Pertinent labs & imaging results that were available during my care of the patient were reviewed by me and considered in my medical decision making (see chart for details).  Patient presents to the emergency department today for CT scans after a fall yesterday. CT scans negative.. No other concerning traumatic findings. Think patient is safe for discharge home.  ____________________________________________   FINAL CLINICAL IMPRESSION(S) / ED DIAGNOSES  Final diagnoses:  Fall, initial encounter  Contusion of face, initial encounter     Note: This dictation was prepared with Diplomatic Services operational officer dictation. Any transcriptional errors that result from this process are unintentional     Nance Pear, MD 07/23/16 [phone]

## 2016-07-23 NOTE — Discharge Instructions (Signed)
Please seek medical attention for any high fevers, chest pain, shortness of breath, change in behavior, persistent vomiting, bloody stool or any other new or concerning symptoms.  

## 2016-07-23 NOTE — ED Triage Notes (Signed)
States tripped and fell coming out of restaurant yesterday. States that she thinks she had a loss of consciousness. When questioned if she woke on ground states yes. States takes baby asa a day. Denies other blood thinners. Forehead, nose and chin bruised.

## 2016-07-23 NOTE — Telephone Encounter (Signed)
Patient Name: BRE PECINA  DOB: 1945-05-29    Initial Comment caller states she fell yesterday on her face an she blacked out .She has a headache, confusion . she wants to be seen .   Nurse Assessment  Nurse: Leilani Merl, RN, Heather Date/Time (Eastern Time): 07/23/2016 2:41:03 PM  Confirm and document reason for call. If symptomatic, describe symptoms. ---caller states she fell yesterday on her face an she blacked out .She has a headache, confusion . she wants to be seen  Does the patient have any new or worsening symptoms? ---Yes  Will a triage be completed? ---Yes  Related visit to physician within the last 2 weeks? ---No  Does the PT have any chronic conditions? (i.e. diabetes, asthma, etc.) ---Yes  List chronic conditions. ---See MR  Is this a behavioral health or substance abuse call? ---No     Guidelines    Guideline Title Affirmed Question Affirmed Notes  Head Injury Can't remember what happened (amnesia)    Final Disposition User   Go to ED Now [place], RN, [facility] - ED   Disagree/Comply: Comply

## 2016-07-23 NOTE — Telephone Encounter (Signed)
Per chart review tab pt went to [facility] ED. 

## 2016-07-27 ENCOUNTER — Ambulatory Visit (INDEPENDENT_AMBULATORY_CARE_PROVIDER_SITE_OTHER): Payer: BLUE CROSS/BLUE SHIELD | Admitting: Family Medicine

## 2016-07-27 ENCOUNTER — Encounter: Payer: Self-pay | Admitting: Family Medicine

## 2016-07-27 DIAGNOSIS — S060X9D Concussion with loss of consciousness of unspecified duration, subsequent encounter: Secondary | ICD-10-CM

## 2016-07-27 NOTE — Progress Notes (Signed)
ER f/u for presumed concussion.  She didn't remember all of the event.  Presumed brief LOC or at least impaired memory of the event.  She fell after tripping on a curb. Family helped her up. Over the next 24 hours, she had more HA.  She was advised to go to ER, CT neg.  ER course discussed with patient.  Discharged home. In the meantime, still with HA with nausea, not worse than prev.  No new numbness or tingling.  No photo/phonophobia.  No new neurologic symptoms. She has bruising on the right side of face that is slowly resolving. No other trauma.  Meds, vitals, and allergies reviewed.   ROS: Per HPI unless specifically indicated in ROS section   GEN: nad, alert and oriented, bruising noted near the right orbit and on the right side of the mandible HEENT: mucous membranes moist, OP wnl NECK: supple w/o LA CV: rrr.  PULM: ctab, no inc wob ABD: soft, +bs EXT: no edema SKIN: no acute rash CN 2-12 wnl B, S/S/DTR wnl x4

## 2016-07-27 NOTE — Patient Instructions (Signed)
Presumed concussion.  Continue as is with the ibuprofen if needed.  Update me if worse or if not getting better in the next week.  "Brain rest" for now.  Take it easy.  Take care.  Glad to see you.

## 2016-07-28 ENCOUNTER — Encounter: Payer: Self-pay | Admitting: Family Medicine

## 2016-07-28 DIAGNOSIS — S060X9A Concussion with loss of consciousness of unspecified duration, initial encounter: Secondary | ICD-10-CM | POA: Insufficient documentation

## 2016-07-28 DIAGNOSIS — S060XAA Concussion with loss of consciousness status unknown, initial encounter: Secondary | ICD-10-CM | POA: Insufficient documentation

## 2016-07-28 NOTE — Assessment & Plan Note (Signed)
Presumed concussion given that she does not recall all of the events. Discussed with patient. She has superficial bruising, but no fracture seen on head CT. No acute brain abnormality seen on CT. She does likely have postconcussive symptoms with headache with some nausea. Neurologic exam is otherwise nonfocal. Discussed with patient about pathophysiology of concussion and treatment. She needs "brain rest" where she avoids concentration, high stimulus events, exertion. Discussed with patient. She understood. Continue with ibuprofen as needed for headache now is that seems to be helping some. Update me if needed, if symptoms continue and certainly if they worsen. Husband understood the plan. He is driving the patient today so she can rest. >25 minutes spent in face to face time with patient, >50% spent in counselling or coordination of care.

## 2016-08-06 ENCOUNTER — Other Ambulatory Visit: Payer: Self-pay

## 2016-08-06 NOTE — Telephone Encounter (Signed)
Pt left v/m requesting rx tussionex. Last printed # 150 ml on 07/04/16. Pt last seen hospital f/u on 07/27/16.

## 2016-08-07 MED ORDER — HYDROCOD POLST-CPM POLST ER 10-8 MG/5ML PO SUER
ORAL | 0 refills | Status: DC
Start: 2016-08-07 — End: 2016-09-06

## 2016-08-07 NOTE — Telephone Encounter (Signed)
Patient advised.  Rx left at front desk for pick up. 

## 2016-08-07 NOTE — Telephone Encounter (Signed)
Printed.  Thanks.  

## 2016-08-10 ENCOUNTER — Ambulatory Visit
Admission: RE | Admit: 2016-08-10 | Discharge: 2016-08-10 | Disposition: A | Discharge status: Home / Self Care | Payer: BLUE CROSS/BLUE SHIELD | Source: Ambulatory Visit | Attending: Family Medicine | Admitting: Family Medicine

## 2016-08-10 DIAGNOSIS — Z1231 Encounter for screening mammogram for malignant neoplasm of breast: Secondary | ICD-10-CM

## 2016-08-13 ENCOUNTER — Encounter: Payer: Self-pay | Admitting: *Deleted

## 2016-08-30 ENCOUNTER — Other Ambulatory Visit: Payer: Self-pay | Admitting: *Deleted

## 2016-08-30 NOTE — Telephone Encounter (Signed)
Faxed refill request. Alendronate Last office visit:   07/27/16 Last Filled:    13 tablet 3 09/20/2015  Please advise if patient is to continue.

## 2016-08-31 ENCOUNTER — Other Ambulatory Visit: Payer: Self-pay | Admitting: Family Medicine

## 2016-08-31 MED ORDER — ALENDRONATE SODIUM 70 MG PO TABS: 70.0000 mg | ORAL_TABLET | ORAL | 3 refills | Status: DC

## 2016-08-31 NOTE — Telephone Encounter (Signed)
Left detailed message on voicemail.  

## 2016-08-31 NOTE — Telephone Encounter (Signed)
Sent. Thanks.  Due for CPE. 

## 2016-09-03 ENCOUNTER — Other Ambulatory Visit: Payer: Self-pay | Admitting: Family Medicine

## 2016-09-06 ENCOUNTER — Other Ambulatory Visit: Payer: Self-pay

## 2016-09-06 NOTE — Telephone Encounter (Signed)
Pt left v/m requesting rx tussionex. Call when ready for pick up. Last printed # 150 ml on 08/07/16. Pt last seen 07/27/16.

## 2016-09-07 MED ORDER — HYDROCOD POLST-CPM POLST ER 10-8 MG/5ML PO SUER: ORAL | 0 refills | Status: DC

## 2016-09-07 NOTE — Telephone Encounter (Signed)
Printed.  Thanks.  

## 2016-09-07 NOTE — Telephone Encounter (Signed)
Patient advised.  Rx left at front desk for pick up. 

## 2016-09-10 ENCOUNTER — Other Ambulatory Visit: Payer: Self-pay | Admitting: *Deleted

## 2016-10-08 ENCOUNTER — Other Ambulatory Visit: Payer: Self-pay

## 2016-10-08 NOTE — Telephone Encounter (Signed)
Pt left v/m requesting rx tussionex. Call when ready for pick up. Last printed # 150 ml on 09/07/16. Pt last seen 07/27/16.

## 2016-10-09 MED ORDER — HYDROCOD POLST-CPM POLST ER 10-8 MG/5ML PO SUER: ORAL | 0 refills | Status: DC

## 2016-10-09 NOTE — Telephone Encounter (Signed)
Patient advised.  Rx left at front desk for pick up. 

## 2016-10-09 NOTE — Telephone Encounter (Signed)
Printed.  Thanks.  

## 2016-11-07 ENCOUNTER — Other Ambulatory Visit: Payer: Self-pay | Admitting: *Deleted

## 2016-11-07 NOTE — Telephone Encounter (Signed)
Last Rx 10/09/2016. Last OV 07/2016

## 2016-11-08 MED ORDER — HYDROCOD POLST-CPM POLST ER 10-8 MG/5ML PO SUER: ORAL | 0 refills | Status: DC

## 2016-11-08 NOTE — Telephone Encounter (Signed)
Printed.  Thanks.  

## 2016-11-08 NOTE — Telephone Encounter (Signed)
Patient advised.  Rx left at front desk for pick up. 

## 2016-12-10 ENCOUNTER — Other Ambulatory Visit: Payer: Self-pay | Admitting: *Deleted

## 2016-12-10 DIAGNOSIS — Z79891 Long term (current) use of opiate analgesic: Secondary | ICD-10-CM

## 2016-12-10 NOTE — Telephone Encounter (Signed)
Last Rx 11/08/2016. Pt has not been evaluated for cough since 12/2015

## 2016-12-10 NOTE — Telephone Encounter (Signed)
Copied from [place] #[id number]. Topic: Quick Communication - See Telephone Encounter >> Dec 10, 2016 11:32 AM Aurelio Brash B wrote: CRM for notification. See Telephone encounter for:  12/10/16. Med refill

## 2016-12-10 NOTE — Telephone Encounter (Signed)
Pt requesting refill of Tussionex

## 2016-12-11 ENCOUNTER — Other Ambulatory Visit: Payer: Self-pay | Admitting: Family Medicine

## 2016-12-11 MED ORDER — HYDROCOD POLST-CPM POLST ER 10-8 MG/5ML PO SUER: ORAL | 0 refills | Status: DC

## 2016-12-11 NOTE — Telephone Encounter (Signed)
Received refill electronically Last refill 08/31/16 #90, note sent to patient to schedule CPE for further refills Last CPE 09/20/15 No upcoming appointment schedule

## 2016-12-11 NOTE — Telephone Encounter (Signed)
Called and spoke with patient informing her that prescription has been printed and placed upfront for pick-up. Understanding verbalized.

## 2016-12-11 NOTE — Telephone Encounter (Signed)
Printed.  Thanks.  

## 2016-12-12 NOTE — Telephone Encounter (Signed)
Please schedule appointment as instructed. 

## 2016-12-12 NOTE — Telephone Encounter (Signed)
Schedule CPE.  rx sent.  Thanks.

## 2016-12-12 NOTE — Telephone Encounter (Signed)
Should this be scheduled as a medicare wellness?

## 2016-12-12 NOTE — Telephone Encounter (Signed)
Yes, cpe/amw, whatever she can get set up.  Thanks.

## 2016-12-21 NOTE — Telephone Encounter (Signed)
Scheduled as instructed on 01/25/17. Can only have cpe with PCP. Does not qualify for AWV with [facility] due to only having Part A Medicare.

## 2017-01-13 ENCOUNTER — Other Ambulatory Visit: Payer: Self-pay | Admitting: Family Medicine

## 2017-01-13 DIAGNOSIS — E785 Hyperlipidemia, unspecified: Secondary | ICD-10-CM

## 2017-01-13 DIAGNOSIS — E559 Vitamin D deficiency, unspecified: Secondary | ICD-10-CM

## 2017-01-14 ENCOUNTER — Telehealth: Payer: Self-pay | Admitting: Family Medicine

## 2017-01-14 MED ORDER — HYDROCOD POLST-CPM POLST ER 10-8 MG/5ML PO SUER: ORAL | 0 refills | Status: DC

## 2017-01-14 NOTE — Telephone Encounter (Signed)
Printed.  Thanks.  

## 2017-01-14 NOTE — Telephone Encounter (Signed)
Patient advised.  Rx left at front desk for pick up. 

## 2017-01-14 NOTE — Telephone Encounter (Signed)
Copied from [place]. Topic: Quick Communication - See Telephone Encounter >> Jan 14, 2017 10:13 AM Bea Graff, NT wrote: CRM for notification. See Telephone encounter for: Patient requesting refill of hydrocodonechlorphen. [facility] [address]  [facility].

## 2017-01-14 NOTE — Telephone Encounter (Signed)
See request for refill on Chlorpheneramine-Hydrocodone cough medication; denied any new symptoms; reported chronic cough that she is using this for.

## 2017-01-18 ENCOUNTER — Other Ambulatory Visit (INDEPENDENT_AMBULATORY_CARE_PROVIDER_SITE_OTHER): Payer: 59

## 2017-01-18 DIAGNOSIS — E559 Vitamin D deficiency, unspecified: Secondary | ICD-10-CM | POA: Diagnosis not present

## 2017-01-18 DIAGNOSIS — E785 Hyperlipidemia, unspecified: Secondary | ICD-10-CM | POA: Diagnosis not present

## 2017-01-18 LAB — COMPREHENSIVE METABOLIC PANEL
ALT: 14 U/L (ref 0–35)
AST: 17 U/L (ref 0–37)
Albumin: 4 g/dL (ref 3.5–5.2)
Alkaline Phosphatase: 80 U/L (ref 39–117)
BUN: 18 mg/dL (ref 6–23)
CHLORIDE: 106 meq/L (ref 96–112)
CO2: 29 meq/L (ref 19–32)
CREATININE: 0.73 mg/dL (ref 0.40–1.20)
Calcium: 9.8 mg/dL (ref 8.4–10.5)
GFR: 83.37 mL/min (ref >60.00)
GLUCOSE: 101 mg/dL — AB (ref 70–99)
Potassium: 4.5 mEq/L (ref 3.5–5.1)
SODIUM: 141 meq/L (ref 135–145)
Total Bilirubin: 0.6 mg/dL (ref 0.2–1.2)
Total Protein: 6.9 g/dL (ref 6.0–8.3)

## 2017-01-18 LAB — LIPID PANEL
CHOL/HDL RATIO: 4
Cholesterol: 198 mg/dL (ref 0–200)
HDL: 52.3 mg/dL (ref >39.00)
LDL CALC: 113 mg/dL — AB (ref 0–99)
NonHDL: 145.45
Triglycerides: 164 mg/dL — ABNORMAL HIGH (ref 0.0–149.0)
VLDL: 32.8 mg/dL (ref 0.0–40.0)

## 2017-01-18 LAB — VITAMIN D 25 HYDROXY (VIT D DEFICIENCY, FRACTURES): VITD: 40.27 ng/mL (ref 30.00–100.00)

## 2017-01-21 ENCOUNTER — Telehealth: Payer: Self-pay | Admitting: Family Medicine

## 2017-01-21 DIAGNOSIS — R413 Other amnesia: Secondary | ICD-10-CM

## 2017-01-21 NOTE — Telephone Encounter (Signed)
Copied from [place] [id number]. Topic: Quick Communication - See Telephone Encounter >> Jan 21, 2017  3:19 PM Robina Ade, Helene Kelp D wrote: CRM for notification. See Telephone encounter for: 01/21/17. Patient husband Mr. Jimmy Picket called and just wanted to make sure that Dr. Damita Dunnings got the letter he left for him Friday 11/30 at the front desk for a neurology referral for his wife. Please call patient with an update of this.

## 2017-01-22 ENCOUNTER — Other Ambulatory Visit (INDEPENDENT_AMBULATORY_CARE_PROVIDER_SITE_OTHER): Payer: 59

## 2017-01-22 DIAGNOSIS — R413 Other amnesia: Secondary | ICD-10-CM

## 2017-01-22 LAB — CBC WITH DIFFERENTIAL/PLATELET
BASOS PCT: 0.7 % (ref 0.0–3.0)
Basophils Absolute: 0 10*3/uL (ref 0.0–0.1)
EOS ABS: 0.3 10*3/uL (ref 0.0–0.7)
Eosinophils Relative: 3.5 % (ref 0.0–5.0)
HEMATOCRIT: 41.2 % (ref 36.0–46.0)
Hemoglobin: 13.9 g/dL (ref 12.0–15.0)
LYMPHS PCT: 28.4 % (ref 12.0–46.0)
Lymphs Abs: 2.1 10*3/uL (ref 0.7–4.0)
MCHC: 33.8 g/dL (ref 30.0–36.0)
MCV: 92.8 fl (ref 78.0–100.0)
Monocytes Absolute: 0.5 10*3/uL (ref 0.1–1.0)
Monocytes Relative: 7.3 % (ref 3.0–12.0)
NEUTROS ABS: 4.3 10*3/uL (ref 1.4–7.7)
Neutrophils Relative %: 60.1 % (ref 43.0–77.0)
PLATELETS: 280 10*3/uL (ref 150.0–400.0)
RBC: 4.44 Mil/uL (ref 3.87–5.11)
RDW: 13.3 % (ref 11.5–15.5)
WBC: 7.2 10*3/uL (ref 4.0–10.5)

## 2017-01-22 LAB — VITAMIN B12: Vitamin B-12: 428 pg/mL (ref 211–911)

## 2017-01-22 LAB — TSH: TSH: 5.74 u[IU]/mL — ABNORMAL HIGH (ref 0.35–4.50)

## 2017-01-22 NOTE — Telephone Encounter (Signed)
Patient scheduled for labs this afternoon and patient does agree that she wants the referral.

## 2017-01-22 NOTE — Telephone Encounter (Signed)
Saw the note.  We still need verification directly from the patient that she agrees to the referral.   And she needs f/u CBC B12 and TSH.  I put in those orders, can be done at pending OV.  We need to do the preliminary work prior to the referral.   Let me know about the above.  Thanks.

## 2017-01-22 NOTE — Telephone Encounter (Signed)
Patient advised.  Lab appt scheduled.  

## 2017-01-23 NOTE — Addendum Note (Signed)
Addended by: Tonia Ghent on: 01/23/2017 10:10 AM   Modules accepted: Orders

## 2017-01-23 NOTE — Telephone Encounter (Signed)
Patient notified by telephone that the referral coordinator will be in touch with her about the referral.

## 2017-01-23 NOTE — Telephone Encounter (Signed)
Ordered referral. Thanks.  

## 2017-01-25 ENCOUNTER — Encounter: Payer: Self-pay | Admitting: Family Medicine

## 2017-01-25 ENCOUNTER — Ambulatory Visit (INDEPENDENT_AMBULATORY_CARE_PROVIDER_SITE_OTHER): Payer: 59 | Admitting: Family Medicine

## 2017-01-25 VITALS — BP 102/60 | HR 71 | Temp 98.7°F | Ht 62.0 in | Wt 172.5 lb

## 2017-01-25 DIAGNOSIS — R413 Other amnesia: Secondary | ICD-10-CM | POA: Diagnosis not present

## 2017-01-25 DIAGNOSIS — M810 Age-related osteoporosis without current pathological fracture: Secondary | ICD-10-CM

## 2017-01-25 DIAGNOSIS — R059 Cough, unspecified: Secondary | ICD-10-CM

## 2017-01-25 DIAGNOSIS — R05 Cough: Secondary | ICD-10-CM | POA: Diagnosis not present

## 2017-01-25 NOTE — Progress Notes (Signed)
Osteoporosis on DXA 08/2014.  Fosamax started 09/2014. No ADE on med.  Complaint.  D/w pt.  Reasonable to defer f/u DXA at this point.    Cough.  Controlled, still on hydrocodone daily and that was the only thing that had made a difference, d/w pt.   L handed female with concussion hx noted, along with mult surgeries requiring sedation.  Memory and mood changes.  Per patient her mood is "kind of flat."  Noted after the last fall.  She thinks it is stable in the meantime.  Episodic confusion noted in the past, since this summer.  She can still balance a checkbook.  She hasn't gotten lost driving in the last 2-3 months.  Not leaving a pan on the stove, etc, no high risk events.  She is looking forward to the holidays.    Recent lab d/w pt.   PMH and SH reviewed  ROS: Per HPI unless specifically indicated in ROS section   Meds, vitals, and allergies reviewed.   GEN: nad, alert and oriented, She has a flatter affect, than typical.  HEENT: mucous membranes moist NECK: supple w/o LA CV: rrr.  PULM: ctab, no inc wob ABD: soft, +bs EXT: no edema SKIN: no acute rash No tremor noted.  Gait is steady. CN 2-12 wnl B, S/S/DTR wnl x4  Mini-Mental status exam 27 out of 30.  She misses 2 points for attention and 1 point for recall.

## 2017-01-25 NOTE — Patient Instructions (Addendum)
Alice Smith will call about your referral. Don't change your meds for now.  Your thyroid test is minimally abnormal but this likely doesn't explain all of your symptoms.  I want input from neurology before starting any medicine for your thyroid.  Take care.  Glad to see you.

## 2017-01-30 ENCOUNTER — Encounter: Payer: Self-pay | Admitting: Family Medicine

## 2017-01-30 NOTE — Assessment & Plan Note (Signed)
Mini-Mental status exam 27 out of 30.  She misses 2 points for attention and 1 point for recall.  Recent labs discussed with patient.  She has mild elevation in TSH but this is likely not enough to cause her symptoms.  She does have history of concussion noted.  History of multiple surgeries requiring sedation.  Unclear how much of that affects her current situation with memory.  Discussed with patient.  Before starting any thyroid replacement I would prefer patient to have neurology input.  Previous labs and imaging discussed with patient.  She agrees with plan.  At this point still okay for outpatient follow-up.  All questions answered. >25 minutes spent in face to face time with patient, >50% spent in counselling or coordination of care.

## 2017-01-30 NOTE — Assessment & Plan Note (Addendum)
Due for follow-up bone density test at this point.  More important to pursue workup regarding memory loss.  Continue medication as is.

## 2017-01-30 NOTE — Assessment & Plan Note (Signed)
Continue current medication as is for now.  No adverse effect of medication.  Hydrocodone cough syrup is the only thing that helps her cough at night.

## 2017-02-04 ENCOUNTER — Ambulatory Visit (INDEPENDENT_AMBULATORY_CARE_PROVIDER_SITE_OTHER): Payer: 59 | Admitting: Diagnostic Neuroimaging

## 2017-02-04 ENCOUNTER — Encounter: Payer: Self-pay | Admitting: Diagnostic Neuroimaging

## 2017-02-04 DIAGNOSIS — R413 Other amnesia: Secondary | ICD-10-CM

## 2017-02-04 MED ORDER — MEMANTINE HCL 10 MG PO TABS: 10.0000 mg | ORAL_TABLET | Freq: Two times a day (BID) | ORAL | 12 refills | Status: DC

## 2017-02-04 NOTE — Progress Notes (Signed)
[facility]  PATIENT: Alice Smith DOB: 09/30/1945  REFERRING CLINICIAN: Orlinda Blalock, MD HISTORY FROM: patient, husband, daughter  REASON FOR VISIT: new consult    HISTORICAL  CHIEF COMPLAINT:  Chief Complaint  Patient presents with  . Memory Loss    rm 6, New Pt, husband- Alice Smith, dgtr- Alice Smith, MMSE 23, "difficulty processing information, lack of emotions, some cognitive decline and confusion, esp since JUne- got lost driving home"    HISTORY OF PRESENT ILLNESS:   70 year old left-handed female here for evaluation of cognitive decline, confusion, lack of emotions, difficulty processing information.  Family has noted at least 2 years of gradual onset progressive confusion, fatigue, decreased processing speed, comprehension difficulty, decreased interest in day-to-day activity specifically household chores and cleanliness, which she used to be quite involved with.  She is also shown flat and emotional affect, less nurturing, less social.  She has had trouble with maintaining her checkbook, finances, shopping and ordering.  In June 2018 she got lost in [place] when trying to pick up their grandson.  Overall patient having decreased speech output and fluency.  Patient also underwent 7 or 8 surgeries in 2014, and family noted that many symptoms started at that time.   REVIEW OF SYSTEMS: Full 14 system review of systems performed and negative with exception of: Fatigue memory loss confusion insomnia sleepiness cramps depression not enough sleep disinterest in activities urination problems cough allergies.  ALLERGIES: Allergies  Allergen Reactions  . Clarithromycin     REACTION: nausea and vomiting  . Erythromycin Ethylsuccinate     REACTION: GI upset  . Fluticasone-Salmeterol     REACTION: Sore tongue and rash  . Procaine Hcl     REACTION: swelling  . Sulfonamide Derivatives     REACTION: rash  . Topiramate     REACTION: confusion    HOME  MEDICATIONS: Outpatient Medications Prior to Visit  Medication Sig Dispense Refill  . alendronate (FOSAMAX) 70 MG tablet Take 1 tablet (70 mg total) by mouth every 7 (seven) days. Take with a full glass of water on an empty stomach. 13 tablet 3  . aspirin 81 MG tablet Take 81 mg by mouth daily.    . chlorpheniramine-HYDROcodone (TUSSIONEX PENNKINETIC ER) 10-8 MG/5ML SUER Take 5 mLs by mouth at bedtime as needed for cough 150 mL 0  . cholecalciferol (VITAMIN D) 1000 units tablet Take 1 tablet (1,000 Units total) by mouth every other day.    Marland Kitchen dextromethorphan-guaiFENesin (MUCINEX DM) 30-600 MG 12hr tablet Take 1 tablet by mouth daily.    . fexofenadine-pseudoephedrine (ALLEGRA-D) 60-120 MG per tablet Take 1 tablet by mouth as needed.    . fluticasone (FLONASE) 50 MCG/ACT nasal spray Place 2 sprays into both nostrils daily.    . metoprolol succinate (TOPROL-XL) 50 MG 24 hr tablet TAKE 1 TABLET BY MOUTH EVERY DAY 90 tablet 0  . Multiple Vitamin (MULTIVITAMIN) capsule Take 1 capsule by mouth daily.    . NON FORMULARY Mega Red daily.    Marland Kitchen OVER THE COUNTER MEDICATION Cognium - 1 a day    . OVER THE COUNTER MEDICATION Collagen Peptides - 1 scoop at night.    Marland Kitchen OVER THE COUNTER MEDICATION Ker4din  -  1 drop on nail    . Probiotic Product (PROBIOTIC-10 PO) Take by mouth.    . promethazine (PHENERGAN) 25 MG tablet Take 1 tablet (25 mg total) by mouth every 6 (six) hours as needed for nausea or vomiting. 30 tablet 0  .  SUPER B COMPLEX/C PO Take by mouth daily.     No facility-administered medications prior to visit.     PAST MEDICAL HISTORY: Past Medical History:  Diagnosis Date  . Arthritis   . Breast mass 05/2001   benign  . Complication of anesthesia    slow to wake up x 1  . Concussion 2014, 05/2016  . Dry cough    at night  . Hemorrhoids   . Migraine    evaluated with Dr. Domingo Cocking 11/2009  . Osteoporosis     PAST SURGICAL HISTORY: Past Surgical History:  Procedure Laterality Date  .  ABDOMINAL EXPLORATION SURGERY     for twisted bowel  . ABDOMINAL HYSTERECTOMY     yrs ago  . BREAST SURGERY  1989   aspirated lump left breast  . CARPAL TUNNEL RELEASE     right 03/2001, left 07/2001  . ESOPHAGOGASTRODUODENOSCOPY    . KNEE ARTHROSCOPY     rt knee  . nissenfundiplication    . PARTIAL HYSTERECTOMY  1989   dyspasia, ovaries left in  . PARTIAL KNEE ARTHROPLASTY Right 06/23/2013   Procedure: RIGHT KNEE MEDIAL UNICOMPARTMENTAL ARTHROPLASTY;  Surgeon: Mauri Pole, MD;  Location: [facility];  Service: Orthopedics;  Laterality: Right;  . PARTIAL KNEE ARTHROPLASTY Left 08/31/2013   Procedure: LEFT UNICOMPARTMENTAL KNEE ATHROPLASTY   (MEDIAL) ;  Surgeon: Mauri Pole, MD;  Location: [facility];  Service: Orthopedics;  Laterality: Left;  . ROTATOR CUFF REPAIR     right 03/2001, left 07/2001    FAMILY HISTORY: Family History  Problem Relation Age of Onset  . Arthritis Mother   . Cancer Mother        lung cancer, dx'd in her 70s  . Cancer Father        throat  . COPD Brother   . Cancer Sister        breast  . Breast cancer Sister   . Colon cancer Neg Hx     SOCIAL HISTORY:  Social History   Socioeconomic History  . Marital status: Married    Spouse name: Not on file  . Number of children: 3  . Years of education: Not on file  . Highest education level: Not on file  Social Needs  . Financial resource strain: Not on file  . Food insecurity - worry: Not on file  . Food insecurity - inability: Not on file  . Transportation needs - medical: Not on file  . Transportation needs - non-medical: Not on file  Occupational History  . Occupation: home  Tobacco Use  . Smoking status: Former Smoker    Packs/day: 1.00    Years: 5.00    Pack years: 5.00    Types: Cigarettes    Last attempt to quit: 06/17/1978    Years since quitting: 38.6  . Smokeless tobacco: Never Used  . Tobacco comment: 5 pack year history  Substance and Sexual Activity  . Alcohol use: No  . Drug use: No   . Sexual activity: Not on file  Other Topics Concern  . Not on file  Social History Narrative   Second marriage, 76. Has 3 adult children, adopted 4, 6 grandkids.   From [place], [place]   Education- GED, some college     PHYSICAL EXAM  GENERAL EXAM/CONSTITUTIONAL: Vitals:  Vitals:   02/04/17 1032  BP: 133/79  Pulse: 67  Weight: 176 lb (79.8 kg)  Height: 5' 2.5" (1.588 m)     Body mass index is  31.68 kg/m.  Visual Acuity Screening   Right eye Left eye Both eyes  Without correction:     With correction: 20/50 20/30      Patient is in no distress; well developed, nourished and groomed; neck is supple  CARDIOVASCULAR:  Examination of carotid arteries is normal; no carotid bruits  Regular rate and rhythm, no murmurs  Examination of peripheral vascular system by observation and palpation is normal  EYES:  Ophthalmoscopic exam of optic discs and posterior segments is normal; no papilledema or hemorrhages  MUSCULOSKELETAL:  Gait, strength, tone, movements noted in Neurologic exam below  NEUROLOGIC: MENTAL STATUS:  MMSE - Mini Mental State Exam 02/04/2017  Orientation to time 5  Orientation to Place 5  Registration 3  Attention/ Calculation 0  Recall 2  Language- name 2 objects 2  Language- repeat 0  Language- follow 3 step command 3  Language- read & follow direction 1  Write a sentence 1  Copy design 1  Total score 23    awake, alert, oriented to person, place and time  [facility] memory  DECR attention and concentration  DECR FLUENCY; comprehension intact, naming intact,   fund of knowledge appropriate  FLAT AFFECT  CRANIAL NERVE:   2nd - no papilledema on fundoscopic exam  2nd, 3rd, 4th, 6th - pupils equal and reactive to light, visual fields full to confrontation, extraocular muscles intact, no nystagmus  5th - facial sensation symmetric  7th - facial strength symmetric  8th - hearing intact  9th - palate elevates symmetrically,  uvula midline  11th - shoulder shrug symmetric  12th - tongue protrusion midline  MOTOR:   normal bulk and tone, full strength in the BUE, BLE  SENSORY:   normal and symmetric to light touch,  temperature, vibration  COORDINATION:   finger-nose-finger, fine finger movements normal  REFLEXES:   deep tendon reflexes present and symmetric  GAIT/STATION:   narrow based gait    DIAGNOSTIC DATA (LABS, IMAGING, TESTING) - I reviewed patient records, labs, notes, testing and imaging myself where available.  Lab Results  Component Value Date   WBC 7.2 01/22/2017   HGB 13.9 01/22/2017   HCT 41.2 01/22/2017   MCV 92.8 01/22/2017   PLT 280.0 01/22/2017      Component Value Date/Time   NA 141 01/18/2017 1020   K 4.5 01/18/2017 1020   CL 106 01/18/2017 1020   CO2 29 01/18/2017 1020   GLUCOSE 101 (H) 01/18/2017 1020   BUN 18 01/18/2017 1020   CREATININE 0.73 01/18/2017 1020   CALCIUM 9.8 01/18/2017 1020   PROT 6.9 01/18/2017 1020   ALBUMIN 4.0 01/18/2017 1020   AST 17 01/18/2017 1020   ALT 14 01/18/2017 1020   ALKPHOS 80 01/18/2017 1020   BILITOT 0.6 01/18/2017 1020   GFRNONAA 90 (L) 09/01/2013 0452   GFRAA >90 09/01/2013 0452   Lab Results  Component Value Date   CHOL 198 01/18/2017   HDL 52.30 01/18/2017   LDLCALC 113 (H) 01/18/2017   LDLDIRECT 129.8 10/01/2011   TRIG 164.0 (H) 01/18/2017   CHOLHDL 4 01/18/2017   No results found for: HGBA1C Lab Results  Component Value Date   VITAMINB12 428 01/22/2017   Lab Results  Component Value Date   TSH 5.74 (H) 01/22/2017    07/23/16 CT head [I reviewed images myself and agree with interpretation. Moderate frontal atrophy. -VRP]  - No evidence of acute intracranial abnormality. Atrophy with small vessel ischemic changes. - No evidence of maxillofacial  fracture.     ASSESSMENT AND PLAN  71 y.o. year old female here with gradual onset progressive cognitive difficulty, speech and language difficulty, flat  affect and emotional decline, concerning for neurodegenerative dementia.  Could represent frontotemporal dementia versus Alzheimer's dementia.  Dx:  1. Memory loss      PLAN:  I spent 60 minutes of face to face time with patient. Greater than 50% of time was spent in counseling and coordination of care with patient. In summary we discussed:   - check MRI brain - start memantine 10mg  at bedtime; then twice a day  - dementia resources given - caution with driving, safety, supervision  Orders Placed This Encounter  Procedures  . MR BRAIN WO CONTRAST   Meds ordered this encounter  Medications  . memantine (NAMENDA) 10 MG tablet    Sig: Take 1 tablet (10 mg total) by mouth 2 (two) times daily.    Dispense:  60 tablet    Refill:  12   Return in about 6 months (around 08/05/2017).   Penni Bombard, MD 05/10/2246, 25:00 AM Certified in Neurology, Neurophysiology and Neuroimaging  [facility] [address] [place], [place] [address] [phone]

## 2017-02-04 NOTE — Patient Instructions (Signed)

## 2017-02-13 ENCOUNTER — Telehealth: Payer: Self-pay | Admitting: Diagnostic Neuroimaging

## 2017-02-13 NOTE — Telephone Encounter (Signed)
Pt's husband called said their daughter is concerned about her mother driving. He said she's gotten lost only once prior to being seen at the clinic. He said the pt is aware he calling regarding her driving. Please call to advise

## 2017-02-14 ENCOUNTER — Telehealth: Payer: Self-pay | Admitting: Family Medicine

## 2017-02-14 NOTE — Telephone Encounter (Signed)
Pt requesting rx tussionex. Call when ready for pick up. Pt is out of medication. Last printed # 150 ml on 01/14/17. Annual visit 01/25/17.

## 2017-02-14 NOTE — Telephone Encounter (Signed)
Copied from [place] [phone]. Topic: Quick Communication - See Telephone Encounter >> Feb 14, 2017  8:51 AM Oneta Rack wrote: CRM for notification. See Telephone encounter for:   02/14/17.  Relation to pt: self Call back number: [phone] [id number] [place] [place] [address] [phone] (Phone) [phone] (Fax)     Reason for call:  Patient requesting chlorpheniramine-HYDROcodone Amanda Cockayne [facility]) 10-8 MG/5ML SURE, informed patient please allow 48 to 72 hour turn around, patient states she completely out, please advise

## 2017-02-15 ENCOUNTER — Ambulatory Visit
Admission: RE | Admit: 2017-02-15 | Discharge: 2017-02-15 | Disposition: A | Discharge status: Home / Self Care | Payer: 59 | Source: Ambulatory Visit | Attending: Diagnostic Neuroimaging | Admitting: Diagnostic Neuroimaging

## 2017-02-15 DIAGNOSIS — R413 Other amnesia: Secondary | ICD-10-CM | POA: Diagnosis not present

## 2017-02-15 MED ORDER — HYDROCOD POLST-CPM POLST ER 10-8 MG/5ML PO SUER: ORAL | 0 refills | Status: DC

## 2017-02-15 NOTE — Telephone Encounter (Signed)
Printed. Thanks.  Indication for chronic opioid:  Refractory cough Medication and dose: tussionex 10/8mg /81mL 179mL per month Last UDS date: 10/30/13 Pain contract signed (Y/N):  yes Date narcotic database last reviewed (include red flags): 12/11/16

## 2017-02-15 NOTE — Telephone Encounter (Signed)
I consulted with Dr. Leta Baptist and he advised that the best thing to do is secure the car so that she cannot drive. I left a detailed message for [name] making her aware of this and to make sure that she goes for her MRI this evening and once we have those results, we will call and plan the next steps.

## 2017-02-15 NOTE — Telephone Encounter (Signed)
Patient advised.  Rx left at front desk for pick up. 

## 2017-02-20 ENCOUNTER — Telehealth: Payer: Self-pay | Admitting: *Deleted

## 2017-02-20 ENCOUNTER — Telehealth: Payer: Self-pay | Admitting: Diagnostic Neuroimaging

## 2017-02-20 NOTE — Telephone Encounter (Signed)
LVM for husband, on DPR informing him his wife's MRI brain showed atrophy and chronic small vessel ischemic disease. Advised these can be age related, or related to other medical conditions. Advised him otherwise it showed unremarkable imaging results.  Advised he call for any questions.

## 2017-02-20 NOTE — Telephone Encounter (Signed)
Pts daughter called regarding memantine (NAMENDA) 10 MG tablet She said that the pt had only been taking one a day but realized on the bottle it says take one twice a day and they are just wanting to know if its fine to switch her to the twice a day or if pt should stay at one a day

## 2017-02-20 NOTE — Telephone Encounter (Signed)
I spoke with husband Noral.  Pt. just saw Dr. Mamie Nick on 02/04/18, and was started on Namenda. She is tolerating once daily well, so I have explained it is ok to go to bid dosing, as rx'd.  He verbalized understanding of same/fim

## 2017-02-20 NOTE — Telephone Encounter (Signed)
Patient's husband is returning your call. He would like his daughter [name] to be called back at [phone] instead of calling him.

## 2017-02-20 NOTE — Telephone Encounter (Signed)
Spoke with daughter, [name] on [place] as patient's husband requested.  Gave her Dr Gladstone Lighter result note. Discussed with her that [place] will not prevent memory loss or reverse it, but hopefully slow it down. Advised someone manage medications.  Daughter stated that she and her father are realizing that needs to be done. Discussed them using resource information they were provided to gain knowledge and assistance as needed with patient. Advised she call with any questions, concerns prior to FU in June.  Daughter verbalized understanding,  appreciation of call.

## 2017-03-08 ENCOUNTER — Other Ambulatory Visit: Payer: Self-pay | Admitting: Family Medicine

## 2017-03-26 ENCOUNTER — Other Ambulatory Visit: Payer: Self-pay | Admitting: Family Medicine

## 2017-03-26 DIAGNOSIS — Z79891 Long term (current) use of opiate analgesic: Secondary | ICD-10-CM

## 2017-03-26 NOTE — Telephone Encounter (Signed)
Requesting rx tussionex; last refilled # 150 ml on 02/15/17; last seen [facility]/25/17[facility] [place]. Pt request cb when refilled.

## 2017-03-26 NOTE — Telephone Encounter (Signed)
Copied from [place] [id number]. Topic: General - Other >> Mar 26, 2017 11:17 AM Neva Seat wrote: Tussionex 5 mg  Pt needing refill on the above Rx. Please call pt when ready for pickup.

## 2017-03-26 NOTE — Telephone Encounter (Signed)
Call her on [phone]

## 2017-03-27 MED ORDER — HYDROCOD POLST-CPM POLST ER 10-8 MG/5ML PO SUER: ORAL | 0 refills | Status: DC

## 2017-03-27 NOTE — Telephone Encounter (Signed)
Sent. Thanks.   

## 2017-05-27 ENCOUNTER — Telehealth: Payer: Self-pay | Admitting: *Deleted

## 2017-05-27 MED ORDER — MEMANTINE HCL 10 MG PO TABS: 10.0000 mg | ORAL_TABLET | Freq: Two times a day (BID) | ORAL | 1 refills | Status: DC

## 2017-05-27 NOTE — Telephone Encounter (Signed)
Refilled namenda for 90 day supply, 1 refill per patient's request. She has follow up in June.

## 2017-07-18 ENCOUNTER — Other Ambulatory Visit: Payer: Self-pay | Admitting: Family Medicine

## 2017-08-06 ENCOUNTER — Ambulatory Visit: Payer: Medicare Other | Admitting: Diagnostic Neuroimaging

## 2017-08-06 ENCOUNTER — Encounter: Payer: Self-pay | Admitting: Diagnostic Neuroimaging

## 2017-08-06 VITALS — BP 127/71 | HR 95 | Ht 62.5 in | Wt 184.4 lb

## 2017-08-06 DIAGNOSIS — F0390 Unspecified dementia without behavioral disturbance: Secondary | ICD-10-CM | POA: Diagnosis not present

## 2017-08-06 DIAGNOSIS — F03B0 Unspecified dementia, moderate, without behavioral disturbance, psychotic disturbance, mood disturbance, and anxiety: Secondary | ICD-10-CM

## 2017-08-06 MED ORDER — MEMANTINE HCL 10 MG PO TABS: 10.0000 mg | ORAL_TABLET | Freq: Two times a day (BID) | ORAL | 4 refills | Status: DC

## 2017-08-06 MED ORDER — DONEPEZIL HCL 5 MG PO TABS: 5.0000 mg | ORAL_TABLET | Freq: Every day | ORAL | 0 refills | Status: DC

## 2017-08-06 MED ORDER — DONEPEZIL HCL 10 MG PO TABS: 10.0000 mg | ORAL_TABLET | Freq: Every day | ORAL | 12 refills | Status: DC

## 2017-08-06 NOTE — Progress Notes (Signed)
[facility]  PATIENT: Alice Smith DOB: 1945-10-23  REFERRING CLINICIAN: Orlinda Blalock, MD HISTORY FROM: patient, husband REASON FOR VISIT: FOLLOW UP   HISTORICAL  CHIEF COMPLAINT:  Chief Complaint  Patient presents with  . Memory Loss    rm 6, husband- Noral  MMSE 19  . Follow-up    6 month    HISTORY OF PRESENT ILLNESS:   UPDATE (08/06/17, VRP): Since last visit, doing slightly worse. Tolerating memantine. No alleviating or aggravating factors. Driving less now. Some diff with shopping. MMSE 19/30. Ron Parker 6/6. [place] IADL 5/8.   PRIOR HPI (02/04/17): 72 year old left-handed female here for evaluation of cognitive decline, confusion, lack of emotions, difficulty processing information.  Family has noted at least 2 years of gradual onset progressive confusion, fatigue, decreased processing speed, comprehension difficulty, decreased interest in day-to-day activity specifically household chores and cleanliness, which she used to be quite involved with.  She is also shown flat and emotional affect, less nurturing, less social.  She has had trouble with maintaining her checkbook, finances, shopping and ordering.  In June 2018 she got lost in [place] when trying to pick up their grandson.  Overall patient having decreased speech output and fluency.  Patient also underwent 7 or 8 surgeries in 2014, and family noted that many symptoms started at that time.   REVIEW OF SYSTEMS: Full 14 system review of systems performed and negative with exception of: as per HPI.    ALLERGIES: Allergies  Allergen Reactions  . Clarithromycin     REACTION: nausea and vomiting  . Erythromycin Ethylsuccinate     REACTION: GI upset  . Fluticasone-Salmeterol     REACTION: Sore tongue and rash  . Procaine Hcl     REACTION: swelling  . Sulfonamide Derivatives     REACTION: rash  . Topiramate     REACTION: confusion    HOME MEDICATIONS: Outpatient Medications Prior to  Visit  Medication Sig Dispense Refill  . alendronate (FOSAMAX) 70 MG tablet TAKE 1 TABLET BY MOUTH EVERY 7 DAYS. TAKE WITH A FULL GLASS OF WATER ON AN EMPTY STOMACH 13 tablet 1  . aspirin 81 MG tablet Take 81 mg by mouth daily.    . cholecalciferol (VITAMIN D) 1000 units tablet Take 1 tablet (1,000 Units total) by mouth every other day.    Marland Kitchen dextromethorphan-guaiFENesin (MUCINEX DM) 30-600 MG 12hr tablet Take 1 tablet by mouth daily.    . fexofenadine-pseudoephedrine (ALLEGRA-D) 60-120 MG per tablet Take 1 tablet by mouth as needed.    . fluticasone (FLONASE) 50 MCG/ACT nasal spray Place 2 sprays into both nostrils daily.    . memantine (NAMENDA) 10 MG tablet Take 1 tablet (10 mg total) by mouth 2 (two) times daily. 180 tablet 1  . metoprolol succinate (TOPROL-XL) 50 MG 24 hr tablet TAKE 1 TABLET BY MOUTH EVERY DAY 90 tablet 1  . Multiple Vitamin (MULTIVITAMIN) capsule Take 1 capsule by mouth daily.    . NON FORMULARY Mega Red daily.    Marland Kitchen OVER THE COUNTER MEDICATION Cognium - 1 a day    . OVER THE COUNTER MEDICATION Collagen Peptides - 1 scoop at night.    Marland Kitchen OVER THE COUNTER MEDICATION Ker4din  -  1 drop on nail    . Probiotic Product (PROBIOTIC-10 PO) Take by mouth.    . promethazine (PHENERGAN) 25 MG tablet Take 1 tablet (25 mg total) by mouth every 6 (six) hours as needed for nausea or vomiting. 30 tablet 0  .  SUPER B COMPLEX/C PO Take by mouth daily.    Marland Kitchen UNABLE TO FIND Med Name: Hemp 500 mg 1 drop daily    . chlorpheniramine-HYDROcodone (TUSSIONEX PENNKINETIC ER) 10-8 MG/5ML SUER Take 5 mLs by mouth at bedtime as needed for cough (Patient not taking: Reported on 08/06/2017) 150 mL 0   No facility-administered medications prior to visit.     PAST MEDICAL HISTORY: Past Medical History:  Diagnosis Date  . Arthritis   . Breast mass 05/2001   benign  . Complication of anesthesia    slow to wake up x 1  . Concussion 2014, 05/2016  . Dry cough    at night  . Hemorrhoids   . Migraine      evaluated with Dr. Domingo Cocking 11/2009  . Osteoporosis     PAST SURGICAL HISTORY: Past Surgical History:  Procedure Laterality Date  . ABDOMINAL EXPLORATION SURGERY     for twisted bowel  . ABDOMINAL HYSTERECTOMY     yrs ago  . BREAST SURGERY  1989   aspirated lump left breast  . CARPAL TUNNEL RELEASE     right 03/2001, left 07/2001  . ESOPHAGOGASTRODUODENOSCOPY    . KNEE ARTHROSCOPY     rt knee  . nissenfundiplication    . PARTIAL HYSTERECTOMY  1989   dyspasia, ovaries left in  . PARTIAL KNEE ARTHROPLASTY Right 06/23/2013   Procedure: RIGHT KNEE MEDIAL UNICOMPARTMENTAL ARTHROPLASTY;  Surgeon: Mauri Pole, MD;  Location: [facility];  Service: Orthopedics;  Laterality: Right;  . PARTIAL KNEE ARTHROPLASTY Left 08/31/2013   Procedure: LEFT UNICOMPARTMENTAL KNEE ATHROPLASTY   (MEDIAL) ;  Surgeon: Mauri Pole, MD;  Location: [facility];  Service: Orthopedics;  Laterality: Left;  . ROTATOR CUFF REPAIR     right 03/2001, left 07/2001    FAMILY HISTORY: Family History  Problem Relation Age of Onset  . Arthritis Mother   . Cancer Mother        lung cancer, dx'd in her 28s  . Cancer Father        throat  . COPD Brother   . Cancer Sister        breast  . Breast cancer Sister   . Colon cancer Neg Hx     SOCIAL HISTORY:  Social History   Socioeconomic History  . Marital status: Married    Spouse name: Not on file  . Number of children: 3  . Years of education: Not on file  . Highest education level: Not on file  Occupational History  . Occupation: home  Social Needs  . Financial resource strain: Not on file  . Food insecurity:    Worry: Not on file    Inability: Not on file  . Transportation needs:    Medical: Not on file    Non-medical: Not on file  Tobacco Use  . Smoking status: Former Smoker    Packs/day: 1.00    Years: 5.00    Pack years: 5.00    Types: Cigarettes    Last attempt to quit: 06/17/1978    Years since quitting: 39.1  . Smokeless tobacco: Never Used  .  Tobacco comment: 5 pack year history  Substance and Sexual Activity  . Alcohol use: No  . Drug use: No  . Sexual activity: Not on file  Lifestyle  . Physical activity:    Days per week: Not on file    Minutes per session: Not on file  . Stress: Not on file  Relationships  .  Social connections:    Talks on phone: Not on file    Gets together: Not on file    Attends religious service: Not on file    Active member of club or organization: Not on file    Attends meetings of clubs or organizations: Not on file    Relationship status: Not on file  . Intimate partner violence:    Fear of current or ex partner: Not on file    Emotionally abused: Not on file    Physically abused: Not on file    Forced sexual activity: Not on file  Other Topics Concern  . Not on file  Social History Narrative   Second marriage, 5. Has 3 adult children, adopted 4, 6 grandkids.   From [place], [place]   Education- GED, some college     PHYSICAL EXAM  GENERAL EXAM/CONSTITUTIONAL: Vitals:  Vitals:   08/06/17 1048  BP: 127/71  Pulse: 95  Weight: 184 lb 6.4 oz (83.6 kg)  Height: 5' 2.5" (1.588 m)   Body mass index is 33.19 kg/m. No exam data present  Patient is in no distress; well developed, nourished and groomed; neck is supple  CARDIOVASCULAR:  Examination of carotid arteries is normal; no carotid bruits  Regular rate and rhythm, no murmurs  Examination of peripheral vascular system by observation and palpation is normal  EYES:  Ophthalmoscopic exam of optic discs and posterior segments is normal; no papilledema or hemorrhages  MUSCULOSKELETAL:  Gait, strength, tone, movements noted in Neurologic exam below  NEUROLOGIC: MENTAL STATUS:  MMSE - [place] Exam 08/06/2017 02/04/2017  Orientation to time 4 5  Orientation to Place 3 5  Registration 3 3  Attention/ Calculation 0 0  Recall 1 2  Language- name 2 objects 2 2  Language- repeat 1 0  Language- follow 3 step  command 3 3  Language- read & follow direction 1 1  Write a sentence 0 1  Write a sentence-comments No subject -  Copy design 1 1  Total score 19 23    awake, alert, oriented to person and time; NOT CITY  DECR memory  DECR attention and concentration  DECR FLUENCY; comprehension intact, naming intact,   fund of knowledge appropriate  FLAT AFFECT  CRANIAL NERVE:   2nd - no papilledema on fundoscopic exam  2nd, 3rd, 4th, 6th - pupils equal and reactive to light, visual fields full to confrontation, extraocular muscles intact, no nystagmus  5th - facial sensation symmetric  7th - facial strength symmetric  8th - hearing intact  9th - palate elevates symmetrically, uvula midline  11th - shoulder shrug symmetric  12th - tongue protrusion midline  MOTOR:   normal bulk and tone, full strength in the BUE, BLE  SENSORY:   normal and symmetric to light touch,  temperature, vibration  COORDINATION:   finger-nose-finger, fine finger movements normal  REFLEXES:   deep tendon reflexes present and symmetric  GAIT/STATION:   narrow based gait    DIAGNOSTIC DATA (LABS, IMAGING, TESTING) - I reviewed patient records, labs, notes, testing and imaging myself where available.  Lab Results  Component Value Date   WBC 7.2 01/22/2017   HGB 13.9 01/22/2017   HCT 41.2 01/22/2017   MCV 92.8 01/22/2017   PLT 280.0 01/22/2017      Component Value Date/Time   NA 141 01/18/2017 1020   K 4.5 01/18/2017 1020   CL 106 01/18/2017 1020   CO2 29 01/18/2017 1020   GLUCOSE 101 (  H) 01/18/2017 1020   BUN 18 01/18/2017 1020   CREATININE 0.73 01/18/2017 1020   CALCIUM 9.8 01/18/2017 1020   PROT 6.9 01/18/2017 1020   ALBUMIN 4.0 01/18/2017 1020   AST 17 01/18/2017 1020   ALT 14 01/18/2017 1020   ALKPHOS 80 01/18/2017 1020   BILITOT 0.6 01/18/2017 1020   GFRNONAA 90 (L) 09/01/2013 0452   GFRAA >90 09/01/2013 0452   Lab Results  Component Value Date   CHOL 198 01/18/2017    HDL 52.30 01/18/2017   LDLCALC 113 (H) 01/18/2017   LDLDIRECT 129.8 10/01/2011   TRIG 164.0 (H) 01/18/2017   CHOLHDL 4 01/18/2017   No results found for: HGBA1C Lab Results  Component Value Date   VITAMINB12 428 01/22/2017   Lab Results  Component Value Date   TSH 5.74 (H) 01/22/2017    07/23/16 CT head [I reviewed images myself and agree with interpretation. Moderate frontal atrophy. -VRP]  - No evidence of acute intracranial abnormality. Atrophy with small vessel ischemic changes. - No evidence of maxillofacial fracture.  02/15/17 MRI brain (without) demonstrating: 1. Moderate frontal and perisylvian atrophy. 2. Mild periventricular and subcortical foci of non-specific gliosis.  3. No acute findings.    ASSESSMENT AND PLAN  72 y.o. year old female here with gradual onset progressive cognitive difficulty, speech and language difficulty, flat affect and emotional decline, concerning for neurodegenerative dementia.  Could represent frontotemporal dementia versus Alzheimer's dementia.  Dx:  1. Moderate dementia without behavioral disturbance      PLAN:  I spent 25 minutes of face to face time with patient. Greater than 50% of time was spent in counseling and coordination of care with patient. In summary we discussed:   MILD-MODERATE DEMENTIA - continue memantine 10mg  twice a day  - start donepezil 5mg  daily x 1 month; then increase to 10mg  daily - dementia resources reviewed - no driving; increase safety and supervision - stay active and exercise  Meds ordered this encounter  Medications  . memantine (NAMENDA) 10 MG tablet    Sig: Take 1 tablet (10 mg total) by mouth 2 (two) times daily.    Dispense:  180 tablet    Refill:  4  . donepezil (ARICEPT) 5 MG tablet    Sig: Take 1 tablet (5 mg total) by mouth at bedtime.    Dispense:  30 tablet    Refill:  0  . donepezil (ARICEPT) 10 MG tablet    Sig: Take 1 tablet (10 mg total) by mouth at bedtime.    Dispense:   30 tablet    Refill:  12   Return in about 1 year (around 08/07/2018) for with NP.   Penni Bombard, MD 08/12/7626, 31:51 AM Certified in Neurology, Neurophysiology and Neuroimaging  [facility] [address] [place], [place] [address] [phone]

## 2017-09-10 ENCOUNTER — Other Ambulatory Visit: Payer: Self-pay | Admitting: Family Medicine

## 2018-01-08 ENCOUNTER — Other Ambulatory Visit: Payer: Self-pay | Admitting: Family Medicine

## 2018-01-08 NOTE — Telephone Encounter (Signed)
Electronic refill request Last office visit 02/04/17 Last refill 07/23/17 Please advise when patient needs to schedule CPE

## 2018-01-09 ENCOUNTER — Encounter: Payer: Self-pay | Admitting: *Deleted

## 2018-01-09 NOTE — Telephone Encounter (Signed)
Sent.  Needs AMW and f/u visit when possible.

## 2018-01-09 NOTE — Telephone Encounter (Signed)
Letter mailed

## 2018-01-22 ENCOUNTER — Encounter: Payer: Self-pay | Admitting: Family Medicine

## 2018-01-22 ENCOUNTER — Ambulatory Visit (INDEPENDENT_AMBULATORY_CARE_PROVIDER_SITE_OTHER)
Admission: RE | Admit: 2018-01-22 | Discharge: 2018-01-22 | Disposition: A | Discharge status: Home / Self Care | Payer: Medicare Other | Source: Ambulatory Visit | Attending: Family Medicine | Admitting: Family Medicine

## 2018-01-22 ENCOUNTER — Ambulatory Visit (INDEPENDENT_AMBULATORY_CARE_PROVIDER_SITE_OTHER): Payer: Medicare Other | Admitting: Family Medicine

## 2018-01-22 VITALS — BP 124/68 | HR 56 | Temp 98.5°F | Ht 62.0 in | Wt 181.5 lb

## 2018-01-22 DIAGNOSIS — R05 Cough: Secondary | ICD-10-CM

## 2018-01-22 DIAGNOSIS — R059 Cough, unspecified: Secondary | ICD-10-CM

## 2018-01-22 MED ORDER — FAMOTIDINE 20 MG PO TABS: 20.0000 mg | ORAL_TABLET | Freq: Two times a day (BID) | ORAL | Status: DC

## 2018-01-22 NOTE — Patient Instructions (Addendum)
Go to the lab on the way out.  We'll contact you with your xray report.  Use promethazine for cough.  In case this is related to heartburn, the try taking pepcid twice a day and skip the fosamax for 1-2 weeks.   Update me as needed.    Take care.  Glad to see you.

## 2018-01-22 NOTE — Progress Notes (Signed)
3 weeks of cough.  No sputum.  Most at night.  No wheeze.  She doesn't feel ill, but she keeps coughing. No ear pain, rhinorrhea, ST.  Cough is slowly getting better per patient report.  Family noted the cough and wanted her to get checked.  No fevers.  Not SOB.  She has occasional heartburn.  She still taking Fosamax at baseline.  Last night was some better.    Her oldest son died of a CVA in 2018-01-03.  I offered my condolences.  Meds, vitals, and allergies reviewed.   ROS: Per HPI unless specifically indicated in ROS section   GEN: nad, alert and oriented HEENT: mucous membranes moist, tympanic membranes within normal's bilaterally.  Nasal exam without erythema.  Oropharynx within normal limits NECK: supple w/o LA CV: rrr.  PULM: ctab, no inc wob ABD: soft, +bs EXT: no edema SKIN: Well-perfused. Dry cough noted.

## 2018-01-23 NOTE — Assessment & Plan Note (Signed)
Her lungs are clear.  Last night was some better.  Cough is dry.  No fevers.  She could have a postinfectious process and if so this cough should gradually get better.  Reasonable to check chest x-ray given the duration of the cough.  See notes on imaging.  She could have GERD related cough.  She is taking Fosamax at baseline and she has significant stressors recently noted with her son dying.  Reasonable to try Pepcid in the meantime on the outside chance this is GERD related.  She can hold Fosamax for 1 to 2 weeks in the meantime.  She can take Tessalon given her history of allergies with other medications, so it would be reasonable to try Phenergan at night to see if that helps control the cough.  Update me as needed.  She agrees.  Again offered my condolences about her son.

## 2018-02-09 ENCOUNTER — Encounter: Payer: Self-pay | Admitting: Gastroenterology

## 2018-02-28 IMAGING — MG 2D DIGITAL SCREENING BILATERAL MAMMOGRAM WITH CAD AND ADJUNCT TO
8 of 12 series · 8 of 28 positions shown · non-contrast
Comparison: Previous exam(s).

CLINICAL DATA: Screening.

EXAM:
2D DIGITAL SCREENING BILATERAL MAMMOGRAM WITH CAD AND ADJUNCT TOMO

[R MLO synth-2D]
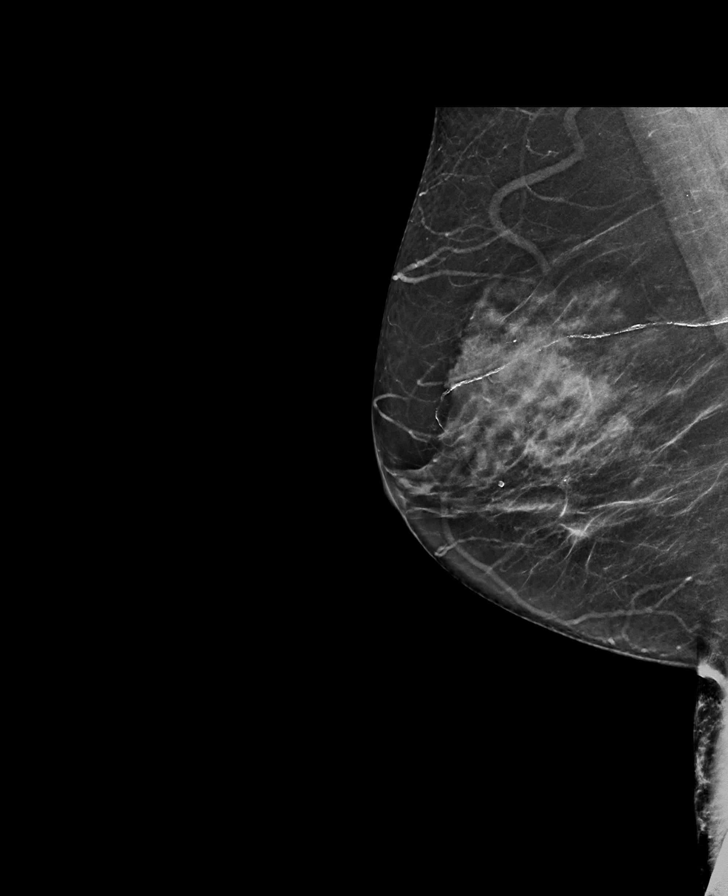

[L MLO]
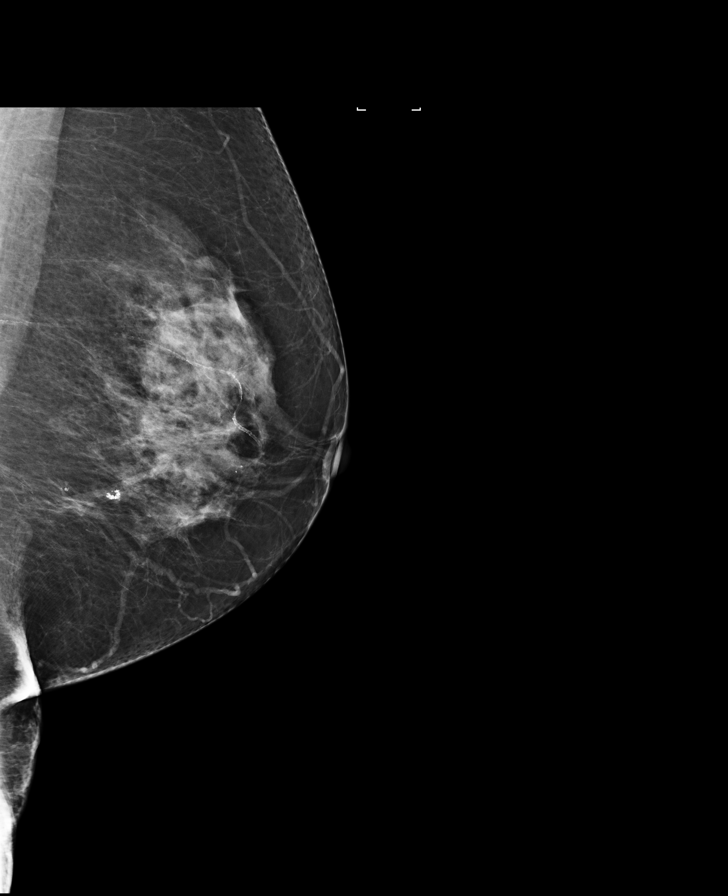

[L CC]
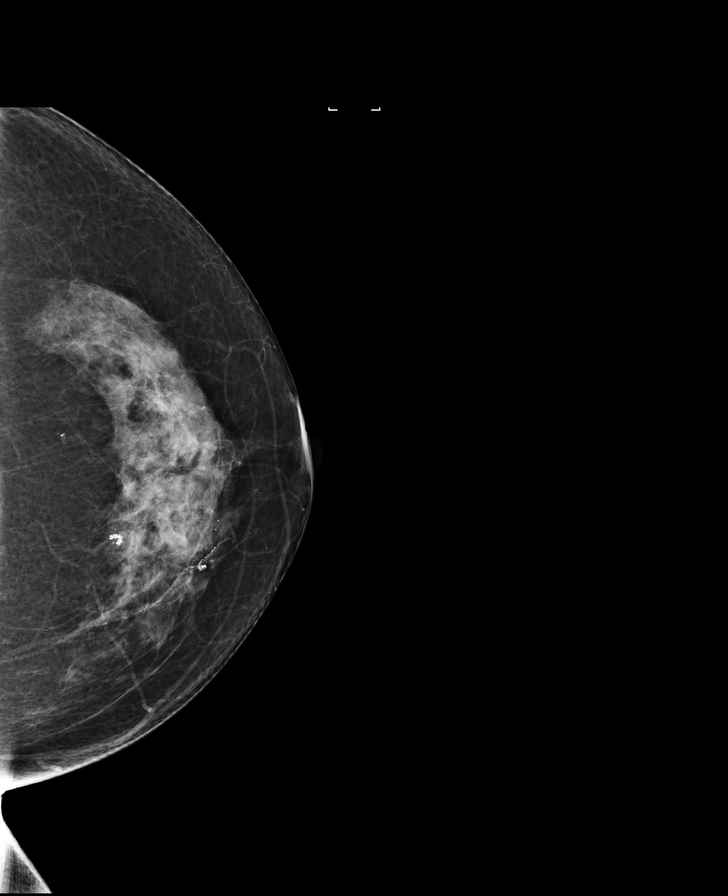

[R CC]
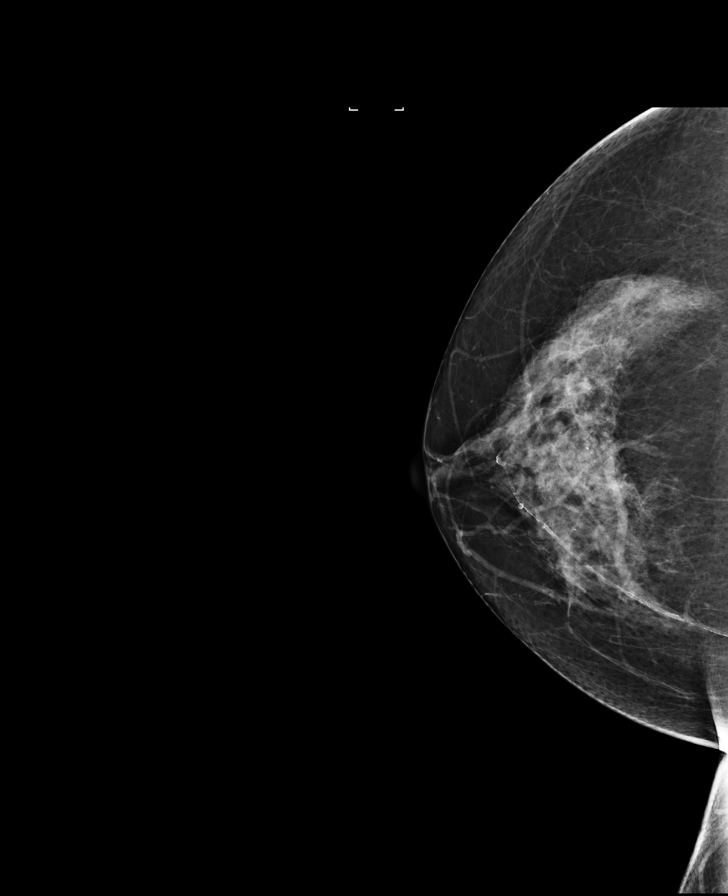

[L CC synth-2D]
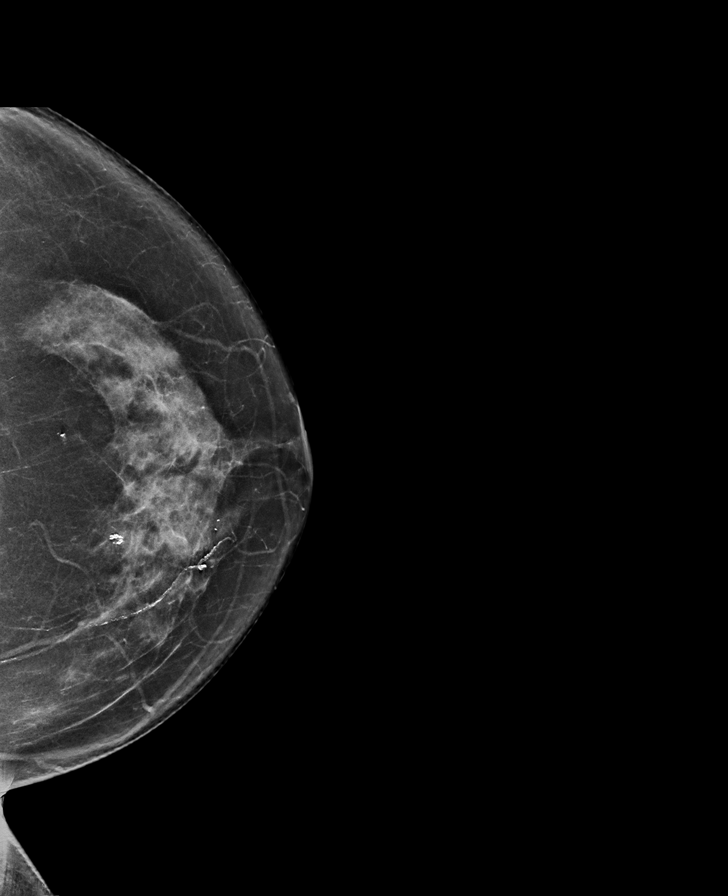

[L MLO synth-2D]
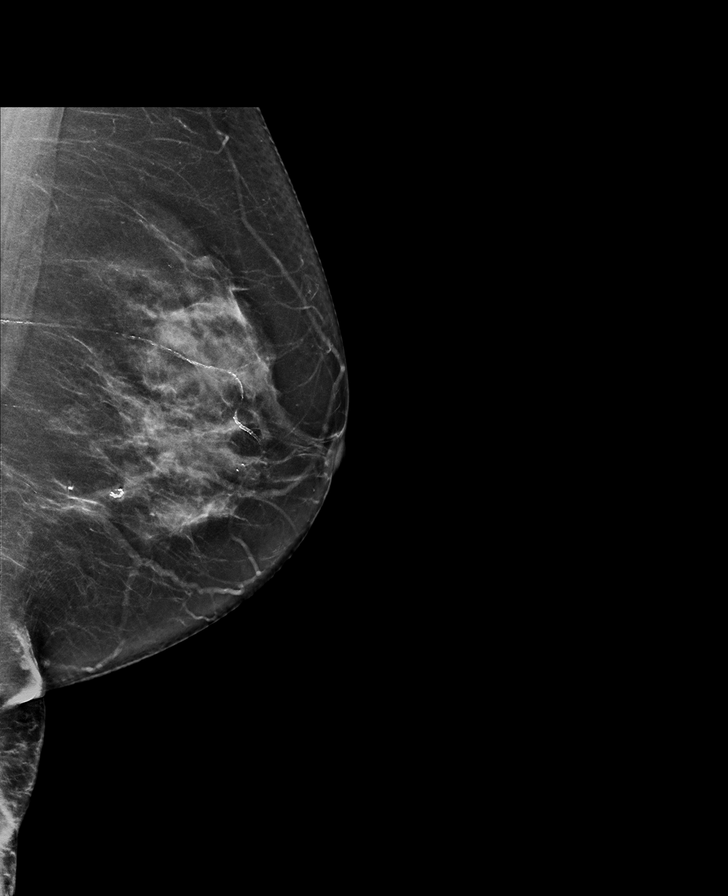

[R CC synth-2D]
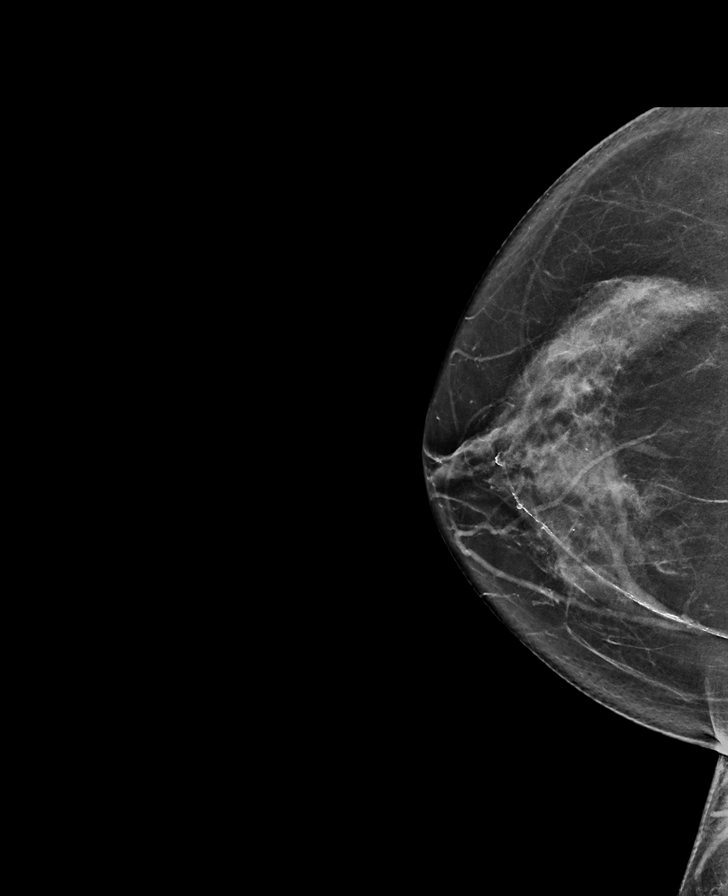

[R MLO]
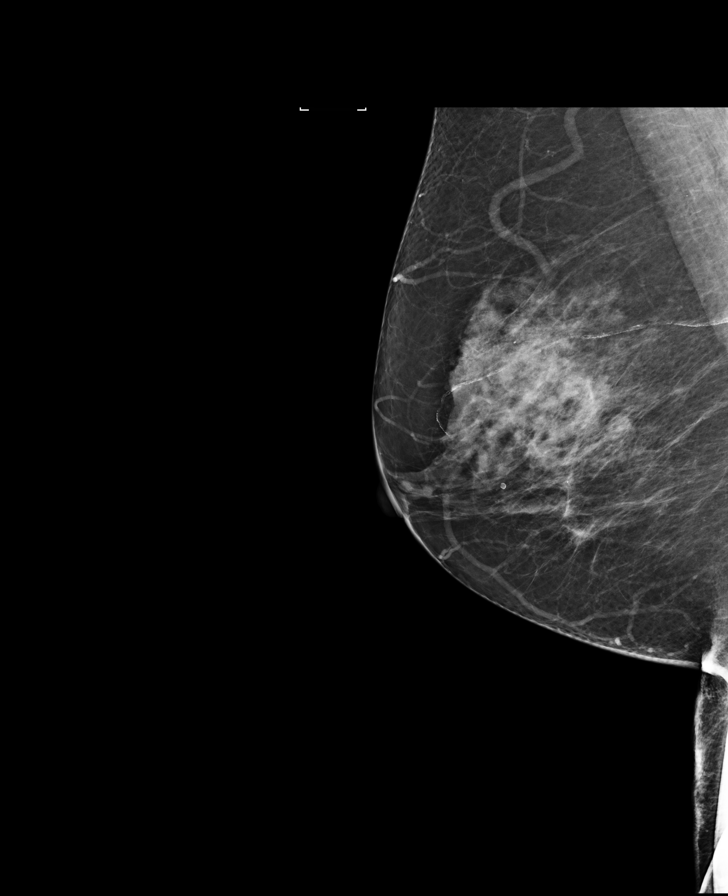

[8 of 28 positions shown; findings below may reference images not displayed]

ACR Breast Density Category c: The breast tissue is heterogeneously
dense, which may obscure small masses.
FINDINGS: There are no findings suspicious for malignancy. Images were
processed with CAD.
IMPRESSION: No mammographic evidence of malignancy. A result letter of this
screening mammogram will be mailed directly to the patient.

RECOMMENDATION:
Screening mammogram in one year. (Code:[id number]-[id number])

BI-RADS CATEGORY  1: Negative.

## 2018-03-11 DIAGNOSIS — L812 Freckles: Secondary | ICD-10-CM | POA: Diagnosis not present

## 2018-03-11 DIAGNOSIS — D229 Melanocytic nevi, unspecified: Secondary | ICD-10-CM | POA: Diagnosis not present

## 2018-03-11 DIAGNOSIS — L821 Other seborrheic keratosis: Secondary | ICD-10-CM | POA: Diagnosis not present

## 2018-03-11 DIAGNOSIS — Z1283 Encounter for screening for malignant neoplasm of skin: Secondary | ICD-10-CM | POA: Diagnosis not present

## 2018-03-11 DIAGNOSIS — L578 Other skin changes due to chronic exposure to nonionizing radiation: Secondary | ICD-10-CM | POA: Diagnosis not present

## 2018-03-19 ENCOUNTER — Other Ambulatory Visit: Payer: Self-pay | Admitting: Family Medicine

## 2018-03-19 NOTE — Telephone Encounter (Signed)
Last office visit 01/22/2018 for cough.   Last CPE 01/25/2017.  Last refilled 09/12/2017 for #90 with 1 refill.   No future appointments.  Ok to refill?

## 2018-03-19 NOTE — Telephone Encounter (Signed)
Sent.  Please schedule CPE for the spring when flu season is over. Thanks.

## 2018-03-20 ENCOUNTER — Encounter: Payer: Self-pay | Admitting: *Deleted

## 2018-03-20 NOTE — Telephone Encounter (Signed)
Letter mailed

## 2018-04-17 ENCOUNTER — Encounter: Payer: Self-pay | Admitting: Family Medicine

## 2018-06-12 ENCOUNTER — Other Ambulatory Visit: Payer: Self-pay

## 2018-06-12 NOTE — Telephone Encounter (Signed)
[facility] Night - Client Nonclinical Telephone Record [facility] Night - Client Client Site [facility] Physician Renford Dills - MD Contact Type Call Who Is Calling Physician / Provider / Hospital Call Type Provider Call Message Only Reason for Call Request to send message to Office Initial Comment Caller states she is following up on 3 rx Additional Comment Caller is Ellin Goodie calling from [employer] who can be reached at [phone]. Pt. is Alice Smith with dob October 18, 1945. Ref# [id number]. Call Closed By: Mable Paris Transaction Date/Time: 06/11/2018 5:36:41 PM (ET)

## 2018-06-12 NOTE — Telephone Encounter (Signed)
[facility] is calling to request refills on alendronate, donepezil and metoprolol for patient.  Last refills: Metoprolol #90, 1 refill on 03/19/18 Donepezil #30, 12 refills on 08/06/17 Alendronate #12, 1 refill on 01/09/18   Last OV: 01/22/18 for cough Prior to that was seen for memory changes on 01/25/17  Appears past due for annual.  Has appointment set up for annual exam on 07/25/18  Patient will be out of the alendronate prior to 07/25/18 appointment and will be cutting it close with the others as she gets these through mail order.   Will pend for Dr. Josefine Class approval if okay to go ahead and fill.

## 2018-06-13 MED ORDER — ALENDRONATE SODIUM 70 MG PO TABS: ORAL_TABLET | ORAL | 1 refills | Status: DC

## 2018-06-13 MED ORDER — METOPROLOL SUCCINATE ER 50 MG PO TB24: 50.0000 mg | ORAL_TABLET | Freq: Every day | ORAL | 1 refills | Status: DC

## 2018-06-13 NOTE — Telephone Encounter (Signed)
I sent prescription for alendronate and metoprolol.  I will route this over to Dr. Leta Baptist regarding donepezil.  I appreciate the help of all involved.

## 2018-06-16 MED ORDER — DONEPEZIL HCL 10 MG PO TABS: 10.0000 mg | ORAL_TABLET | Freq: Every day | ORAL | 12 refills | Status: DC

## 2018-06-26 ENCOUNTER — Other Ambulatory Visit: Payer: Self-pay | Admitting: Diagnostic Neuroimaging

## 2018-07-25 ENCOUNTER — Ambulatory Visit (INDEPENDENT_AMBULATORY_CARE_PROVIDER_SITE_OTHER): Payer: Medicare Other | Admitting: Family Medicine

## 2018-07-25 ENCOUNTER — Encounter: Payer: Self-pay | Admitting: Family Medicine

## 2018-07-25 ENCOUNTER — Other Ambulatory Visit: Payer: Self-pay

## 2018-07-25 VITALS — BP 140/72 | HR 61 | Temp 98.4°F | Ht 63.0 in | Wt 194.1 lb

## 2018-07-25 DIAGNOSIS — R05 Cough: Secondary | ICD-10-CM | POA: Diagnosis not present

## 2018-07-25 DIAGNOSIS — Z0000 Encounter for general adult medical examination without abnormal findings: Secondary | ICD-10-CM | POA: Diagnosis not present

## 2018-07-25 DIAGNOSIS — M810 Age-related osteoporosis without current pathological fracture: Secondary | ICD-10-CM | POA: Diagnosis not present

## 2018-07-25 DIAGNOSIS — E785 Hyperlipidemia, unspecified: Secondary | ICD-10-CM

## 2018-07-25 DIAGNOSIS — G43009 Migraine without aura, not intractable, without status migrainosus: Secondary | ICD-10-CM | POA: Diagnosis not present

## 2018-07-25 DIAGNOSIS — R413 Other amnesia: Secondary | ICD-10-CM

## 2018-07-25 DIAGNOSIS — R059 Cough, unspecified: Secondary | ICD-10-CM

## 2018-07-25 DIAGNOSIS — R7989 Other specified abnormal findings of blood chemistry: Secondary | ICD-10-CM | POA: Diagnosis not present

## 2018-07-25 DIAGNOSIS — Z7189 Other specified counseling: Secondary | ICD-10-CM

## 2018-07-25 DIAGNOSIS — E559 Vitamin D deficiency, unspecified: Secondary | ICD-10-CM

## 2018-07-25 DIAGNOSIS — K219 Gastro-esophageal reflux disease without esophagitis: Secondary | ICD-10-CM

## 2018-07-25 MED ORDER — ALENDRONATE SODIUM 70 MG PO TABS: ORAL_TABLET | ORAL | 3 refills | Status: DC

## 2018-07-25 MED ORDER — FAMOTIDINE 20 MG PO TABS: 20.0000 mg | ORAL_TABLET | Freq: Two times a day (BID) | ORAL | 1 refills | Status: DC

## 2018-07-25 MED ORDER — DONEPEZIL HCL 10 MG PO TABS: 10.0000 mg | ORAL_TABLET | Freq: Every day | ORAL | Status: DC

## 2018-07-25 MED ORDER — METOPROLOL SUCCINATE ER 50 MG PO TB24: 50.0000 mg | ORAL_TABLET | Freq: Every day | ORAL | 3 refills | Status: DC

## 2018-07-25 NOTE — Progress Notes (Signed)
I have personally reviewed the Medicare Annual Wellness questionnaire and have noted 1. The patient's medical and social history 2. Their use of alcohol, tobacco or illicit drugs 3. Their current medications and supplements 4. The patient's functional ability including ADL's, fall risks, home safety risks and hearing or visual             impairment. 5. Diet and physical activities 6. Evidence for depression or mood disorders  The patients weight, height, BMI have been recorded in the chart and visual acuity is per eye clinic.  I have made referrals, counseling and provided education to the patient based review of the above and I have provided the pt with a written personalized care plan for preventive services.  Provider list updated- see scanned forms.  Routine anticipatory guidance given to patient.  See health maintenance. The possibility exists that previously documented standard health maintenance information may have been brought forward from a previous encounter into this note.  If needed, that same information has been updated to reflect the current situation based on today's encounter.    Flu 2019 Shingles discussed with patient.  Shingrix out of stock. PNA up-to-date Tetanus 2016 Colon cancer screening deferred for now.  I want to talk to neurology first regarding her history of memory loss and the potential for sedation related to the procedure.  Patient and husband agree. Breast cancer screening-see after visit summary. Bone density test previously done but not recently.  History of osteoporosis.  Ordered. Advance directive- Husband or daughter [name] designated if patient were incapacitated.  Cognitive function addressed- see scanned forms- and if abnormal then additional documentation follows.   Dec migraines with metoprolol.   No adverse effect on medication.  Clearly with less headaches.  Able to tolerate medication.  Overall improved on medication.  Frequent throat  clearing and cough.   No fevers, no sputum.  History of chronic cough in the past with negative work-up.  She had previously taken hydrocodone at night for cough.  No recent use.  Memory loss noted.  She may be off aricept.  Husband will check on medications at home.  She didn't recall the reason but may have stopped in the meantime.  She has neuro f/u pending.  She has progressive memory loss with difficulty with short-term recall.  Husband had noted changes at home.  She is less active.  Husband does more the housework.  PMH and SH reviewed  Meds, vitals, and allergies reviewed.   ROS: Per HPI.  Unless specifically indicated otherwise in HPI, the patient denies:  General: fever. Eyes: acute vision changes ENT: sore throat Cardiovascular: chest pain Respiratory: SOB GI: vomiting GU: dysuria Musculoskeletal: acute back pain Derm: acute rash Neuro: acute motor dysfunction Psych: worsening mood Endocrine: polydipsia Heme: bleeding Allergy: hayfever  GEN: nad, alert and oriented to self but not to month.  She does know the year.  0 out of 3 on recall.  She can still read a watch. HEENT: mucous membranes moist NECK: supple w/o LA CV: rrr. PULM: ctab, no inc wob ABD: soft, +bs EXT: no edema SKIN: no acute rash

## 2018-07-25 NOTE — Patient Instructions (Addendum)
You can call for a mammogram at the [phone]  Go to the lab on the way out.  We'll contact you with your lab report.  I'll check with neurology prior to considering a colonoscopy.  I'll check on the bone density testing.   Try taking pepcid for the cough, in case GERD is contributing.   Take care.  Glad to see you.

## 2018-07-26 LAB — LIPID PANEL
Cholesterol: 238 mg/dL — ABNORMAL HIGH (ref <200)
HDL: 60 mg/dL (ref >=50)
LDL Cholesterol (Calc): 148 mg/dL (calc) — ABNORMAL HIGH
Non-HDL Cholesterol (Calc): 178 mg/dL (calc) — ABNORMAL HIGH (ref <130)
Total CHOL/HDL Ratio: 4 (calc) (ref <5.0)
Triglycerides: 160 mg/dL — ABNORMAL HIGH (ref <150)

## 2018-07-26 LAB — VITAMIN D 25 HYDROXY (VIT D DEFICIENCY, FRACTURES): Vit D, 25-Hydroxy: 33 ng/mL (ref 30–100)

## 2018-07-26 LAB — COMPREHENSIVE METABOLIC PANEL
AG Ratio: 1.4 (calc) (ref 1.0–2.5)
ALT: 13 U/L (ref 6–29)
AST: 15 U/L (ref 10–35)
Albumin: 4 g/dL (ref 3.6–5.1)
Alkaline phosphatase (APISO): 104 U/L (ref 37–153)
BUN: 17 mg/dL (ref 7–25)
CO2: 23 mmol/L (ref 20–32)
Calcium: 9.6 mg/dL (ref 8.6–10.4)
Chloride: 106 mmol/L (ref 98–110)
Creat: 0.81 mg/dL (ref 0.60–0.93)
Globulin: 2.9 g/dL (calc) (ref 1.9–3.7)
Glucose, Bld: 100 mg/dL — ABNORMAL HIGH (ref 65–99)
Potassium: 4.4 mmol/L (ref 3.5–5.3)
Sodium: 144 mmol/L (ref 135–146)
Total Bilirubin: 0.5 mg/dL (ref 0.2–1.2)
Total Protein: 6.9 g/dL (ref 6.1–8.1)

## 2018-07-26 LAB — TSH: TSH: 5.27 mIU/L — ABNORMAL HIGH (ref 0.40–4.50)

## 2018-07-27 DIAGNOSIS — R7989 Other specified abnormal findings of blood chemistry: Secondary | ICD-10-CM | POA: Insufficient documentation

## 2018-07-27 NOTE — Assessment & Plan Note (Signed)
She has progressive memory loss with difficulty with short-term recall.  Husband had noted changes at home.  She is less active.  Husband does more the housework.    No change in meds at this point.  Husband is going to make sure she is still taking donepezil.  I will await neurology input on follow-up.  I would especially appreciate neurology input on cautions regarding follow-up colonoscopy.

## 2018-07-27 NOTE — Assessment & Plan Note (Addendum)
Bone density ordered.  Continue current medications.  Vitamin D is not low.

## 2018-07-27 NOTE — Assessment & Plan Note (Signed)
Minimally elevated.  Stable.  No need for treatment.  We can recheck episodically.

## 2018-07-27 NOTE — Assessment & Plan Note (Signed)
Lipids are elevated but I would not change her meds at this point, especially since we need to first clarify her donepezil use.  Continue work on diet and exercise as tolerated.

## 2018-07-27 NOTE — Assessment & Plan Note (Signed)
Flu 2019 Shingles discussed with patient.  Shingrix out of stock. PNA up-to-date Tetanus 2016 Colon cancer screening deferred for now.  I want to talk to neurology first regarding her history of memory loss and the potential for sedation related to the procedure.  Patient and husband agree. Breast cancer screening-see after visit summary. Bone density test previously done but not recently.  History of osteoporosis.  Ordered. Advance directive- Husband or daughter [name] designated if patient were incapacitated.  Cognitive function addressed- see scanned forms- and if abnormal then additional documentation follows.

## 2018-07-27 NOTE — Assessment & Plan Note (Signed)
Advance directive- Husband or daughter [place] designated if patient were incapacitated.  

## 2018-07-27 NOTE — Assessment & Plan Note (Signed)
Dec migraines with metoprolol.   No adverse effect on medication.  Clearly with less headaches.  Able to tolerate medication.  Overall improved on medication.  Continue as is for now.

## 2018-07-27 NOTE — Assessment & Plan Note (Signed)
Frequent throat clearing and cough.   No fevers, no sputum.  History of chronic cough in the past with negative work-up.  She had previously taken hydrocodone at night for cough.  No recent use.  Would try Pepcid just to see if that makes any difference, in case this could be reflux related.  Lungs are clear.  Discussed.  All agree.

## 2018-07-29 ENCOUNTER — Telehealth: Payer: Self-pay

## 2018-07-29 NOTE — Telephone Encounter (Signed)
Patient advised of lab results. Left message for patient's husband to call back to be advised also per Dr. Damita Dunnings.

## 2018-07-30 ENCOUNTER — Other Ambulatory Visit: Payer: Self-pay | Admitting: Family Medicine

## 2018-07-30 DIAGNOSIS — Z1231 Encounter for screening mammogram for malignant neoplasm of breast: Secondary | ICD-10-CM

## 2018-08-07 ENCOUNTER — Telehealth: Payer: Self-pay

## 2018-08-07 NOTE — Telephone Encounter (Signed)
Spoke with the patient and they have given verbal consent to file insurance and to do a mychart video visit. Mychart video link has been sent to the patient.   

## 2018-08-08 ENCOUNTER — Ambulatory Visit: Payer: Medicare Other | Admitting: Family Medicine

## 2018-08-08 ENCOUNTER — Ambulatory Visit: Payer: Medicare Other | Admitting: Nurse Practitioner

## 2018-08-09 ENCOUNTER — Other Ambulatory Visit: Payer: Self-pay | Admitting: Diagnostic Neuroimaging

## 2018-08-11 ENCOUNTER — Telehealth (INDEPENDENT_AMBULATORY_CARE_PROVIDER_SITE_OTHER): Payer: Medicare Other | Admitting: Family Medicine

## 2018-08-11 ENCOUNTER — Encounter: Payer: Self-pay | Admitting: Family Medicine

## 2018-08-11 ENCOUNTER — Other Ambulatory Visit: Payer: Self-pay

## 2018-08-11 ENCOUNTER — Other Ambulatory Visit: Payer: Self-pay | Admitting: Family Medicine

## 2018-08-11 DIAGNOSIS — F03B0 Unspecified dementia, moderate, without behavioral disturbance, psychotic disturbance, mood disturbance, and anxiety: Secondary | ICD-10-CM

## 2018-08-11 DIAGNOSIS — F0390 Unspecified dementia without behavioral disturbance: Secondary | ICD-10-CM

## 2018-08-11 MED ORDER — MEMANTINE HCL 5 MG PO TABS: 5.0000 mg | ORAL_TABLET | Freq: Two times a day (BID) | ORAL | 2 refills | Status: DC

## 2018-08-11 MED ORDER — DONEPEZIL HCL 10 MG PO TABS: 10.0000 mg | ORAL_TABLET | Freq: Every day | ORAL | Status: DC

## 2018-08-11 MED ORDER — DONEPEZIL HCL 10 MG PO TABS: 10.0000 mg | ORAL_TABLET | Freq: Every day | ORAL | 3 refills | Status: DC

## 2018-08-11 NOTE — Progress Notes (Signed)
PATIENT: Alice Smith DOB: 1945/05/21  REASON FOR VISIT: follow up HISTORY FROM: patient  Virtual Visit via Telephone Note  I connected with Alice Smith on 08/11/18 at 10:30 AM EDT by telephone and verified that I am speaking with the correct person using two identifiers.   I discussed the limitations, risks, security and privacy concerns of performing an evaluation and management service by telephone and the availability of in person appointments. I also discussed with the patient that there may be a patient responsible charge related to this service. The patient expressed understanding and agreed to proceed.   History of Present Illness:  08/11/18 Alice Smith is a 73 y.o. female here today for follow up of dementia.  Alice Smith is accompanied by her husband, Alice Smith, who aids in obtaining history.  He reports that overall she has been fairly stable since her last visit in June 2019.  Unfortunately there seems to be some confusion on what medication she was taking. At her last visit, she was instructed to continue Namenda 10 mg twice daily.  Dr. Leta Baptist added Aricept 10 mg daily (titrating from 5mg  daily x 1 week).  They are unsure how long she continued Namenda.  It appears that it was discontinued from her medication list.  Alice Smith does not feel that she has taken this in some time.  She did tolerate the medication well.  She continues to perform all ADLs at home.  She is able to dress bathe and feed herself.  He reports that she can remember events from long ago but struggles with short-term information.  She does very rarely drive when accompanied by a family member.  Her husband reports that this is only short distances and he either rides with her or follows her.  He denies any falls or injuries.  No behavioral concerns at this time.   HISTORY (copied from Dr Gladstone Lighter note on 08/06/2017)   UPDATE (08/06/17, VRP): Since last visit, doing slightly worse. Tolerating  memantine. No alleviating or aggravating factors. Driving less now. Some diff with shopping. MMSE 19/30. Alice Smith 6/6. [place] IADL 5/8.   PRIOR HPI (02/04/17): 73 year old left-handed female here for evaluation of cognitive decline, confusion, lack of emotions, difficulty processing information.  Family has noted at least 2 years of gradual onset progressive confusion, fatigue, decreased processing speed, comprehension difficulty, decreased interest in day-to-day activity specifically household chores and cleanliness, which she used to be quite involved with.  She is also shown flat and emotional affect, less nurturing, less social.  She has had trouble with maintaining her checkbook, finances, shopping and ordering.  In June 2018 she got lost in [place] when trying to pick up their grandson.  Overall patient having decreased speech output and fluency.  Patient also underwent 7 or 8 surgeries in 2014, and family noted that many symptoms started at that time.    Observations/Objective:  Generalized: Well developed, in no acute distress  Mentation: Alert oriented to time, place, history taking. Follows all commands speech and language fluent   Assessment and Plan:  73 y.o. year old female  has a past medical history of Arthritis, Breast mass (04/4740), Complication of anesthesia, Concussion (2014, 05/2016), Dry cough, Hemorrhoids, Migraine, and Osteoporosis. here with    ICD-10-CM   1. Moderate dementia without behavioral disturbance (HCC)  F03.90 donepezil (ARICEPT) 10 MG tablet    DISCONTINUED: donepezil (ARICEPT) 10 MG tablet   Rae continues to do fairly well on Aricept 10 mg daily.  Unfortunately there was some confusion about continuing Namenda.  I have reeducated the family on the need to add Namenda to her medication list.  We will restart Namenda 5 mg twice daily.  If well tolerated we will increase dose to 10 mg twice daily.  I have also advised caution with driving.  We  have discussed in detail the option of formal testing with occupational therapy.  The family will consider this.  We have discussed a healthy lifestyle and regular exercise.  I advised follow-up in 6 months.  Appointment has been scheduled.  Patient and her family verbalize agreement and understanding with this plan.  No orders of the defined types were placed in this encounter.   Meds ordered this encounter  Medications   memantine (NAMENDA) 5 MG tablet    Sig: Take 1 tablet (5 mg total) by mouth 2 (two) times daily.    Dispense:  60 tablet    Refill:  2    Order Specific Question:   Supervising Provider    Answer:   Melvenia Beam [[id number]]   DISCONTD: donepezil (ARICEPT) 10 MG tablet    Sig: Take 1 tablet (10 mg total) by mouth at bedtime.    Dispense:       Order Specific Question:   Supervising Provider    Answer:   Melvenia Beam [[id number]]   donepezil (ARICEPT) 10 MG tablet    Sig: Take 1 tablet (10 mg total) by mouth at bedtime.    Dispense:  90 tablet    Refill:  3    Order Specific Question:   Supervising Provider    Answer:   Melvenia Beam [id number]     Follow Up Instructions:  I discussed the assessment and treatment plan with the patient. The patient was provided an opportunity to ask questions and all were answered. The patient agreed with the plan and demonstrated an understanding of the instructions.   The patient was advised to call back or seek an in-person evaluation if the symptoms worsen or if the condition fails to improve as anticipated.  I provided 60 minutes of non-face-to-face time during this encounter.  Patient and her husband are located at their place of residence during Administrator.  Provider is located at her place of residence.  Maryelizabeth Kaufmann, CMA helped to facilitate visit.   Debbora Presto, NP

## 2018-08-12 MED ORDER — FAMOTIDINE 20 MG PO TABS: 20.0000 mg | ORAL_TABLET | Freq: Two times a day (BID) | ORAL | 1 refills | Status: AC

## 2018-08-12 NOTE — Addendum Note (Signed)
Addended by: Josetta Huddle on: 08/12/2018 11:09 AM   Modules accepted: Orders

## 2018-08-18 NOTE — Progress Notes (Signed)
I reviewed note and agree with plan.   Penni Bombard, MD 1/97/5883, 2:54 PM Certified in Neurology, Neurophysiology and Neuroimaging  [facility] [address] [place], [place] [address] [phone]

## 2018-08-25 ENCOUNTER — Telehealth: Payer: Self-pay

## 2018-08-25 NOTE — Telephone Encounter (Signed)
Error

## 2018-08-28 ENCOUNTER — Other Ambulatory Visit: Payer: Self-pay | Admitting: *Deleted

## 2018-08-28 MED ORDER — MEMANTINE HCL 5 MG PO TABS: 5.0000 mg | ORAL_TABLET | Freq: Two times a day (BID) | ORAL | 1 refills | Status: DC

## 2018-09-26 ENCOUNTER — Other Ambulatory Visit: Payer: Self-pay | Admitting: Diagnostic Neuroimaging

## 2018-10-09 ENCOUNTER — Other Ambulatory Visit: Payer: Self-pay

## 2018-10-09 ENCOUNTER — Ambulatory Visit
Admission: RE | Admit: 2018-10-09 | Discharge: 2018-10-09 | Disposition: A | Discharge status: Home / Self Care | Payer: Medicare Other | Source: Ambulatory Visit | Attending: Family Medicine | Admitting: Family Medicine

## 2018-10-09 DIAGNOSIS — Z1231 Encounter for screening mammogram for malignant neoplasm of breast: Secondary | ICD-10-CM | POA: Diagnosis not present

## 2018-10-09 DIAGNOSIS — M85832 Other specified disorders of bone density and structure, left forearm: Secondary | ICD-10-CM | POA: Diagnosis not present

## 2018-10-09 DIAGNOSIS — Z780 Asymptomatic menopausal state: Secondary | ICD-10-CM | POA: Diagnosis not present

## 2018-10-09 DIAGNOSIS — M810 Age-related osteoporosis without current pathological fracture: Secondary | ICD-10-CM

## 2018-10-23 ENCOUNTER — Telehealth: Payer: Self-pay | Admitting: Diagnostic Neuroimaging

## 2018-10-23 NOTE — Telephone Encounter (Signed)
Pt is calling for a refill on her memantine (NAMENDA) 10 MG tablet  [facility] #[id number]

## 2018-10-23 NOTE — Telephone Encounter (Signed)
On 09/29/2018 Alice Smith prescribed 3 month supply of Namenda 10 mg BID w/ refills. Will call pt's pharmacy to confirm they have the prescription.

## 2018-10-23 NOTE — Telephone Encounter (Signed)
Spoke with Alice [facility] @ [facility]. She stated the have the Namenda prescription and refill isn't due. I spoke with pt's husband Alice Smith (on [place]). He wasn't aware pt had called but he said he will check on the Namenda when he gets home. He will call back only if needed and advised we "ignore the recent request". He verbalized appreciation for the call and his questions were answered during the call.

## 2019-02-23 ENCOUNTER — Ambulatory Visit: Payer: Medicare Other | Admitting: Family Medicine

## 2019-02-23 ENCOUNTER — Telehealth: Payer: Self-pay

## 2019-02-23 ENCOUNTER — Encounter: Payer: Self-pay | Admitting: Family Medicine

## 2019-02-23 ENCOUNTER — Other Ambulatory Visit: Payer: Self-pay

## 2019-02-23 VITALS — BP 129/79 | HR 72 | Temp 97.1°F | Ht 62.0 in | Wt 201.4 lb

## 2019-02-23 DIAGNOSIS — F0390 Unspecified dementia without behavioral disturbance: Secondary | ICD-10-CM

## 2019-02-23 DIAGNOSIS — F03B0 Unspecified dementia, moderate, without behavioral disturbance, psychotic disturbance, mood disturbance, and anxiety: Secondary | ICD-10-CM

## 2019-02-23 NOTE — Telephone Encounter (Signed)
Alice Smith (DPR signed) said that pt has not taken alendronate, metoprolol and famotidine in 40mths to 1 year due to pt cannot remember to take meds. Pts daughter and husband will be giving pt her med from now on. Pt has been taking OTC meds and memory meds but not the 3 med mentioned above. Pt has appt 04/06/19 for physical and Alice Smith wants to know if should restart the 3 meds above or just wait until pt comes in for appt.  No migraine for 2 years, no reflux problem other than pt clears her throat a lot. Pt saw neurologist appt earlier today and BP was 129/79 P 72 . Alice Smith ask Dr Damita Dunnings to review and call [facility] back.

## 2019-02-23 NOTE — Progress Notes (Signed)
PATIENT: Alice Smith DOB: Sep 05, 1945  REASON FOR VISIT: follow up HISTORY FROM: patient  Chief Complaint  Patient presents with  . Follow-up    RM1, with husband. States that her memory is worsened. Hasnt driven for a while, states that she has tried driving. Constant clearing of the throat constantly. Wants to wear the same clothes regulary. Wants to watch a particular station.      HISTORY OF PRESENT ILLNESS: Today 02/23/19 Alice Smith is a 74 y.o. female here today for follow up for dementia. She continues Aricept 10mg  daily and Namenda 10mg  BID. She is tolerating medications well. Her husband is with her today and aids in history. He reports that memory is fairly stable. They have noticed some worsening with concentration. She is wanting to sit still more than usual. She continues to walk about 1 mile several times a week. She has had more urine and bowel incontinence. She has had two accidents over the past 6 months. She is wearing pads. She is eating well. She does seem to enjoy simple sugars. She is able to perform ADL's. She does not cook. She does not drive. No falls.   HISTORY: (copied from my note on 08/11/2018)  Alice Smith is a 74 y.o. female here today for follow up of dementia.  Alice Smith is accompanied by her husband, Mr. Funkhouser, who aids in obtaining history.  He reports that overall she has been fairly stable since her last visit in June 2019.  Unfortunately there seems to be some confusion on what medication she was taking. At her last visit, she was instructed to continue Namenda 10 mg twice daily.  Dr. Leta Baptist added Aricept 10 mg daily (titrating from 5mg  daily x 1 week).  They are unsure how long she continued Namenda.  It appears that it was discontinued from her medication list.  Mr. Perry does not feel that she has taken this in some time.  She did tolerate the medication well.  She continues to perform all ADLs at home.  She is able to dress bathe and  feed herself.  He reports that she can remember events from long ago but struggles with short-term information.  She does very rarely drive when accompanied by a family member.  Her husband reports that this is only short distances and he either rides with her or follows her.  He denies any falls or injuries.  No behavioral concerns at this time.  HISTORY (copied from Dr Gladstone Lighter note on 08/06/2017)  UPDATE (08/06/17, VRP): Since last visit, doingslightly worse. Toleratingmemantine. No alleviating or aggravating factors.Driving less now. Some diff with shopping. MMSE 19/30. Ron Parker 6/6. [place] IADL 5/8.   PRIOR HPI (02/04/17):74 year old left-handed female here for evaluation of cognitive decline, confusion, lack of emotions, difficulty processing information.  Family has noted at least 2 years of gradual onset progressive confusion, fatigue, decreased processing speed, comprehension difficulty, decreased interest in day-to-day activity specifically household chores and cleanliness, which she used to be quite involved with. She is also shown flat and emotional affect, less nurturing, less social. She has had trouble with maintaining her checkbook, finances, shopping and ordering. In June 2018 she got lost in [place] when trying to pick up their grandson.  Overall patient having decreased speech output and fluency.  Patient also underwent 7 or 8 surgeries in 2014, and family noted that many symptoms started at that time.   REVIEW OF SYSTEMS: Out of a complete 14 system review of symptoms, the  patient complains only of the following symptoms, memory loss and all other reviewed systems are negative.  ALLERGIES: Allergies  Allergen Reactions  . Clarithromycin     REACTION: nausea and vomiting  . Erythromycin Ethylsuccinate     REACTION: GI upset  . Fluticasone-Salmeterol     REACTION: Sore tongue and rash  . Procaine Hcl     REACTION: swelling  . Sulfonamide Derivatives      REACTION: rash  . Topiramate     REACTION: confusion    HOME MEDICATIONS: Outpatient Medications Prior to Visit  Medication Sig Dispense Refill  . alendronate (FOSAMAX) 70 MG tablet TAKE 1 TABLET BY MOUTH EVERY 7 DAYS. TAKE WITH A FULL GLASS OF WATER ON AN EMPTY STOMACH 12 tablet 3  . aspirin 81 MG tablet Take 81 mg by mouth daily.    . calcium-vitamin D (OSCAL WITH D) 500-200 MG-UNIT tablet Take 1 tablet by mouth. 1000 units    . donepezil (ARICEPT) 10 MG tablet Take 1 tablet (10 mg total) by mouth at bedtime. 90 tablet 3  . famotidine (PEPCID) 20 MG tablet Take 1 tablet (20 mg total) by mouth 2 (two) times daily. 180 tablet 1  . fexofenadine (ALLEGRA) 60 MG tablet Take 60 mg by mouth 2 (two) times daily. OTC    . memantine (NAMENDA) 10 MG tablet TAKE 1 TABLET BY MOUTH TWICE A DAY 180 tablet 4  . metoprolol succinate (TOPROL-XL) 50 MG 24 hr tablet Take 1 tablet (50 mg total) by mouth daily. Take with or immediately following a meal. 90 tablet 3  . Multiple Vitamin (MULTIVITAMIN) capsule Take 1 capsule by mouth daily.    . SUPER B COMPLEX/C PO Take by mouth daily.     No facility-administered medications prior to visit.    PAST MEDICAL HISTORY: Past Medical History:  Diagnosis Date  . Arthritis   . Breast mass 05/2001   benign  . Complication of anesthesia    slow to wake up x 1  . Concussion 2014, 05/2016  . Dry cough    at night  . Hemorrhoids   . Migraine    evaluated with Dr. Domingo Cocking 11/2009  . Osteoporosis     PAST SURGICAL HISTORY: Past Surgical History:  Procedure Laterality Date  . ABDOMINAL EXPLORATION SURGERY     for twisted bowel  . ABDOMINAL HYSTERECTOMY     yrs ago  . BREAST SURGERY  1989   aspirated lump left breast  . CARPAL TUNNEL RELEASE     right 03/2001, left 07/2001  . ESOPHAGOGASTRODUODENOSCOPY    . KNEE ARTHROSCOPY     rt knee  . nissenfundiplication    . PARTIAL HYSTERECTOMY  1989   dyspasia, ovaries left in  . PARTIAL KNEE ARTHROPLASTY Right  06/23/2013   Procedure: RIGHT KNEE MEDIAL UNICOMPARTMENTAL ARTHROPLASTY;  Surgeon: Mauri Pole, MD;  Location: [facility];  Service: Orthopedics;  Laterality: Right;  . PARTIAL KNEE ARTHROPLASTY Left 08/31/2013   Procedure: LEFT UNICOMPARTMENTAL KNEE ATHROPLASTY   (MEDIAL) ;  Surgeon: Mauri Pole, MD;  Location: [facility];  Service: Orthopedics;  Laterality: Left;  . ROTATOR CUFF REPAIR     right 03/2001, left 07/2001    FAMILY HISTORY: Family History  Problem Relation Age of Onset  . Arthritis Mother   . Cancer Mother        lung cancer, dx'd in her 6s  . Cancer Father        throat  . COPD Brother   .  Cancer Sister        breast  . Breast cancer Sister   . Colon cancer Neg Hx     SOCIAL HISTORY: Social History   Socioeconomic History  . Marital status: Married    Spouse name: Not on file  . Number of children: 3  . Years of education: Not on file  . Highest education level: Not on file  Occupational History  . Occupation: home  Tobacco Use  . Smoking status: Former Smoker    Packs/day: 1.00    Years: 5.00    Pack years: 5.00    Types: Cigarettes    Quit date: 06/17/1978    Years since quitting: 40.7  . Smokeless tobacco: Never Used  . Tobacco comment: 5 pack year history  Substance and Sexual Activity  . Alcohol use: No  . Drug use: No  . Sexual activity: Not on file  Other Topics Concern  . Not on file  Social History Narrative   Second marriage, 30. Has 3 adult children, adopted 4, 6 grandkids.   From [place], [place]   Education- GED, some college   Social Determinants of Health   Financial Resource Strain:   . Difficulty of Paying Living Expenses: Not on file  Food Insecurity:   . Worried About Charity fundraiser in the Last Year: Not on file  . Ran Out of Food in the Last Year: Not on file  Transportation Needs:   . Lack of Transportation (Medical): Not on file  . Lack of Transportation (Non-Medical): Not on file  Physical Activity:   . Days of  Exercise per Week: Not on file  . Minutes of Exercise per Session: Not on file  Stress:   . Feeling of Stress : Not on file  Social Connections:   . Frequency of Communication with Friends and Family: Not on file  . Frequency of Social Gatherings with Friends and Family: Not on file  . Attends Religious Services: Not on file  . Active Member of Clubs or Organizations: Not on file  . Attends Archivist Meetings: Not on file  . Marital Status: Not on file  Intimate Partner Violence:   . Fear of Current or Ex-Partner: Not on file  . Emotionally Abused: Not on file  . Physically Abused: Not on file  . Sexually Abused: Not on file      PHYSICAL EXAM  Vitals:   02/23/19 1008  BP: 129/79  Pulse: 72  Temp: (!) 97.1 F (36.2 C)  Weight: 201 lb 6.4 oz (91.4 kg)  Height: 5\' 2"  (1.575 m)   Body mass index is 36.84 kg/m.  Generalized: Well developed, in no acute distress  Cardiology: normal rate and rhythm, no murmur noted Respiratory: clear to auscultation bilaterally  Neurological examination  Mentation: Alert oriented to time, place, history taking. Follows all commands speech and language fluent Cranial nerve II-XII: Pupils were equal round reactive to light. Extraocular movements were full, visual field were full on confrontational test. Facial sensation and strength were normal. Uvula tongue midline. Head turning and shoulder shrug  were normal and symmetric. Motor: The motor testing reveals 5 over 5 strength of all 4 extremities. Good symmetric motor tone is noted throughout.  Sensory: Sensory testing is intact to soft touch on all 4 extremities. No evidence of extinction is noted.  Coordination: Cerebellar testing reveals good finger-nose-finger and heel-to-shin bilaterally.  Gait and station: Gait is normal.   DIAGNOSTIC DATA (LABS, IMAGING, TESTING) - I  reviewed patient records, labs, notes, testing and imaging myself where available.  MMSE - Mini Mental State  Exam 02/23/2019 08/06/2017 02/04/2017  Orientation to time 4 4 5   Orientation to Place 3 3 5   Registration 3 3 3   Attention/ Calculation 0 0 0  Recall 3 1 2   Language- name 2 objects 2 2 2   Language- repeat 1 1 0  Language- follow 3 step command 3 3 3   Language- read & follow direction 0 1 1  Write a sentence 1 0 1  Write a sentence-comments - No subject -  Copy design 1 1 1   Copy design-comments named 3 animals - -  Total score 21 19 23      Lab Results  Component Value Date   WBC 7.2 01/22/2017   HGB 13.9 01/22/2017   HCT 41.2 01/22/2017   MCV 92.8 01/22/2017   PLT 280.0 01/22/2017      Component Value Date/Time   NA 144 07/25/2018 1527   K 4.4 07/25/2018 1527   CL 106 07/25/2018 1527   CO2 23 07/25/2018 1527   GLUCOSE 100 (H) 07/25/2018 1527   BUN 17 07/25/2018 1527   CREATININE 0.81 07/25/2018 1527   CALCIUM 9.6 07/25/2018 1527   PROT 6.9 07/25/2018 1527   ALBUMIN 4.0 01/18/2017 1020   AST 15 07/25/2018 1527   ALT 13 07/25/2018 1527   ALKPHOS 80 01/18/2017 1020   BILITOT 0.5 07/25/2018 1527   GFRNONAA 90 (L) 09/01/2013 0452   GFRAA >90 09/01/2013 0452   Lab Results  Component Value Date   CHOL 238 (H) 07/25/2018   HDL 60 07/25/2018   LDLCALC 148 (H) 07/25/2018   LDLDIRECT 129.8 10/01/2011   TRIG 160 (H) 07/25/2018   CHOLHDL 4.0 07/25/2018   No results found for: HGBA1C Lab Results  Component Value Date   VITAMINB12 428 01/22/2017   Lab Results  Component Value Date   TSH 5.27 (H) 07/25/2018     ASSESSMENT AND PLAN 74 y.o. year old female  has a past medical history of Arthritis, Breast mass ([id number]), Complication of anesthesia, Concussion (2014, 05/2016), Dry cough, Hemorrhoids, Migraine, and Osteoporosis. here with     ICD-10-CM   1. Moderate dementia without behavioral disturbance ([place])  F03.90     Cetera continues to do well physically.  Memory loss is stable on Aricept 10 mg daily and Namenda 10 mg twice daily.  We will continue current  medication regimen.  We have had a long discussion today regarding safety. [name], her daughter, joins via telephone and aware of plan.  I would recommend that Mr. Edmister assist in medication administration.  I recommend that Ivry not drive or operate appliances while alone.  We have discussed causes of incontinence.  I have advised that Mr. Hahm monitor her closely for any acute changes in cognition.  He will follow-up closely with primary care for any concerns of infection.  She was encouraged to continue regular physical and mental activity.  Adequate hydration and well-balanced diet advised.  She will follow-up in 6 months, sooner if needed.  She and her husband both verbalized understanding and agreement with this plan.   No orders of the defined types were placed in this encounter.    No orders of the defined types were placed in this encounter.     I spent 25 minutes with the patient. 50% of this time was spent counseling and educating patient on plan of care and medications.    Francine Hannan, FNP-C  02/23/2019, 11:03 AM [facility] [address], [place], [place] [address] [phone]

## 2019-02-23 NOTE — Patient Instructions (Signed)
Continue Aricept 10mg  daily and Namenda 10mg  twice daily  Continue safety recommendations (no driving, no cooking, assist with medications, etc.)  Well balanced diet and regular exercise.   Follow up in 6 months, sooner if needed   Dementia Caregiver Guide Dementia is a term used to describe a number of symptoms that affect memory and thinking. The most common symptoms include:  Memory loss.  Trouble with language and communication.  Trouble concentrating.  Poor judgment.  Problems with reasoning.  Child-like behavior and language.  Extreme anxiety.  Angry outbursts.  Wandering from home or public places. Dementia usually gets worse slowly over time. In the early stages, people with dementia can stay independent and safe with some help. In later stages, they need help with daily tasks such as dressing, grooming, and using the bathroom. How to help the person with dementia cope Dementia can be frightening and confusing. Here are some tips to help the person with dementia cope with changes caused by the disease. General tips  Keep the person on track with his or her routine.  Try to identify areas where the person may need help.  Be supportive, patient, calm, and encouraging.  Gently remind the person that adjusting to changes takes time.  Help with the tasks that the person has asked for help with.  Keep the person involved in daily tasks and decisions as much as possible.  Encourage conversation, but try not to get frustrated or harried if the person struggles to find words or does not seem to appreciate your help. Communication tips  When the person is talking or seems frustrated, make eye contact and hold the person's hand.  Ask specific questions that need yes or no answers.  Use simple words, short sentences, and a calm voice. Only give one direction at a time.  When offering choices, limit them to just 1 or 2.  Avoid correcting the person in a negative  way.  If the person is struggling to find the right words, gently try to help him or her. How to recognize symptoms of stress Symptoms of stress in caregivers include:  Feeling frustrated or angry with the person with dementia.  Denying that the person has dementia or that his or her symptoms will not improve.  Feeling hopeless and unappreciated.  Difficulty sleeping.  Difficulty concentrating.  Feeling anxious, irritable, or depressed.  Developing stress-related health problems.  Feeling like you have too little time for your own life. Follow these instructions at home:   Make sure that you and the person you are caring for: ? Get regular sleep. ? Exercise regularly. ? Eat regular, nutritious meals. ? Drink enough fluid to keep your urine clear or pale yellow. ? Take over-the-counter and prescription medicines only as told by your health care providers. ? Attend all scheduled health care appointments.  Join a support group with others who are caregivers.  Ask about respite care resources so that you can have a regular break from the stress of caregiving.  Look for signs of stress in yourself and in the person you are caring for. If you notice signs of stress, take steps to manage it.  Consider any safety risks and take steps to avoid them.  Organize medications in a pill box for each day of the week.  Create a plan to handle any legal or financial matters. Get legal or financial advice if needed.  Keep a calendar in a central location to remind the person of appointments or other  activities. Tips for reducing the risk of injury  Keep floors clear of clutter. Remove rugs, magazine racks, and floor lamps.  Keep hallways well lit, especially at night.  Put a handrail and nonslip mat in the bathtub or shower.  Put childproof locks on cabinets that contain dangerous items, such as medicines, alcohol, guns, toxic cleaning items, sharp tools or utensils, matches, and  lighters.  Put the locks in places where the person cannot see or reach them easily. This will help ensure that the person does not wander out of the house and get lost.  Be prepared for emergencies. Keep a list of emergency phone numbers and addresses in a convenient area.  Remove car keys and lock garage doors so that the person does not try to get in the car and drive.  Have the person wear a bracelet that tracks locations and identifies the person as having memory problems. This should be worn at all times for safety. Where to find support: Many individuals and organizations offer support. These include:  Support groups for people with dementia and for caregivers.  Counselors or therapists.  Home health care services.  Adult day care centers. Where to find more information Alzheimer's Association: [employer] Contact a health care provider if:  The person's health is rapidly getting worse.  You are no longer able to care for the person.  Caring for the person is affecting your physical and emotional health.  The person threatens himself or herself, you, or anyone else. Summary  Dementia is a term used to describe a number of symptoms that affect memory and thinking.  Dementia usually gets worse slowly over time.  Take steps to reduce the person's risk of injury, and to plan for future care.  Caregivers need support, relief from caregiving, and time for their own lives. This information is not intended to replace advice given to you by your health care provider. Make sure you discuss any questions you have with your health care provider. Document Revised: 01/18/2017 Document Reviewed: 01/10/2016 Elsevier Patient Education  2020 [employer].

## 2019-02-24 NOTE — Telephone Encounter (Signed)
I would hold off taking those meds in the meantime.  If she has a change in symptoms between now and the office visit then let us know.  I would continue as is otherwise.  I did not change the med list at this point.  We can talk about it when she comes in.  Thanks.

## 2019-02-24 NOTE — Telephone Encounter (Signed)
[name] advised.

## 2019-02-26 ENCOUNTER — Telehealth: Payer: Self-pay | Admitting: Family Medicine

## 2019-02-26 NOTE — Telephone Encounter (Signed)
Patients daughter [name] ( on [place]) called in and stated that she was wondering if there is a way to get a referral for a Physical Therapist to come to the home. She also is requesting a call back for more information on the order.

## 2019-02-26 NOTE — Telephone Encounter (Signed)
Please let Alice Smith know that I am unsure if I can get this covered. When she was here, I asked about walking and if there were any concerns of falls. Patient and her husband reported that she is walking regularly for exercise and no concerns of gait changes or falls. She was able to do all ADL's independently. I would not mind referring her but do not have any diagnoses that I could use. Does she have specific concerns? We may be able to work with her appt at PCP next month. Maybe PCP could refer if they see a need? I am happy to bring her back to reassess but have no indication for referral at this time.

## 2019-02-26 NOTE — Telephone Encounter (Signed)
I called the daughter of pt and relayed the message per AL/NP and she verbalized understanding.  I relayed that AL/NP would be glad to see and assess again, but since has appt in mid 03/2019 with pcp they may be able to assist in this for them.  If not will call us back.

## 2019-02-26 NOTE — Telephone Encounter (Signed)
I called daughter back. They forgot to ask when in for appt about the possibility of getting [facility] PT/OT for her )relating to gait/balance, strenghtening.  Has had recent weight gain.  03-2019 has pcp annual appt. Would like to use [facility].

## 2019-03-04 ENCOUNTER — Telehealth: Payer: Self-pay | Admitting: *Deleted

## 2019-03-04 MED ORDER — MEMANTINE HCL 10 MG PO TABS: 10.0000 mg | ORAL_TABLET | Freq: Two times a day (BID) | ORAL | 1 refills | Status: DC

## 2019-03-04 NOTE — Telephone Encounter (Signed)
Received call back from husband who stated they are transferring all Rx to [facility]. I advised him we will send memantine to [facility]. He  verbalized understanding, appreciation.

## 2019-03-04 NOTE — Addendum Note (Signed)
Addended by: Minna Antis on: 03/04/2019 05:06 PM   Modules accepted: Orders

## 2019-03-04 NOTE — Telephone Encounter (Signed)
Received fax from [facility] for memantine Rx. Last refilled to [facility] [place] Aug 2020 x 1 year. Called husband, on DPR and LVM advising him of fax. Requested he call back to confirm change of pharmacy.

## 2019-03-05 ENCOUNTER — Telehealth: Payer: Self-pay

## 2019-03-05 NOTE — Telephone Encounter (Signed)
[name] (DPR signed) left v/m; to prepare for appt 04/06/19 at 2:15 in office CPX. Pt has dementia and [name] feels like pt is declining. Pt cannot remember answers to these questions and [name] wants to know if Dr Damita Dunnings has answers to these questions .  Question 1)when was pts last bone density test and will Dr Damita Dunnings order bone density at well ck visit in Feb. Question 2) Who is pts GYN and not sure of last mammogram and pelvic exam.Quincey wants pt to have these test as routine maintenance. Question 3) How many people(1 or 2) can come back with pt at her CPX. Quincey request cb.

## 2019-03-06 NOTE — Telephone Encounter (Signed)
1.  Last DXA done 09/2018.  We'll talk about it when she comes in.  Doesn't need repeat now.   2.  Mammogram 09/2018 and shouldn't need pap/pelvic at this point given h/o hysterectomy and age >6.  I didn't think she had seen gyn recently but may not need gyn f/u at this point given the above.   We can talk about it when she comes in.  3.  Likely reasonable for 1 person to come back with patient for OV.   That is usually what we do when patients have memory loss.  If that isn't going to work, then let me know.    Thanks.

## 2019-03-06 NOTE — Telephone Encounter (Signed)
Spoke with [name], aware of all info from Dr. Damita Dunnings

## 2019-03-09 DIAGNOSIS — L578 Other skin changes due to chronic exposure to nonionizing radiation: Secondary | ICD-10-CM | POA: Diagnosis not present

## 2019-03-09 DIAGNOSIS — L821 Other seborrheic keratosis: Secondary | ICD-10-CM | POA: Diagnosis not present

## 2019-03-09 DIAGNOSIS — D1801 Hemangioma of skin and subcutaneous tissue: Secondary | ICD-10-CM | POA: Diagnosis not present

## 2019-03-09 DIAGNOSIS — Z1283 Encounter for screening for malignant neoplasm of skin: Secondary | ICD-10-CM | POA: Diagnosis not present

## 2019-03-09 DIAGNOSIS — D225 Melanocytic nevi of trunk: Secondary | ICD-10-CM | POA: Diagnosis not present

## 2019-03-10 ENCOUNTER — Ambulatory Visit: Payer: Medicare Other | Attending: Internal Medicine

## 2019-03-10 DIAGNOSIS — Z23 Encounter for immunization: Secondary | ICD-10-CM | POA: Insufficient documentation

## 2019-03-10 NOTE — Progress Notes (Signed)
   Covid-19 Vaccination Clinic  Name:  Alice Smith    MRN: [id number] DOB: [birth year]  03/10/2019  [name] was observed post Covid-19 immunization for 15 minutes without incidence. She was provided with Vaccine Information Sheet and instruction to access the V-Safe system.   [name] was instructed to call 911 with any severe reactions post vaccine: Marland Kitchen Difficulty breathing  . Swelling of your face and throat  . A fast heartbeat  . A bad rash all over your body  . Dizziness and weakness    Immunizations Administered    Name Date Dose VIS Date Route   Pfizer COVID-19 Vaccine 03/10/2019  3:15 PM 0.3 mL 01/30/2019 Intramuscular   Manufacturer: [employer]   Lot: [id number]   [place]: [id number]

## 2019-03-29 ENCOUNTER — Other Ambulatory Visit: Payer: Self-pay | Admitting: Family Medicine

## 2019-03-29 DIAGNOSIS — M810 Age-related osteoporosis without current pathological fracture: Secondary | ICD-10-CM

## 2019-03-29 DIAGNOSIS — E785 Hyperlipidemia, unspecified: Secondary | ICD-10-CM

## 2019-03-29 DIAGNOSIS — R7989 Other specified abnormal findings of blood chemistry: Secondary | ICD-10-CM

## 2019-03-30 ENCOUNTER — Ambulatory Visit: Payer: Medicare Other

## 2019-03-30 ENCOUNTER — Other Ambulatory Visit (INDEPENDENT_AMBULATORY_CARE_PROVIDER_SITE_OTHER): Payer: Medicare Other

## 2019-03-30 ENCOUNTER — Telehealth: Payer: Self-pay

## 2019-03-30 ENCOUNTER — Other Ambulatory Visit: Payer: Medicare Other

## 2019-03-30 ENCOUNTER — Other Ambulatory Visit: Payer: Self-pay

## 2019-03-30 DIAGNOSIS — R7989 Other specified abnormal findings of blood chemistry: Secondary | ICD-10-CM

## 2019-03-30 DIAGNOSIS — M810 Age-related osteoporosis without current pathological fracture: Secondary | ICD-10-CM | POA: Diagnosis not present

## 2019-03-30 DIAGNOSIS — E785 Hyperlipidemia, unspecified: Secondary | ICD-10-CM

## 2019-03-30 LAB — CBC WITH DIFFERENTIAL/PLATELET
Basophils Absolute: 0.1 10*3/uL (ref 0.0–0.1)
Basophils Relative: 0.8 % (ref 0.0–3.0)
Eosinophils Absolute: 0.2 10*3/uL (ref 0.0–0.7)
Eosinophils Relative: 3.6 % (ref 0.0–5.0)
HCT: 41.8 % (ref 36.0–46.0)
Hemoglobin: 14 g/dL (ref 12.0–15.0)
Lymphocytes Relative: 34.5 % (ref 12.0–46.0)
Lymphs Abs: 2.2 10*3/uL (ref 0.7–4.0)
MCHC: 33.5 g/dL (ref 30.0–36.0)
MCV: 91 fl (ref 78.0–100.0)
Monocytes Absolute: 0.4 10*3/uL (ref 0.1–1.0)
Monocytes Relative: 6.8 % (ref 3.0–12.0)
Neutro Abs: 3.5 10*3/uL (ref 1.4–7.7)
Neutrophils Relative %: 54.3 % (ref 43.0–77.0)
Platelets: 285 10*3/uL (ref 150.0–400.0)
RBC: 4.6 Mil/uL (ref 3.87–5.11)
RDW: 13.4 % (ref 11.5–15.5)
WBC: 6.4 10*3/uL (ref 4.0–10.5)

## 2019-03-30 LAB — COMPREHENSIVE METABOLIC PANEL
ALT: 19 U/L (ref 0–35)
AST: 19 U/L (ref 0–37)
Albumin: 3.9 g/dL (ref 3.5–5.2)
Alkaline Phosphatase: 99 U/L (ref 39–117)
BUN: 18 mg/dL (ref 6–23)
CO2: 27 mEq/L (ref 19–32)
Calcium: 9.5 mg/dL (ref 8.4–10.5)
Chloride: 107 mEq/L (ref 96–112)
Creatinine, Ser: 0.78 mg/dL (ref 0.40–1.20)
GFR: 72.22 mL/min (ref >60.00)
Glucose, Bld: 114 mg/dL — ABNORMAL HIGH (ref 70–99)
Potassium: 4.1 mEq/L (ref 3.5–5.1)
Sodium: 143 mEq/L (ref 135–145)
Total Bilirubin: 0.7 mg/dL (ref 0.2–1.2)
Total Protein: 7 g/dL (ref 6.0–8.3)

## 2019-03-30 LAB — LIPID PANEL
Cholesterol: 220 mg/dL — ABNORMAL HIGH (ref 0–200)
HDL: 50.5 mg/dL (ref >39.00)
LDL Cholesterol: 143 mg/dL — ABNORMAL HIGH (ref 0–99)
NonHDL: 169.74
Total CHOL/HDL Ratio: 4
Triglycerides: 133 mg/dL (ref 0.0–149.0)
VLDL: 26.6 mg/dL (ref 0.0–40.0)

## 2019-03-30 LAB — VITAMIN D 25 HYDROXY (VIT D DEFICIENCY, FRACTURES): VITD: 25.42 ng/mL — ABNORMAL LOW (ref 30.00–100.00)

## 2019-03-30 LAB — TSH: TSH: 3.53 u[IU]/mL (ref 0.35–4.50)

## 2019-03-30 NOTE — Telephone Encounter (Signed)
Noted. Thanks. Is she still coming to see me as scheduled?  Please let me know.

## 2019-03-30 NOTE — Telephone Encounter (Signed)
Yes she is

## 2019-03-30 NOTE — Telephone Encounter (Signed)
Thanks

## 2019-03-30 NOTE — Telephone Encounter (Signed)
Medicare Wellness visit was cancelled for today because it has not been 1 year since her last one. Last AWV was 07/25/2018. I will call and notify patient.

## 2019-03-31 ENCOUNTER — Ambulatory Visit: Payer: Medicare Other

## 2019-03-31 ENCOUNTER — Ambulatory Visit: Payer: Medicare Other | Attending: Internal Medicine

## 2019-03-31 DIAGNOSIS — Z23 Encounter for immunization: Secondary | ICD-10-CM | POA: Insufficient documentation

## 2019-03-31 NOTE — Progress Notes (Signed)
   Covid-19 Vaccination Clinic  Name:  STATIA ROUTLEDGE    MRN: [id number] DOB: 09-25-1945  03/31/2019  Ms. Hoon was observed post Covid-19 immunization for 15 minutes without incidence. She was provided with Vaccine Information Sheet and instruction to access the V-Safe system.   Ms. Turnquist was instructed to call 911 with any severe reactions post vaccine: Marland Kitchen Difficulty breathing  . Swelling of your face and throat  . A fast heartbeat  . A bad rash all over your body  . Dizziness and weakness    Immunizations Administered    Name Date Dose VIS Date Route   Pfizer COVID-19 Vaccine 03/31/2019 10:27 AM 0.3 mL 01/30/2019 Intramuscular   Manufacturer: [place]   Lot: [id number]   [place]: [id number]

## 2019-04-06 ENCOUNTER — Ambulatory Visit (INDEPENDENT_AMBULATORY_CARE_PROVIDER_SITE_OTHER): Payer: Medicare Other | Admitting: Family Medicine

## 2019-04-06 ENCOUNTER — Other Ambulatory Visit: Payer: Self-pay

## 2019-04-06 ENCOUNTER — Encounter: Payer: Self-pay | Admitting: Family Medicine

## 2019-04-06 VITALS — BP 122/76 | HR 83 | Temp 95.7°F | Ht 62.0 in | Wt 201.1 lb

## 2019-04-06 DIAGNOSIS — F0390 Unspecified dementia without behavioral disturbance: Secondary | ICD-10-CM | POA: Diagnosis not present

## 2019-04-06 DIAGNOSIS — F03B0 Unspecified dementia, moderate, without behavioral disturbance, psychotic disturbance, mood disturbance, and anxiety: Secondary | ICD-10-CM

## 2019-04-06 DIAGNOSIS — R059 Cough, unspecified: Secondary | ICD-10-CM

## 2019-04-06 DIAGNOSIS — M810 Age-related osteoporosis without current pathological fracture: Secondary | ICD-10-CM

## 2019-04-06 DIAGNOSIS — G43009 Migraine without aura, not intractable, without status migrainosus: Secondary | ICD-10-CM | POA: Diagnosis not present

## 2019-04-06 DIAGNOSIS — R05 Cough: Secondary | ICD-10-CM | POA: Diagnosis not present

## 2019-04-06 DIAGNOSIS — Z0000 Encounter for general adult medical examination without abnormal findings: Secondary | ICD-10-CM

## 2019-04-06 DIAGNOSIS — E559 Vitamin D deficiency, unspecified: Secondary | ICD-10-CM

## 2019-04-06 DIAGNOSIS — Z7189 Other specified counseling: Secondary | ICD-10-CM

## 2019-04-06 MED ORDER — ALENDRONATE SODIUM 70 MG PO TABS: ORAL_TABLET | ORAL | Status: DC

## 2019-04-06 MED ORDER — DIALYVITE VITAMIN D 5000 125 MCG (5000 UT) PO CAPS: 5000.0000 [IU] | ORAL_CAPSULE | Freq: Every day | ORAL | Status: AC

## 2019-04-06 NOTE — Patient Instructions (Addendum)
Restart pepcid and see if the cough gets better.    Take a higher dose of vitamin D.  Recheck vit D in about 6 months at a visit with Damita Dunnings.   Hold alendronate for now.   Update me as needed.   Take care.  Glad to see you.

## 2019-04-06 NOTE — Progress Notes (Signed)
This visit occurred during the SARS-CoV-2 public health emergency.  Safety protocols were in place, including screening questions prior to the visit, additional usage of staff PPE, and extensive cleaning of exam room while observing appropriate contact time as indicated for disinfecting solutions.  She is having a lot of throat clearing.  Ongoing, frequent, daily.  Some of it may be habitual but she also has a longstanding h/o cough.  She uses sips of water, cough drops.  She is off famotidine.  She had been off fosamax for about 6 months, so that shouldn't be contributing to the cough.    She is off metoprolol.  She isn't having migraines.  D/w pt.  Reasonable to stay off for now.    She has been on namenda and donepezil. Family has noted changes.  Doesn't cook or drive.  She had missed some of her meds, see above.  Family is supportive, here with daughter today.  Prev neuro note d/w pt and daughter.  Family is monitoring her medicine and helping with bills/laundry.  She is dressing independently.  Safety d/w pt.  She is still caring for her bird (Max, a parrot) at baseline.  Family has noted that her conversations are shorter and she doesn't initiate conversations.   covid cautions d/w pt.    Last DXA done 09/2018.  she had taken fosamax for about 4 years previously, stopped 6 months ago.  Vit D low.  D/w pt.    Mammogram 09/2018 and shouldn't need pap/pelvic at this point given h/o hysterectomy and age >21.  tdap 2016 PNA up to date.  Flu 2020 zostavax 2008 covid vaccine 2021 Colon cancer screening deferred for now.  defer given her history of memory loss and the potential for sedation related to the procedure.  Patient and daughter agree.   Advance directive- Husband or daughter [name] designated if patient were incapacitated.   PMH and SH reviewed  ROS: Per HPI unless specifically indicated in ROS section   Meds, vitals, and allergies reviewed.   GEN: nad, alert, give short answers in  response to questions. HEENT: mucous membranes moist NECK: supple w/o LA CV: rrr.  no murmur PULM: ctab, no inc wob ABD: soft, +bs EXT: no edema SKIN: no acute rash  At least 30 minutes were devoted to patient care in this encounter (this can potentially include time spent reviewing the patient's file/history, interviewing and examining the patient, counseling/reviewing plan with patient, ordering referrals, ordering tests, reviewing relevant laboratory or x-ray data, and documenting the encounter).

## 2019-04-07 ENCOUNTER — Telehealth: Payer: Self-pay

## 2019-04-07 NOTE — Telephone Encounter (Signed)
[name] (DPR signed) said pt had annual exam on 04/06/19 and [name] wants to clarify instructions for Calcium- Vit D supplement 500-200 mg tab with instructions take 1 tab by mouth. 1000 units. Quincey request cb after Dr Damita Dunnings reviews. Pt is also taking Vit D 5000 UT cap daily.

## 2019-04-08 DIAGNOSIS — Z0000 Encounter for general adult medical examination without abnormal findings: Secondary | ICD-10-CM | POA: Insufficient documentation

## 2019-04-08 MED ORDER — CALCIUM CARBONATE-VITAMIN D 500-200 MG-UNIT PO TABS: 1.0000 | ORAL_TABLET | Freq: Every day | ORAL | Status: AC

## 2019-04-08 NOTE — Assessment & Plan Note (Signed)
Last DXA done 09/2018.  she had taken fosamax for about 4 years previously, stopped 6 months ago.  Vit D low.  D/w pt. would increase vitamin D supplementation and recheck vitamin D later on.  If/when her vitamin D is normal, consider restarting Fosamax for 1 more year, depending on her clinical situation.  Discussed.  Agreed.

## 2019-04-08 NOTE — Assessment & Plan Note (Signed)
She is off metoprolol.  She isn't having migraines.  D/w pt.  Reasonable to stay off for now.

## 2019-04-08 NOTE — Telephone Encounter (Signed)
I apologize for the confusion.  I thought this was corrected on her after visit summary before she left the clinic.  I corrected the med list in the meantime. I would take 1 tablet of the Os-Cal 500/200 daily. I would also take 1 tablet of vitamin D 5000 units daily. That way she will be taking some calcium with extra vitamin D.  Let me know if this does not make sense.  Thanks.

## 2019-04-08 NOTE — Assessment & Plan Note (Signed)
Mammogram 09/2018 and shouldn't need pap/pelvic at this point given h/o hysterectomy and age >4.  tdap 2016 PNA up to date.  Flu 2020 zostavax 2008 covid vaccine 2021 Colon cancer screening deferred for now.  defer given her history of memory loss and the potential for sedation related to the procedure.  Patient and daughter agree.   Advance directive- Husband or daughter [name] designated if patient were incapacitated.

## 2019-04-08 NOTE — Assessment & Plan Note (Signed)
Advance directive- Husband or daughter [place] designated if patient were incapacitated.  

## 2019-04-08 NOTE — Assessment & Plan Note (Signed)
No change in medications at this point.  Family is supportive.  She will follow-up with neurology.  Safety discussed.

## 2019-04-08 NOTE — Assessment & Plan Note (Signed)
She is having a lot of throat clearing.  Ongoing, frequent, daily.  Some of it may be habitual but she also has a longstanding h/o cough.  She uses sips of water, cough drops.  She is off famotidine.  She had been off fosamax for about 6 months, so that shouldn't be contributing to the cough.   Reasonable to restart famotidine and then update me as needed.

## 2019-04-10 NOTE — Telephone Encounter (Signed)
LVM for [employer]

## 2019-04-13 NOTE — Telephone Encounter (Signed)
Left detailed message on voicemail of daughter.  DPR

## 2019-04-22 ENCOUNTER — Other Ambulatory Visit: Payer: Self-pay | Admitting: Family Medicine

## 2019-04-22 NOTE — Telephone Encounter (Signed)
Electronic refill request alendronate See office note 04/06/19 regarding holding medication for now.

## 2019-04-22 NOTE — Telephone Encounter (Signed)
Agreed, would hold off at least until her vitamin D has normalized. We discussed this at the previous office visit. Thanks.

## 2019-04-26 ENCOUNTER — Other Ambulatory Visit: Payer: Self-pay | Admitting: Family Medicine

## 2019-07-01 ENCOUNTER — Other Ambulatory Visit: Payer: Self-pay | Admitting: Diagnostic Neuroimaging

## 2019-07-01 DIAGNOSIS — F03B0 Unspecified dementia, moderate, without behavioral disturbance, psychotic disturbance, mood disturbance, and anxiety: Secondary | ICD-10-CM

## 2019-07-06 ENCOUNTER — Ambulatory Visit (INDEPENDENT_AMBULATORY_CARE_PROVIDER_SITE_OTHER): Payer: Medicare Other

## 2019-07-06 DIAGNOSIS — Z0000 Encounter for general adult medical examination without abnormal findings: Secondary | ICD-10-CM | POA: Diagnosis not present

## 2019-07-06 NOTE — Patient Instructions (Signed)
Alice Smith , Thank you for taking time to come for your Medicare Wellness Visit. I appreciate your ongoing commitment to your health goals. Please review the following plan we discussed and let me know if I can assist you in the future.   Screening recommendations/referrals: Colonoscopy: declined Mammogram: Up to date, completed 10/09/2018 Bone Density: Up to date, completed 10/09/2018 Recommended yearly ophthalmology/optometry visit for glaucoma screening and checkup Recommended yearly dental visit for hygiene and checkup  Vaccinations: Influenza vaccine: Up to date, completed 12/01/2018 Pneumococcal vaccine: Completed series Tdap vaccine: Up to date, completed 09/09/2014 Shingles vaccine: discussed    Advanced directives: Please bring a copy of your POA (Power of Shields) and/or Living Will to your next appointment.   Conditions/risks identified: hyperlipidemia  Next appointment: 10/05/2019 @ 2 pm    Preventive Care 65 Years and Older, Female Preventive care refers to lifestyle choices and visits with your health care provider that can promote health and wellness. What does preventive care include?  A yearly physical exam. This is also called an annual well check.  Dental exams once or twice a year.  Routine eye exams. Ask your health care provider how often you should have your eyes checked.  Personal lifestyle choices, including:  Daily care of your teeth and gums.  Regular physical activity.  Eating a healthy diet.  Avoiding tobacco and drug use.  Limiting alcohol use.  Practicing safe sex.  Taking low-dose aspirin every day.  Taking vitamin and mineral supplements as recommended by your health care provider. What happens during an annual well check? The services and screenings done by your health care provider during your annual well check will depend on your age, overall health, lifestyle risk factors, and family history of disease. Counseling  Your health  care provider may ask you questions about your:  Alcohol use.  Tobacco use.  Drug use.  Emotional well-being.  Home and relationship well-being.  Sexual activity.  Eating habits.  History of falls.  Memory and ability to understand (cognition).  Work and work Statistician.  Reproductive health. Screening  You may have the following tests or measurements:  Height, weight, and BMI.  Blood pressure.  Lipid and cholesterol levels. These may be checked every 5 years, or more frequently if you are over 79 years old.  Skin check.  Lung cancer screening. You may have this screening every year starting at age 59 if you have a 30-pack-year history of smoking and currently smoke or have quit within the past 15 years.  Fecal occult blood test (FOBT) of the stool. You may have this test every year starting at age 54.  Flexible sigmoidoscopy or colonoscopy. You may have a sigmoidoscopy every 5 years or a colonoscopy every 10 years starting at age 64.  Hepatitis C blood test.  Hepatitis B blood test.  Sexually transmitted disease (STD) testing.  Diabetes screening. This is done by checking your blood sugar (glucose) after you have not eaten for a while (fasting). You may have this done every 1-3 years.  Bone density scan. This is done to screen for osteoporosis. You may have this done starting at age 9.  Mammogram. This may be done every 1-2 years. Talk to your health care provider about how often you should have regular mammograms. Talk with your health care provider about your test results, treatment options, and if necessary, the need for more tests. Vaccines  Your health care provider may recommend certain vaccines, such as:  Influenza vaccine. This is  recommended every year.  Tetanus, diphtheria, and acellular pertussis (Tdap, Td) vaccine. You may need a Td booster every 10 years.  Zoster vaccine. You may need this after age 68.  Pneumococcal 13-valent conjugate  (PCV13) vaccine. One dose is recommended after age 63.  Pneumococcal polysaccharide (PPSV23) vaccine. One dose is recommended after age 9. Talk to your health care provider about which screenings and vaccines you need and how often you need them. This information is not intended to replace advice given to you by your health care provider. Make sure you discuss any questions you have with your health care provider. Document Released: 03/04/2015 Document Revised: 10/26/2015 Document Reviewed: 12/07/2014 Elsevier Interactive Patient Education  2017 [place] Prevention in the Home Falls can cause injuries. They can happen to people of all ages. There are many things you can do to make your home safe and to help prevent falls. What can I do on the outside of my home?  Regularly fix the edges of walkways and driveways and fix any cracks.  Remove anything that might make you trip as you walk through a door, such as a raised step or threshold.  Trim any bushes or trees on the path to your home.  Use bright outdoor lighting.  Clear any walking paths of anything that might make someone trip, such as rocks or tools.  Regularly check to see if handrails are loose or broken. Make sure that both sides of any steps have handrails.  Any raised decks and porches should have guardrails on the edges.  Have any leaves, snow, or ice cleared regularly.  Use sand or salt on walking paths during winter.  Clean up any spills in your garage right away. This includes oil or grease spills. What can I do in the bathroom?  Use night lights.  Install grab bars by the toilet and in the tub and shower. Do not use towel bars as grab bars.  Use non-skid mats or decals in the tub or shower.  If you need to sit down in the shower, use a plastic, non-slip stool.  Keep the floor dry. Clean up any water that spills on the floor as soon as it happens.  Remove soap buildup in the tub or shower  regularly.  Attach bath mats securely with double-sided non-slip rug tape.  Do not have throw rugs and other things on the floor that can make you trip. What can I do in the bedroom?  Use night lights.  Make sure that you have a light by your bed that is easy to reach.  Do not use any sheets or blankets that are too big for your bed. They should not hang down onto the floor.  Have a firm chair that has side arms. You can use this for support while you get dressed.  Do not have throw rugs and other things on the floor that can make you trip. What can I do in the kitchen?  Clean up any spills right away.  Avoid walking on wet floors.  Keep items that you use a lot in easy-to-reach places.  If you need to reach something above you, use a strong step stool that has a grab bar.  Keep electrical cords out of the way.  Do not use floor polish or wax that makes floors slippery. If you must use wax, use non-skid floor wax.  Do not have throw rugs and other things on the floor that can make you trip.  What can I do with my stairs?  Do not leave any items on the stairs.  Make sure that there are handrails on both sides of the stairs and use them. Fix handrails that are broken or loose. Make sure that handrails are as long as the stairways.  Check any carpeting to make sure that it is firmly attached to the stairs. Fix any carpet that is loose or worn.  Avoid having throw rugs at the top or bottom of the stairs. If you do have throw rugs, attach them to the floor with carpet tape.  Make sure that you have a light switch at the top of the stairs and the bottom of the stairs. If you do not have them, ask someone to add them for you. What else can I do to help prevent falls?  Wear shoes that:  Do not have high heels.  Have rubber bottoms.  Are comfortable and fit you well.  Are closed at the toe. Do not wear sandals.  If you use a stepladder:  Make sure that it is fully  opened. Do not climb a closed stepladder.  Make sure that both sides of the stepladder are locked into place.  Ask someone to hold it for you, if possible.  Clearly mark and make sure that you can see:  Any grab bars or handrails.  First and last steps.  Where the edge of each step is.  Use tools that help you move around (mobility aids) if they are needed. These include:  Canes.  Walkers.  Scooters.  Crutches.  Turn on the lights when you go into a dark area. Replace any light bulbs as soon as they burn out.  Set up your furniture so you have a clear path. Avoid moving your furniture around.  If any of your floors are uneven, fix them.  If there are any pets around you, be aware of where they are.  Review your medicines with your doctor. Some medicines can make you feel dizzy. This can increase your chance of falling. Ask your doctor what other things that you can do to help prevent falls. This information is not intended to replace advice given to you by your health care provider. Make sure you discuss any questions you have with your health care provider. Document Released: 12/02/2008 Document Revised: 07/14/2015 Document Reviewed: 03/12/2014 Elsevier Interactive Patient Education  2017 [employer].

## 2019-07-06 NOTE — Progress Notes (Signed)
PCP notes:  Health Maintenance: Colonoscopy- declined   Abnormal Screenings: none   Patient concerns: none   Nurse concerns: None   Next PCP appt.: 10/05/2019 @ 2 pm

## 2019-07-06 NOTE — Progress Notes (Signed)
Subjective:   Alice Smith is a 74 y.o. female who presents for Medicare Annual (Subsequent) preventive examination.  Review of Systems: N/A     I connected with the patient today by telephone and verified that I am speaking with the correct person using two identifiers. Location patient: home Location nurse: work Persons participating in the virtual visit: patient, Marine scientist.   I discussed the limitations, risks, security and privacy concerns of performing an evaluation and management service by telephone and the availability of in person appointments. I also discussed with the patient that there may be a patient responsible charge related to this service. The patient expressed understanding and verbally consented to this telephonic visit.    Interactive audio and video telecommunications were attempted between this nurse and patient, however failed, due to patient having technical difficulties OR patient did not have access to video capability.  We continued and completed visit with audio only.         Cardiac Risk Factors include: advanced age (>44men, >22 women);dyslipidemia     Objective:     Vitals: There were no vitals taken for this visit.  There is no height or weight on file to calculate BMI.  Advanced Directives 07/06/2019 07/23/2016 08/31/2013 08/28/2013 06/23/2013 06/16/2013  Does Patient Have a Medical Advance Directive? Yes No Patient has advance directive, copy not in chart Patient has advance directive, copy not in chart Patient has advance directive, copy not in chart Patient has advance directive, copy not in chart  Type of Advance Directive [place];Living will - [place];Living will [place];Living will [place];Living will [place];Living will  Does patient want to make changes to medical advance directive? - - No change requested No change requested - -  Copy of  [place] in Chart? No - copy requested - Copy requested from family Copy requested from family Copy requested from family Copy requested from family  Would patient like information on creating a medical advance directive? - No - Patient declined - - - -  Pre-existing out of facility DNR order (yellow form or pink MOST form) - - No No No -    Tobacco Social History   Tobacco Use  Smoking Status Former Smoker  . Packs/day: 1.00  . Years: 5.00  . Pack years: 5.00  . Types: Cigarettes  . Quit date: 06/17/1978  . Years since quitting: 41.0  Smokeless Tobacco Never Used  Tobacco Comment   5 pack year history     Counseling given: Not Answered Comment: 5 pack year history   Clinical Intake:  Pre-visit preparation completed: Yes  Pain : No/denies pain     Nutritional Risks: None Diabetes: No  How often do you need to have someone help you when you read instructions, pamphlets, or other written materials from your doctor or pharmacy?: 1 - Never What is the last grade level you completed in school?: GED  Interpreter Needed?: No  Information entered by :: CJohnson, LPN  Past Medical History:  Diagnosis Date  . Arthritis   . Breast mass 05/2001   benign  . Complication of anesthesia    slow to wake up x 1  . Concussion 2014, 05/2016  . Dry cough    at night  . Hemorrhoids   . Migraine    evaluated with Dr. Domingo Cocking 11/2009  . Osteoporosis    Past Surgical History:  Procedure Laterality Date  .  ABDOMINAL EXPLORATION SURGERY     for twisted bowel  . ABDOMINAL HYSTERECTOMY     yrs ago  . BREAST SURGERY  1989   aspirated lump left breast  . CARPAL TUNNEL RELEASE     right 03/2001, left 07/2001  . ESOPHAGOGASTRODUODENOSCOPY    . KNEE ARTHROSCOPY     rt knee  . nissenfundiplication    . PARTIAL HYSTERECTOMY  1989   dyspasia, ovaries left in  . PARTIAL KNEE ARTHROPLASTY Right 06/23/2013   Procedure: RIGHT KNEE MEDIAL UNICOMPARTMENTAL ARTHROPLASTY;   Surgeon: Mauri Pole, MD;  Location: [facility];  Service: Orthopedics;  Laterality: Right;  . PARTIAL KNEE ARTHROPLASTY Left 08/31/2013   Procedure: LEFT UNICOMPARTMENTAL KNEE ATHROPLASTY   (MEDIAL) ;  Surgeon: Mauri Pole, MD;  Location: [facility];  Service: Orthopedics;  Laterality: Left;  . ROTATOR CUFF REPAIR     right 03/2001, left 07/2001   Family History  Problem Relation Age of Onset  . Arthritis Mother   . Cancer Mother        lung cancer, dx'd in her 12s  . Cancer Father        throat  . COPD Brother   . Cancer Sister        breast  . Breast cancer Sister   . Colon cancer Neg Hx    Social History   Socioeconomic History  . Marital status: Married    Spouse name: Not on file  . Number of children: 3  . Years of education: Not on file  . Highest education level: Not on file  Occupational History  . Occupation: home  Tobacco Use  . Smoking status: Former Smoker    Packs/day: 1.00    Years: 5.00    Pack years: 5.00    Types: Cigarettes    Quit date: 06/17/1978    Years since quitting: 41.0  . Smokeless tobacco: Never Used  . Tobacco comment: 5 pack year history  Substance and Sexual Activity  . Alcohol use: No  . Drug use: No  . Sexual activity: Not on file  Other Topics Concern  . Not on file  Social History Narrative   Second marriage, 20. Has 3 adult children, adopted 4, 6 grandkids.   From [place], [place]   Education- GED, some college   Social Determinants of Health   Financial Resource Strain: [place]   . Difficulty of Paying Living Expenses: Not hard at all  Food Insecurity: No Food Insecurity  . Worried About Charity fundraiser in the Last Year: Never true  . Ran Out of Food in the Last Year: Never true  Transportation Needs: No Transportation Needs  . Lack of Transportation (Medical): No  . Lack of Transportation (Non-Medical): No  Physical Activity: Sufficiently Active  . Days of Exercise per Week: 3 days  . Minutes of Exercise per Session:  50 min  Stress: No Stress Concern Present  . Feeling of Stress : Not at all  Social Connections:   . Frequency of Communication with Friends and Family:   . Frequency of Social Gatherings with Friends and Family:   . Attends Religious Services:   . Active Member of Clubs or Organizations:   . Attends Archivist Meetings:   Marland Kitchen Marital Status:     Outpatient Encounter Medications as of 07/06/2019  Medication Sig  . alendronate (FOSAMAX) 70 MG tablet Held as of 03/2019  . Bacillus Coagulans-Inulin (PROBIOTIC) 1-250 BILLION-MG CAPS Take by mouth daily.  Marland Kitchen  calcium-vitamin D (OSCAL WITH D) 500-200 MG-UNIT tablet Take 1 tablet by mouth daily.  . Cholecalciferol (DIALYVITE VITAMIN D 5000) 125 MCG (5000 UT) capsule Take 1 capsule (5,000 Units total) by mouth daily.  Marland Kitchen donepezil (ARICEPT) 10 MG tablet TAKE 1 TABLET BY MOUTH AT  BEDTIME  . famotidine (PEPCID) 20 MG tablet Take 1 tablet (20 mg total) by mouth 2 (two) times daily.  Marland Kitchen loratadine (CLARITIN) 10 MG tablet Take 10 mg by mouth daily.  . memantine (NAMENDA) 10 MG tablet TAKE 1 TABLET BY MOUTH  TWICE DAILY   No facility-administered encounter medications on file as of 07/06/2019.    Activities of Daily Living In your present state of health, do you have any difficulty performing the following activities: 07/06/2019  Hearing? N  Vision? N  Difficulty concentrating or making decisions? N  Walking or climbing stairs? N  Dressing or bathing? N  Doing errands, shopping? N  Preparing Food and eating ? N  Using the Toilet? N  In the past six months, have you accidently leaked urine? N  Do you have problems with loss of bowel control? N  Managing your Medications? N  Managing your Finances? N  Housekeeping or managing your Housekeeping? N  Some recent data might be hidden    Patient Care Team: Tonia Ghent, MD as PCP - General    Assessment:   This is a routine wellness examination for Jaiana.  Exercise Activities and  Dietary recommendations Current Exercise Habits: Home exercise routine, Type of exercise: walking, Time (Minutes): 45, Frequency (Times/Week): 3, Weekly Exercise (Minutes/Week): 135, Intensity: Moderate, Exercise limited by: None identified  Goals    . Patient Stated     07/06/2019, I will continue to walk 3 days a week for 45 minutes.       Fall Risk Fall Risk  07/06/2019 08/06/2017 02/04/2017 01/25/2017 08/31/2014  Falls in the past year? 0 No Yes Yes Yes  Number falls in past yr: 0 - 1 1 1   Injury with Fall? 0 - Yes Yes No  Comment - - concussion - -  Risk for fall due to : Medication side effect - (No Data) - -  Risk for fall due to: Comment - - fell on sidewalk - -  Follow up Falls evaluation completed;Falls prevention discussed - - - -   Is the patient's home free of loose throw rugs in walkways, pet beds, electrical cords, etc?   yes      Grab bars in the bathroom? no      Handrails on the stairs?   yes      Adequate lighting?   yes  Timed Get Up and Go performed: N/A  Depression Screen PHQ 2/9 Scores 07/06/2019 07/25/2018 01/25/2017 08/31/2014  PHQ - 2 Score 0 0 0 0  PHQ- 9 Score 0 0 - -     Cognitive Function MMSE - Mini Mental State Exam 07/06/2019 02/23/2019 08/06/2017 02/04/2017  Not completed: Refused - - -  Orientation to time - 4 4 5   Orientation to Place - 3 3 5   Registration - 3 3 3   Attention/ Calculation - 0 0 0  Recall - 3 1 2   Language- name 2 objects - 2 2 2   Language- repeat - 1 1 0  Language- follow 3 step command - 3 3 3   Language- read & follow direction - 0 1 1  Write a sentence - 1 0 1  Write a sentence-comments - - No subject -  Copy design - 1 1 1   Copy design-comments - named 3 animals - -  Total score - 21 19 23   Mini Cog  Mini-Cog screen was not completed. Patient refused. Maximum score is 22. A value of 0 denotes this part of the MMSE was not completed or the patient failed this part of the Mini-Cog screening.       Immunization History    Administered Date(s) Administered  . Fluad Quad(high Dose 65+) 12/01/2018  . Influenza Whole 12/18/2007, 11/19/2009  . Influenza, High Dose Seasonal PF 10/09/2016, 12/09/2017  . Influenza-Unspecified 10/20/2012, 12/20/2013, 11/26/2014  . PFIZER SARS-COV-2 Vaccination 03/10/2019, 03/31/2019  . Pneumococcal Conjugate-13 08/31/2014  . Pneumococcal Polysaccharide-23 11/27/2004, 10/12/2011  . Td 02/26/2003  . Tdap 09/09/2014  . Zoster 01/29/2007    Qualifies for Shingles Vaccine: Yes  Screening Tests Health Maintenance  Topic Date Due  . COLONOSCOPY  07/05/2020 (Originally 01/06/2018)  . INFLUENZA VACCINE  09/20/2019  . MAMMOGRAM  10/08/2020  . TETANUS/TDAP  09/08/2024  . DEXA SCAN  Completed  . COVID-19 Vaccine  Completed  . Hepatitis C Screening  Completed  . PNA vac Low Risk Adult  Completed    Cancer Screenings: Lung: Low Dose CT Chest recommended if Age 28-80 years, 30 pack-year currently smoking OR have quit w/in 15 years. Patient does not qualify. Breast:  Up to date on Mammogram: Yes, completed 10/09/2018   Up to date of Bone Density/Dexa: Yes, completed 10/09/2018 Colorectal: declined  Additional Screenings:  Hepatitis C Screening: 02/02/2015     Plan:    Patient will continue to walk 3 days a week for 45 minutes.    I have personally reviewed and noted the following in the patient's chart:   . Medical and social history . Use of alcohol, tobacco or illicit drugs  . Current medications and supplements . Functional ability and status . Nutritional status . Physical activity . Advanced directives . List of other physicians . Hospitalizations, surgeries, and ER visits in previous 12 months . Vitals . Screenings to include cognitive, depression, and falls . Referrals and appointments  In addition, I have reviewed and discussed with patient certain preventive protocols, quality metrics, and best practice recommendations. A written personalized care plan for  preventive services as well as general preventive health recommendations were provided to patient.     Andrez Grime, LPN  [phone]

## 2019-08-25 ENCOUNTER — Ambulatory Visit: Payer: Medicare Other | Admitting: Family Medicine

## 2019-08-25 ENCOUNTER — Encounter: Payer: Self-pay | Admitting: Family Medicine

## 2019-08-25 NOTE — Progress Notes (Deleted)
PATIENT: Alice Smith DOB: 11/30/1945  REASON FOR VISIT: follow up HISTORY FROM: patient  No chief complaint on file.    HISTORY OF PRESENT ILLNESS: Today 08/25/19 Alice Smith is a 74 y.o. female here today for follow up for dementia. She continues Aricept 10mg  daily and Namenda 10mg  BID. No obvious adverse effects.     HISTORY: (copied from my note on 02/23/2019)  Alice Smith is a 74 y.o. female here today for follow up for dementia. She continues Aricept 10mg  daily and Namenda 10mg  BID. She is tolerating medications well. Her husband is with her today and aids in history. He reports that memory is fairly stable. They have noticed some worsening with concentration. She is wanting to sit still more than usual. She continues to walk about 1 mile several times a week. She has had more urine and bowel incontinence. She has had two accidents over the past 6 months. She is wearing pads. She is eating well. She does seem to enjoy simple sugars. She is able to perform ADL's. She does not cook. She does not drive. No falls.   HISTORY: (copied from my note on 08/11/2018)  Alice Smith a 74 y.o.femalehere today for follow up of dementia.Alice Smith is accompanied by her husband,Mr. Jablonowski, who aids in obtaining history. He reports that overall she has been fairly stable since her last visit in June 2019. Unfortunately there seems to be some confusion on what medication she was taking. At her last visit, she was instructed to continue Namenda 10 mg twice daily.Dr. Leta Baptist added Aricept 10 mg daily (titrating from 5mg  daily x 1 week).They are unsure how long she continued Namenda. It appears that it was discontinued from her medication list. Mr. Mulligan does not feel that she has taken this in some time. She did tolerate the medication well. She continues to perform all ADLs at home. She is able to dress bathe and feed herself. He reports that she can remember events  from long ago but struggles with short-term information. She does very rarely drive when accompanied by a family member. Her husband reports that this is only short distances and he either rides with her or follows her. He denies any falls or injuries. No behavioral concerns at this time.  HISTORY(copied from Dr Gladstone Lighter note on 08/06/2017)  UPDATE (08/06/17, VRP): Since last visit, doingslightly worse. Toleratingmemantine. No alleviating or aggravating factors.Driving less now. Some diff with shopping. MMSE 19/30. Ron Parker 6/6. [place] IADL 5/8.   PRIOR HPI (02/04/17):74 year old left-handed female here for evaluation of cognitive decline, confusion, lack of emotions, difficulty processing information.  Family has noted at least 2 years of gradual onset progressive confusion, fatigue, decreased processing speed, comprehension difficulty, decreased interest in day-to-day activity specifically household chores and cleanliness, which she used to be quite involved with. She is also shown flat and emotional affect, less nurturing, less social. She has had trouble with maintaining her checkbook, finances, shopping and ordering. In June 2018 she got lost in [place] when trying to pick up their grandson.  Overall patient having decreased speech output and fluency.  Patient also underwent 7 or 8 surgeries in 2014, and family noted that many symptoms started at that time.   REVIEW OF SYSTEMS: Out of a complete 14 system review of symptoms, the patient complains only of the following symptoms, and all other reviewed systems are negative.  ALLERGIES: Allergies  Allergen Reactions  . Clarithromycin     REACTION: nausea and vomiting  .  Erythromycin Ethylsuccinate     REACTION: GI upset  . Fluticasone-Salmeterol     REACTION: Sore tongue and rash  . Procaine Hcl     REACTION: swelling  . Sulfonamide Derivatives     REACTION: rash  . Topiramate     REACTION: confusion    HOME  MEDICATIONS: Outpatient Medications Prior to Visit  Medication Sig Dispense Refill  . alendronate (FOSAMAX) 70 MG tablet Held as of 03/2019    . Bacillus Coagulans-Inulin (PROBIOTIC) 1-250 BILLION-MG CAPS Take by mouth daily.    . calcium-vitamin D (OSCAL WITH D) 500-200 MG-UNIT tablet Take 1 tablet by mouth daily.    . Cholecalciferol (DIALYVITE VITAMIN D 5000) 125 MCG (5000 UT) capsule Take 1 capsule (5,000 Units total) by mouth daily.    Marland Kitchen donepezil (ARICEPT) 10 MG tablet TAKE 1 TABLET BY MOUTH AT  BEDTIME 90 tablet 3  . famotidine (PEPCID) 20 MG tablet Take 1 tablet (20 mg total) by mouth 2 (two) times daily. 180 tablet 1  . loratadine (CLARITIN) 10 MG tablet Take 10 mg by mouth daily.    . memantine (NAMENDA) 10 MG tablet TAKE 1 TABLET BY MOUTH  TWICE DAILY 180 tablet 3   No facility-administered medications prior to visit.    PAST MEDICAL HISTORY: Past Medical History:  Diagnosis Date  . Arthritis   . Breast mass 05/2001   benign  . Complication of anesthesia    slow to wake up x 1  . Concussion 2014, 05/2016  . Dry cough    at night  . Hemorrhoids   . Migraine    evaluated with Dr. Domingo Cocking 11/2009  . Osteoporosis     PAST SURGICAL HISTORY: Past Surgical History:  Procedure Laterality Date  . ABDOMINAL EXPLORATION SURGERY     for twisted bowel  . ABDOMINAL HYSTERECTOMY     yrs ago  . BREAST SURGERY  1989   aspirated lump left breast  . CARPAL TUNNEL RELEASE     right 03/2001, left 07/2001  . ESOPHAGOGASTRODUODENOSCOPY    . KNEE ARTHROSCOPY     rt knee  . nissenfundiplication    . PARTIAL HYSTERECTOMY  1989   dyspasia, ovaries left in  . PARTIAL KNEE ARTHROPLASTY Right 06/23/2013   Procedure: RIGHT KNEE MEDIAL UNICOMPARTMENTAL ARTHROPLASTY;  Surgeon: Mauri Pole, MD;  Location: [facility];  Service: Orthopedics;  Laterality: Right;  . PARTIAL KNEE ARTHROPLASTY Left 08/31/2013   Procedure: LEFT UNICOMPARTMENTAL KNEE ATHROPLASTY   (MEDIAL) ;  Surgeon: Mauri Pole, MD;   Location: [facility];  Service: Orthopedics;  Laterality: Left;  . ROTATOR CUFF REPAIR     right 03/2001, left 07/2001    FAMILY HISTORY: Family History  Problem Relation Age of Onset  . Arthritis Mother   . Cancer Mother        lung cancer, dx'd in her 79s  . Cancer Father        throat  . COPD Brother   . Cancer Sister        breast  . Breast cancer Sister   . Colon cancer Neg Hx     SOCIAL HISTORY: Social History   Socioeconomic History  . Marital status: Married    Spouse name: Not on file  . Number of children: 3  . Years of education: Not on file  . Highest education level: Not on file  Occupational History  . Occupation: home  Tobacco Use  . Smoking status: Former Smoker    Packs/day:  1.00    Years: 5.00    Pack years: 5.00    Types: Cigarettes    Quit date: 06/17/1978    Years since quitting: 41.2  . Smokeless tobacco: Never Used  . Tobacco comment: 5 pack year history  Substance and Sexual Activity  . Alcohol use: No  . Drug use: No  . Sexual activity: Not on file  Other Topics Concern  . Not on file  Social History Narrative   Second marriage, 1. Has 3 adult children, adopted 4, 6 grandkids.   From [place], [place]   Education- GED, some college   Social Determinants of Health   Financial Resource Strain: [place]   . Difficulty of Paying Living Expenses: Not hard at all  Food Insecurity: No Food Insecurity  . Worried About Charity fundraiser in the Last Year: Never true  . Ran Out of Food in the Last Year: Never true  Transportation Needs: No Transportation Needs  . Lack of Transportation (Medical): No  . Lack of Transportation (Non-Medical): No  Physical Activity: Sufficiently Active  . Days of Exercise per Week: 3 days  . Minutes of Exercise per Session: 50 min  Stress: No Stress Concern Present  . Feeling of Stress : Not at all  Social Connections:   . Frequency of Communication with Friends and Family:   . Frequency of Social Gatherings  with Friends and Family:   . Attends Religious Services:   . Active Member of Clubs or Organizations:   . Attends Archivist Meetings:   Marland Kitchen Marital Status:   Intimate Partner Violence: Not At Risk  . Fear of Current or Ex-Partner: No  . Emotionally Abused: No  . Physically Abused: No  . Sexually Abused: No      PHYSICAL EXAM  There were no vitals filed for this visit. There is no height or weight on file to calculate BMI.  Generalized: Well developed, in no acute distress  Cardiology: normal rate and rhythm, no murmur noted Respiratory: clear to auscultation bilaterally  Neurological examination  Mentation: Alert oriented to time, place, history taking. Follows all commands speech and language fluent Cranial nerve II-XII: Pupils were equal round reactive to light. Extraocular movements were full, visual field were full on confrontational test. Facial sensation and strength were normal. Uvula tongue midline. Head turning and shoulder shrug  were normal and symmetric. Motor: The motor testing reveals 5 over 5 strength of all 4 extremities. Good symmetric motor tone is noted throughout.  Sensory: Sensory testing is intact to soft touch on all 4 extremities. No evidence of extinction is noted.  Coordination: Cerebellar testing reveals good finger-nose-finger and heel-to-shin bilaterally.  Gait and station: Gait is normal. Tandem gait is normal. Romberg is negative. No drift is seen.  Reflexes: Deep tendon reflexes are symmetric and normal bilaterally.   DIAGNOSTIC DATA (LABS, IMAGING, TESTING) - I reviewed patient records, labs, notes, testing and imaging myself where available.  MMSE - Mini Mental State Exam 07/06/2019 02/23/2019 08/06/2017  Not completed: Refused - -  Orientation to time - 4 4  Orientation to Place - 3 3  Registration - 3 3  Attention/ Calculation - 0 0  Recall - 3 1  Language- name 2 objects - 2 2  Language- repeat - 1 1  Language- follow 3 step command  - 3 3  Language- read & follow direction - 0 1  Write a sentence - 1 0  Write a sentence-comments - - No  subject  Copy design - 1 1  Copy design-comments - named 3 animals -  Total score - 21 19     Lab Results  Component Value Date   WBC 6.4 03/30/2019   HGB 14.0 03/30/2019   HCT 41.8 03/30/2019   MCV 91.0 03/30/2019   PLT 285.0 03/30/2019      Component Value Date/Time   NA 143 03/30/2019 1048   K 4.1 03/30/2019 1048   CL 107 03/30/2019 1048   CO2 27 03/30/2019 1048   GLUCOSE 114 (H) 03/30/2019 1048   BUN 18 03/30/2019 1048   CREATININE 0.78 03/30/2019 1048   CREATININE 0.81 07/25/2018 1527   CALCIUM 9.5 03/30/2019 1048   PROT 7.0 03/30/2019 1048   ALBUMIN 3.9 03/30/2019 1048   AST 19 03/30/2019 1048   ALT 19 03/30/2019 1048   ALKPHOS 99 03/30/2019 1048   BILITOT 0.7 03/30/2019 1048   GFRNONAA 90 (L) 09/01/2013 0452   GFRAA >90 09/01/2013 0452   Lab Results  Component Value Date   CHOL 220 (H) 03/30/2019   HDL 50.50 03/30/2019   LDLCALC 143 (H) 03/30/2019   LDLDIRECT 129.8 10/01/2011   TRIG 133.0 03/30/2019   CHOLHDL 4 03/30/2019   No results found for: HGBA1C Lab Results  Component Value Date   VITAMINB12 428 01/22/2017   Lab Results  Component Value Date   TSH 3.53 03/30/2019       ASSESSMENT AND PLAN 74 y.o. year old female  has a past medical history of Arthritis, Breast mass (08/261), Complication of anesthesia, Concussion (2014, 05/2016), Dry cough, Hemorrhoids, Migraine, and Osteoporosis. here with ***  No diagnosis found.     No orders of the defined types were placed in this encounter.    No orders of the defined types were placed in this encounter.     I spent 15 minutes with the patient. 50% of this time was spent counseling and educating patient on plan of care and medications.    Debbora Presto, FNP-C 08/25/2019, 1:23 PM [facility] [address] [place], [place] [address] [phone]

## 2019-10-05 ENCOUNTER — Ambulatory Visit: Payer: Medicare Other | Admitting: Family Medicine

## 2019-10-06 ENCOUNTER — Ambulatory Visit: Payer: Medicare Other | Admitting: Family Medicine

## 2019-11-18 ENCOUNTER — Encounter: Payer: Self-pay | Admitting: Family Medicine

## 2019-11-18 ENCOUNTER — Ambulatory Visit (INDEPENDENT_AMBULATORY_CARE_PROVIDER_SITE_OTHER): Payer: Medicare Other | Admitting: Family Medicine

## 2019-11-18 ENCOUNTER — Other Ambulatory Visit: Payer: Self-pay

## 2019-11-18 VITALS — BP 133/69 | HR 90 | Ht 62.0 in | Wt 197.0 lb

## 2019-11-18 DIAGNOSIS — F0390 Unspecified dementia without behavioral disturbance: Secondary | ICD-10-CM | POA: Diagnosis not present

## 2019-11-18 DIAGNOSIS — F03B0 Unspecified dementia, moderate, without behavioral disturbance, psychotic disturbance, mood disturbance, and anxiety: Secondary | ICD-10-CM

## 2019-11-18 NOTE — Patient Instructions (Addendum)
Continue Aricept and Namenda. May consider escitalopram or sertraline if there are any concerns of depression.   Stay well hydrated. Well balanced diet and regular exercise encouraged.   Follow up in 6 months   Escitalopram tablets What is this medicine? ESCITALOPRAM (es sye TAL oh pram) is used to treat depression and certain types of anxiety. This medicine may be used for other purposes; ask your health care provider or pharmacist if you have questions. COMMON BRAND NAME(S): Lexapro What should I tell my health care provider before I take this medicine? They need to know if you have any of these conditions:  bipolar disorder or a family history of bipolar disorder  diabetes  glaucoma  heart disease  kidney or liver disease  receiving electroconvulsive therapy  seizures (convulsions)  suicidal thoughts, plans, or attempt by you or a family member  an unusual or allergic reaction to escitalopram, the related drug citalopram, other medicines, foods, dyes, or preservatives  pregnant or trying to become pregnant  breast-feeding How should I use this medicine? Take this medicine by mouth with a glass of water. Follow the directions on the prescription label. You can take it with or without food. If it upsets your stomach, take it with food. Take your medicine at regular intervals. Do not take it more often than directed. Do not stop taking this medicine suddenly except upon the advice of your doctor. Stopping this medicine too quickly may cause serious side effects or your condition may worsen. A special MedGuide will be given to you by the pharmacist with each prescription and refill. Be sure to read this information carefully each time. Talk to your pediatrician regarding the use of this medicine in children. Special care may be needed. Overdosage: If you think you have taken too much of this medicine contact a poison control center or emergency room at once. NOTE: This  medicine is only for you. Do not share this medicine with others. What if I miss a dose? If you miss a dose, take it as soon as you can. If it is almost time for your next dose, take only that dose. Do not take double or extra doses. What may interact with this medicine? Do not take this medicine with any of the following medications:  certain medicines for fungal infections like fluconazole, itraconazole, ketoconazole, posaconazole, voriconazole  cisapride  citalopram  dronedarone  linezolid  MAOIs like Carbex, Eldepryl, Marplan, Nardil, and Parnate  methylene blue (injected into a vein)  pimozide  thioridazine This medicine may also interact with the following medications:  alcohol  amphetamines  aspirin and aspirin-like medicines  carbamazepine  certain medicines for depression, anxiety, or psychotic disturbances  certain medicines for migraine headache like almotriptan, eletriptan, frovatriptan, naratriptan, rizatriptan, sumatriptan, zolmitriptan  certain medicines for sleep  certain medicines that treat or prevent blood clots like warfarin, enoxaparin, dalteparin  cimetidine  diuretics  dofetilide  fentanyl  furazolidone  isoniazid  lithium  metoprolol  NSAIDs, medicines for pain and inflammation, like ibuprofen or naproxen  other medicines that prolong the QT interval (cause an abnormal heart rhythm)  procarbazine  rasagiline  supplements like St. John's wort, kava kava, valerian  tramadol  tryptophan  ziprasidone This list may not describe all possible interactions. Give your health care provider a list of all the medicines, herbs, non-prescription drugs, or dietary supplements you use. Also tell them if you smoke, drink alcohol, or use illegal drugs. Some items may interact with your medicine. What should  I watch for while using this medicine? Tell your doctor if your symptoms do not get better or if they get worse. Visit your doctor  or health care professional for regular checks on your progress. Because it may take several weeks to see the full effects of this medicine, it is important to continue your treatment as prescribed by your doctor. Patients and their families should watch out for new or worsening thoughts of suicide or depression. Also watch out for sudden changes in feelings such as feeling anxious, agitated, panicky, irritable, hostile, aggressive, impulsive, severely restless, overly excited and hyperactive, or not being able to sleep. If this happens, especially at the beginning of treatment or after a change in dose, call your health care professional. Dennis Bast may get drowsy or dizzy. Do not drive, use machinery, or do anything that needs mental alertness until you know how this medicine affects you. Do not stand or sit up quickly, especially if you are an older patient. This reduces the risk of dizzy or fainting spells. Alcohol may interfere with the effect of this medicine. Avoid alcoholic drinks. Your mouth may get dry. Chewing sugarless gum or sucking hard candy, and drinking plenty of water may help. Contact your doctor if the problem does not go away or is severe. What side effects may I notice from receiving this medicine? Side effects that you should report to your doctor or health care professional as soon as possible:  allergic reactions like skin rash, itching or hives, swelling of the face, lips, or tongue  anxious  black, tarry stools  changes in vision  confusion  elevated mood, decreased need for sleep, racing thoughts, impulsive behavior  eye pain  fast, irregular heartbeat  feeling faint or lightheaded, falls  feeling agitated, angry, or irritable  hallucination, loss of contact with reality  loss of balance or coordination  loss of memory  painful or prolonged erections  restlessness, pacing, inability to keep still  seizures  stiff muscles  suicidal thoughts or other mood  changes  trouble sleeping  unusual bleeding or bruising  unusually weak or tired  vomiting Side effects that usually do not require medical attention (report to your doctor or health care professional if they continue or are bothersome):  changes in appetite  change in sex drive or performance  headache  increased sweating  indigestion, nausea  tremors This list may not describe all possible side effects. Call your doctor for medical advice about side effects. You may report side effects to FDA at 1-800-FDA-1088. Where should I keep my medicine? Keep out of reach of children. Store at room temperature between 15 and 30 degrees C (59 and 86 degrees F). Throw away any unused medicine after the expiration date. NOTE: This sheet is a summary. It may not cover all possible information. If you have questions about this medicine, talk to your doctor, pharmacist, or health care provider.  2020 Elsevier/Gold Standard (2018-01-27 11:21:44)   Sertraline tablets What is this medicine? SERTRALINE (SER tra leen) is used to treat depression. It may also be used to treat obsessive compulsive disorder, panic disorder, post-trauma stress, premenstrual dysphoric disorder (PMDD) or social anxiety. This medicine may be used for other purposes; ask your health care provider or pharmacist if you have questions. COMMON BRAND NAME(S): Zoloft What should I tell my health care provider before I take this medicine? They need to know if you have any of these conditions:  bleeding disorders  bipolar disorder or a family history  of bipolar disorder  glaucoma  heart disease  high blood pressure  history of irregular heartbeat  history of low levels of calcium, magnesium, or potassium in the blood  if you often drink alcohol  liver disease  receiving electroconvulsive therapy  seizures  suicidal thoughts, plans, or attempt; a previous suicide attempt by you or a family member  take  medicines that treat or prevent blood clots  thyroid disease  an unusual or allergic reaction to sertraline, other medicines, foods, dyes, or preservatives  pregnant or trying to get pregnant  breast-feeding How should I use this medicine? Take this medicine by mouth with a glass of water. Follow the directions on the prescription label. You can take it with or without food. Take your medicine at regular intervals. Do not take your medicine more often than directed. Do not stop taking this medicine suddenly except upon the advice of your doctor. Stopping this medicine too quickly may cause serious side effects or your condition may worsen. A special MedGuide will be given to you by the pharmacist with each prescription and refill. Be sure to read this information carefully each time. Talk to your pediatrician regarding the use of this medicine in children. While this drug may be prescribed for children as young as 7 years for selected conditions, precautions do apply. Overdosage: If you think you have taken too much of this medicine contact a poison control center or emergency room at once. NOTE: This medicine is only for you. Do not share this medicine with others. What if I miss a dose? If you miss a dose, take it as soon as you can. If it is almost time for your next dose, take only that dose. Do not take double or extra doses. What may interact with this medicine? Do not take this medicine with any of the following medications:  cisapride  dronedarone  linezolid  MAOIs like Carbex, Eldepryl, Marplan, Nardil, and Parnate  methylene blue (injected into a vein)  pimozide  thioridazine This medicine may also interact with the following medications:  alcohol  amphetamines  aspirin and aspirin-like medicines  certain medicines for depression, anxiety, or psychotic disturbances  certain medicines for fungal infections like ketoconazole, fluconazole, posaconazole, and  itraconazole  certain medicines for irregular heart beat like flecainide, quinidine, propafenone  certain medicines for migraine headaches like almotriptan, eletriptan, frovatriptan, naratriptan, rizatriptan, sumatriptan, zolmitriptan  certain medicines for sleep  certain medicines for seizures like carbamazepine, valproic acid, phenytoin  certain medicines that treat or prevent blood clots like warfarin, enoxaparin, dalteparin  cimetidine  digoxin  diuretics  fentanyl  isoniazid  lithium  NSAIDs, medicines for pain and inflammation, like ibuprofen or naproxen  other medicines that prolong the QT interval (cause an abnormal heart rhythm) like dofetilide  rasagiline  safinamide  supplements like St. John's wort, kava kava, valerian  tolbutamide  tramadol  tryptophan This list may not describe all possible interactions. Give your health care provider a list of all the medicines, herbs, non-prescription drugs, or dietary supplements you use. Also tell them if you smoke, drink alcohol, or use illegal drugs. Some items may interact with your medicine. What should I watch for while using this medicine? Tell your doctor if your symptoms do not get better or if they get worse. Visit your doctor or health care professional for regular checks on your progress. Because it may take several weeks to see the full effects of this medicine, it is important to continue your treatment as  prescribed by your doctor. Patients and their families should watch out for new or worsening thoughts of suicide or depression. Also watch out for sudden changes in feelings such as feeling anxious, agitated, panicky, irritable, hostile, aggressive, impulsive, severely restless, overly excited and hyperactive, or not being able to sleep. If this happens, especially at the beginning of treatment or after a change in dose, call your health care professional. Dennis Bast may get drowsy or dizzy. Do not drive, use  machinery, or do anything that needs mental alertness until you know how this medicine affects you. Do not stand or sit up quickly, especially if you are an older patient. This reduces the risk of dizzy or fainting spells. Alcohol may interfere with the effect of this medicine. Avoid alcoholic drinks. Your mouth may get dry. Chewing sugarless gum or sucking hard candy, and drinking plenty of water may help. Contact your doctor if the problem does not go away or is severe. What side effects may I notice from receiving this medicine? Side effects that you should report to your doctor or health care professional as soon as possible:  allergic reactions like skin rash, itching or hives, swelling of the face, lips, or tongue  anxious  black, tarry stools  changes in vision  confusion  elevated mood, decreased need for sleep, racing thoughts, impulsive behavior  eye pain  fast, irregular heartbeat  feeling faint or lightheaded, falls  feeling agitated, angry, or irritable  hallucination, loss of contact with reality  loss of balance or coordination  loss of memory  painful or prolonged erections  restlessness, pacing, inability to keep still  seizures  stiff muscles  suicidal thoughts or other mood changes  trouble sleeping  unusual bleeding or bruising  unusually weak or tired  vomiting Side effects that usually do not require medical attention (report to your doctor or health care professional if they continue or are bothersome):  change in appetite or weight  change in sex drive or performance  diarrhea  increased sweating  indigestion, nausea  tremors This list may not describe all possible side effects. Call your doctor for medical advice about side effects. You may report side effects to FDA at 1-800-FDA-1088. Where should I keep my medicine? Keep out of the reach of children. Store at room temperature between 15 and 30 degrees C (59 and 86 degrees F).  Throw away any unused medicine after the expiration date. NOTE: This sheet is a summary. It may not cover all possible information. If you have questions about this medicine, talk to your doctor, pharmacist, or health care provider.  2020 Elsevier/Gold Standard (2018-01-28 10:09:27)   Memory Compensation Strategies  1. Use "WARM" strategy.  W= write it down  A= associate it  R= repeat it  M= make a mental note  2.   You can keep a Social worker.  Use a 3-ring notebook with sections for the following: calendar, important names and phone numbers,  medications, doctors' names/phone numbers, lists/reminders, and a section to journal what you did  each day.   3.    Use a calendar to write appointments down.  4.    Write yourself a schedule for the day.  This can be placed on the calendar or in a separate section of the Memory Notebook.  Keeping a  regular schedule can help memory.  5.    Use medication organizer with sections for each day or morning/evening pills.  You may need help loading it  6.  Keep a basket, or pegboard by the door.  Place items that you need to take out with you in the basket or on the pegboard.  You may also want to  include a message board for reminders.  7.    Use sticky notes.  Place sticky notes with reminders in a place where the task is performed.  For example: " turn off the  stove" placed by the stove, "lock the door" placed on the door at eye level, " take your medications" on  the bathroom mirror or by the place where you normally take your medications.  8.    Use alarms/timers.  Use while cooking to remind yourself to check on food or as a reminder to take your medicine, or as a  reminder to make a call, or as a reminder to perform another task, etc.   Dementia Caregiver Guide Dementia is a term used to describe a number of symptoms that affect memory and thinking. The most common symptoms include:  Memory loss.  Trouble with language and  communication.  Trouble concentrating.  Poor judgment.  Problems with reasoning.  Child-like behavior and language.  Extreme anxiety.  Angry outbursts.  Wandering from home or public places. Dementia usually gets worse slowly over time. In the early stages, people with dementia can stay independent and safe with some help. In later stages, they need help with daily tasks such as dressing, grooming, and using the bathroom. How to help the person with dementia cope Dementia can be frightening and confusing. Here are some tips to help the person with dementia cope with changes caused by the disease. General tips  Keep the person on track with his or her routine.  Try to identify areas where the person may need help.  Be supportive, patient, calm, and encouraging.  Gently remind the person that adjusting to changes takes time.  Help with the tasks that the person has asked for help with.  Keep the person involved in daily tasks and decisions as much as possible.  Encourage conversation, but try not to get frustrated or harried if the person struggles to find words or does not seem to appreciate your help. Communication tips  When the person is talking or seems frustrated, make eye contact and hold the person's hand.  Ask specific questions that need yes or no answers.  Use simple words, short sentences, and a calm voice. Only give one direction at a time.  When offering choices, limit them to just 1 or 2.  Avoid correcting the person in a negative way.  If the person is struggling to find the right words, gently try to help him or her. How to recognize symptoms of stress Symptoms of stress in caregivers include:  Feeling frustrated or angry with the person with dementia.  Denying that the person has dementia or that his or her symptoms will not improve.  Feeling hopeless and unappreciated.  Difficulty sleeping.  Difficulty concentrating.  Feeling anxious,  irritable, or depressed.  Developing stress-related health problems.  Feeling like you have too little time for your own life. Follow these instructions at home:   Make sure that you and the person you are caring for: ? Get regular sleep. ? Exercise regularly. ? Eat regular, nutritious meals. ? Drink enough fluid to keep your urine clear or pale yellow. ? Take over-the-counter and prescription medicines only as told by your health care providers. ? Attend all scheduled health care appointments.  Join a support  group with others who are caregivers.  Ask about respite care resources so that you can have a regular break from the stress of caregiving.  Look for signs of stress in yourself and in the person you are caring for. If you notice signs of stress, take steps to manage it.  Consider any safety risks and take steps to avoid them.  Organize medications in a pill box for each day of the week.  Create a plan to handle any legal or financial matters. Get legal or financial advice if needed.  Keep a calendar in a central location to remind the person of appointments or other activities. Tips for reducing the risk of injury  Keep floors clear of clutter. Remove rugs, magazine racks, and floor lamps.  Keep hallways well lit, especially at night.  Put a handrail and nonslip mat in the bathtub or shower.  Put childproof locks on cabinets that contain dangerous items, such as medicines, alcohol, guns, toxic cleaning items, sharp tools or utensils, matches, and lighters.  Put the locks in places where the person cannot see or reach them easily. This will help ensure that the person does not wander out of the house and get lost.  Be prepared for emergencies. Keep a list of emergency phone numbers and addresses in a convenient area.  Remove car keys and lock garage doors so that the person does not try to get in the car and drive.  Have the person wear a bracelet that tracks  locations and identifies the person as having memory problems. This should be worn at all times for safety. Where to find support: Many individuals and organizations offer support. These include:  Support groups for people with dementia and for caregivers.  Counselors or therapists.  Home health care services.  Adult day care centers. Where to find more information [employer]: [employer] Contact a health care provider if:  The person's health is rapidly getting worse.  You are no longer able to care for the person.  Caring for the person is affecting your physical and emotional health.  The person threatens himself or herself, you, or anyone else. Summary  Dementia is a term used to describe a number of symptoms that affect memory and thinking.  Dementia usually gets worse slowly over time.  Take steps to reduce the person's risk of injury, and to plan for future care.  Caregivers need support, relief from caregiving, and time for their own lives. This information is not intended to replace advice given to you by your health care provider. Make sure you discuss any questions you have with your health care provider. Document Revised: 01/18/2017 Document Reviewed: 01/10/2016 Elsevier Patient Education  2020 [employer].

## 2019-11-18 NOTE — Progress Notes (Signed)
PATIENT: Alice Smith DOB: [birth year]  REASON FOR VISIT: follow up HISTORY FROM: patient  Chief Complaint  Patient presents with  . Follow-up    rm 2  . Memory Loss    mmse 14     HISTORY OF PRESENT ILLNESS: Today 11/18/19 Alice Smith is a 74 y.o. female here today for follow up for dementia. She continues Aricept and Namenda. She is tolerating medications well. Mr Arciniega is helping to dose medications. She continues ADL's independently. She is eating and drinking normally. She has had several episode of bowel incontinence. She is being treated for constipation. This has helped. She is not comfortable using public restrooms. Memory waxes and wanes. She continues to have lack of interest. She has a flat affect. No changes in movement. She is walking 2 miles a day. She continues to go to church.   HISTORY: (copied from my note on 02/23/2019)  Alice Smith is a 74 y.o. female here today for follow up for dementia. She continues Aricept 10mg  daily and Namenda 10mg  BID. She is tolerating medications well. Her husband is with her today and aids in history. He reports that memory is fairly stable. They have noticed some worsening with concentration. She is wanting to sit still more than usual. She continues to walk about 1 mile several times a week. She has had more urine and bowel incontinence. She has had two accidents over the past 6 months. She is wearing pads. She is eating well. She does seem to enjoy simple sugars. She is able to perform ADL's. She does not cook. She does not drive. No falls.   HISTORY: (copied from my note on 08/11/2018)  Alice Smith a 74 y.o.femalehere today for follow up of dementia.Alice Smith is accompanied by her husband,Mr. Albaugh, who aids in obtaining history. He reports that overall she has been fairly stable since her last visit in June 2019. Unfortunately there seems to be some confusion on what medication she was taking. At her last  visit, she was instructed to continue Namenda 10 mg twice daily.Dr. Leta Baptist added Aricept 10 mg daily (titrating from 5mg  daily x 1 week).They are unsure how long she continued Namenda. It appears that it was discontinued from her medication list. Mr. Socarras does not feel that she has taken this in some time. She did tolerate the medication well. She continues to perform all ADLs at home. She is able to dress bathe and feed herself. He reports that she can remember events from long ago but struggles with short-term information. She does very rarely drive when accompanied by a family member. Her husband reports that this is only short distances and he either rides with her or follows her. He denies any falls or injuries. No behavioral concerns at this time.  HISTORY(copied from Dr Gladstone Lighter note on 08/06/2017)  UPDATE (08/06/17, VRP): Since last visit, doingslightly worse. Toleratingmemantine. No alleviating or aggravating factors.Driving less now. Some diff with shopping. MMSE 19/30. Ron Parker 6/6. [place] IADL 5/8.   PRIOR HPI (02/04/17):74 year old left-handed female here for evaluation of cognitive decline, confusion, lack of emotions, difficulty processing information.  Family has noted at least 2 years of gradual onset progressive confusion, fatigue, decreased processing speed, comprehension difficulty, decreased interest in day-to-day activity specifically household chores and cleanliness, which she used to be quite involved with. She is also shown flat and emotional affect, less nurturing, less social. She has had trouble with maintaining her checkbook, finances, shopping and ordering. In June  2018 she got lost in [place] when trying to pick up their grandson.  Overall patient having decreased speech output and fluency.  Patient also underwent 7 or 8 surgeries in 2014, and family noted that many symptoms started at that time   REVIEW OF SYSTEMS: Out of a complete  14 system review of symptoms, the patient complains only of the following symptoms, memory loss, and all other reviewed systems are negative.   ALLERGIES: Allergies  Allergen Reactions  . Clarithromycin     REACTION: nausea and vomiting  . Erythromycin Ethylsuccinate     REACTION: GI upset  . Fluticasone-Salmeterol     REACTION: Sore tongue and rash  . Procaine Hcl     REACTION: swelling  . Sulfonamide Derivatives     REACTION: rash  . Topiramate     REACTION: confusion    HOME MEDICATIONS: Outpatient Medications Prior to Visit  Medication Sig Dispense Refill  . alendronate (FOSAMAX) 70 MG tablet Held as of 03/2019    . Bacillus Coagulans-Inulin (PROBIOTIC) 1-250 BILLION-MG CAPS Take by mouth daily.    . calcium-vitamin D (OSCAL WITH D) 500-200 MG-UNIT tablet Take 1 tablet by mouth daily.    . Cholecalciferol (DIALYVITE VITAMIN D 5000) 125 MCG (5000 UT) capsule Take 1 capsule (5,000 Units total) by mouth daily.    Marland Kitchen donepezil (ARICEPT) 10 MG tablet TAKE 1 TABLET BY MOUTH AT  BEDTIME 90 tablet 3  . famotidine (PEPCID) 20 MG tablet Take 1 tablet (20 mg total) by mouth 2 (two) times daily. 180 tablet 1  . loratadine (CLARITIN) 10 MG tablet Take 10 mg by mouth daily.    . memantine (NAMENDA) 10 MG tablet TAKE 1 TABLET BY MOUTH  TWICE DAILY 180 tablet 3   No facility-administered medications prior to visit.    PAST MEDICAL HISTORY: Past Medical History:  Diagnosis Date  . Arthritis   . Breast mass 05/2001   benign  . Complication of anesthesia    slow to wake up x 1  . Concussion 2014, 05/2016  . Dry cough    at night  . Hemorrhoids   . Migraine    evaluated with Dr. Domingo Cocking 11/2009  . Osteoporosis     PAST SURGICAL HISTORY: Past Surgical History:  Procedure Laterality Date  . ABDOMINAL EXPLORATION SURGERY     for twisted bowel  . ABDOMINAL HYSTERECTOMY     yrs ago  . BREAST SURGERY  1989   aspirated lump left breast  . CARPAL TUNNEL RELEASE     right 03/2001,  left 07/2001  . ESOPHAGOGASTRODUODENOSCOPY    . KNEE ARTHROSCOPY     rt knee  . nissenfundiplication    . PARTIAL HYSTERECTOMY  1989   dyspasia, ovaries left in  . PARTIAL KNEE ARTHROPLASTY Right 06/23/2013   Procedure: RIGHT KNEE MEDIAL UNICOMPARTMENTAL ARTHROPLASTY;  Surgeon: Mauri Pole, MD;  Location: [facility];  Service: Orthopedics;  Laterality: Right;  . PARTIAL KNEE ARTHROPLASTY Left 08/31/2013   Procedure: LEFT UNICOMPARTMENTAL KNEE ATHROPLASTY   (MEDIAL) ;  Surgeon: Mauri Pole, MD;  Location: [facility];  Service: Orthopedics;  Laterality: Left;  . ROTATOR CUFF REPAIR     right 03/2001, left 07/2001    FAMILY HISTORY: Family History  Problem Relation Age of Onset  . Arthritis Mother   . Cancer Mother        lung cancer, dx'd in her 59s  . Cancer Father        throat  . COPD  Brother   . Cancer Sister        breast  . Breast cancer Sister   . Colon cancer Neg Hx     SOCIAL HISTORY: Social History   Socioeconomic History  . Marital status: Married    Spouse name: Not on file  . Number of children: 3  . Years of education: Not on file  . Highest education level: Not on file  Occupational History  . Occupation: home  Tobacco Use  . Smoking status: Former Smoker    Packs/day: 1.00    Years: 5.00    Pack years: 5.00    Types: Cigarettes    Quit date: 06/17/1978    Years since quitting: 41.4  . Smokeless tobacco: Never Used  . Tobacco comment: 5 pack year history  Substance and Sexual Activity  . Alcohol use: No  . Drug use: No  . Sexual activity: Not on file  Other Topics Concern  . Not on file  Social History Narrative   Second marriage, 36. Has 3 adult children, adopted 4, 6 grandkids.   From [place], [place]   Education- GED, some college   Social Determinants of Health   Financial Resource Strain: [place]   . Difficulty of Paying Living Expenses: Not hard at all  Food Insecurity: No Food Insecurity  . Worried About Charity fundraiser in the Last  Year: Never true  . Ran Out of Food in the Last Year: Never true  Transportation Needs: No Transportation Needs  . Lack of Transportation (Medical): No  . Lack of Transportation (Non-Medical): No  Physical Activity: Sufficiently Active  . Days of Exercise per Week: 3 days  . Minutes of Exercise per Session: 50 min  Stress: No Stress Concern Present  . Feeling of Stress : Not at all  Social Connections:   . Frequency of Communication with Friends and Family: Not on file  . Frequency of Social Gatherings with Friends and Family: Not on file  . Attends Religious Services: Not on file  . Active Member of Clubs or Organizations: Not on file  . Attends Archivist Meetings: Not on file  . Marital Status: Not on file  Intimate Partner Violence: Not At Risk  . Fear of Current or Ex-Partner: No  . Emotionally Abused: No  . Physically Abused: No  . Sexually Abused: No      PHYSICAL EXAM  Vitals:   11/18/19 1352  BP: 133/69  Pulse: 90  Weight: 197 lb (89.4 kg)  Height: 5\' 2"  (1.575 m)   Body mass index is 36.03 kg/m.  Generalized: Well developed, in no acute distress, flat affect  Cardiology: normal rate and rhythm, no murmur noted Respiratory: clear to auscultation bilaterally  Neurological examination  Mentation: Alert, not oriented to time, she is oriented to city and state, not able to assist with history taking. Follows all commands speech and language fluent. Repetitive in questioning.  Cranial nerve II-XII: Pupils were equal round reactive to light. Extraocular movements were full, visual field were full on confrontational test. Facial sensation and strength were normal. Uvula tongue midline. Head turning and shoulder shrug  were normal and symmetric. Motor: The motor testing reveals 5 over 5 strength of all 4 extremities. Good symmetric motor tone is noted throughout. No bradykinesia noted. No cogwheel rigidity.  Sensory: Sensory testing is intact to soft touch on  all 4 extremities. No evidence of extinction is noted.  Coordination: Cerebellar testing reveals good finger-nose-finger and heel-to-shin  bilaterally.  Gait and station: Gait is normal. Normal posture.  Reflexes: Deep tendon reflexes are symmetric and normal bilaterally.   DIAGNOSTIC DATA (LABS, IMAGING, TESTING) - I reviewed patient records, labs, notes, testing and imaging myself where available.  MMSE - Mini Mental State Exam 11/18/2019 07/06/2019 02/23/2019  Not completed: - Refused -  Orientation to time 1 - 4  Orientation to Place 2 - 3  Registration 3 - 3  Attention/ Calculation 0 - 0  Recall 2 - 3  Language- name 2 objects 2 - 2  Language- repeat 1 - 1  Language- follow 3 step command 3 - 3  Language- read & follow direction 0 - 0  Write a sentence 0 - 1  Write a sentence-comments - - -  Copy design 0 - 1  Copy design-comments - - named 3 animals  Total score 14 - 21     Lab Results  Component Value Date   WBC 6.4 03/30/2019   HGB 14.0 03/30/2019   HCT 41.8 03/30/2019   MCV 91.0 03/30/2019   PLT 285.0 03/30/2019      Component Value Date/Time   NA 143 03/30/2019 1048   K 4.1 03/30/2019 1048   CL 107 03/30/2019 1048   CO2 27 03/30/2019 1048   GLUCOSE 114 (H) 03/30/2019 1048   BUN 18 03/30/2019 1048   CREATININE 0.78 03/30/2019 1048   CREATININE 0.81 07/25/2018 1527   CALCIUM 9.5 03/30/2019 1048   PROT 7.0 03/30/2019 1048   ALBUMIN 3.9 03/30/2019 1048   AST 19 03/30/2019 1048   ALT 19 03/30/2019 1048   ALKPHOS 99 03/30/2019 1048   BILITOT 0.7 03/30/2019 1048   GFRNONAA 90 (L) 09/01/2013 0452   GFRAA >90 09/01/2013 0452   Lab Results  Component Value Date   CHOL 220 (H) 03/30/2019   HDL 50.50 03/30/2019   LDLCALC 143 (H) 03/30/2019   LDLDIRECT 129.8 10/01/2011   TRIG 133.0 03/30/2019   CHOLHDL 4 03/30/2019   No results found for: HGBA1C Lab Results  Component Value Date   VITAMINB12 428 01/22/2017   Lab Results  Component Value Date   TSH 3.53  03/30/2019       ASSESSMENT AND PLAN 74 y.o. year old female  has a past medical history of Arthritis, Breast mass (04/2438), Complication of anesthesia, Concussion (2014, 05/2016), Dry cough, Hemorrhoids, Migraine, and Osteoporosis. here with     ICD-10-CM   1. Moderate dementia without behavioral disturbance ([place])  F03.90     Maecyn is doing fairly well. We will continue Aricept and Namenda as prescribed. I have discussed concerns of progression with her daughter. May consider treatment for depression if family feels this appropriate due to concerns of being withdrawn and lack of interest. We have discussed that this could be natural progression of disease. She appears happy and no other clear concerns of depression. I have provided education regarding commonly used medications and asked family to discuss at home. Would appreciate PCP input as well. She was encouraged to continue healthy lifestyle habits. She will stay active. She will follow up with me in 6 months.    No orders of the defined types were placed in this encounter.    No orders of the defined types were placed in this encounter.     I spent 25 minutes with the patient. 50% of this time was spent counseling and educating patient on plan of care and medications.     Debbora Presto, FNP-C 11/18/2019, 3:40 PM  [facility] [address] [place], [place] [address] [phone]

## 2019-11-20 ENCOUNTER — Telehealth: Payer: Self-pay | Admitting: Family Medicine

## 2019-11-20 NOTE — Telephone Encounter (Signed)
Hey there, Dr Damita Dunnings. I did speak with her daughter at length yesterday regarding her flat affect, being more withdrawn and lack of interest. I discussed with [name] that it was most likely related to progression of disease but we could consider depression treatment if they wished. I think they, too, felt that less was better. I definitely agree. Thank you so much for your input and I look forward to continue helping in her care.   Have a great weekend!   Amy       Previous Messages   ----- Message -----  From: Tonia Ghent, MD  Sent: 11/18/2019  9:53 PM EDT  To: Debbora Presto, NP   Thank you for your note. My preference would be for her to be on the least amount of medicine possible, unless she had a clearly depressed mood that was affecting her daily life. Does the patient/family think her mood is depressed to the point of needing medication?   Brigitte Pulse  ----- Message -----  From: Debbora Presto, NP  Sent: 11/18/2019  3:41 PM EDT  To: Tonia Ghent, MD

## 2019-11-24 NOTE — Progress Notes (Signed)
I reviewed note and agree with plan.   Penni Bombard, MD 21/10/4710, 5:27 PM Certified in Neurology, Neurophysiology and Neuroimaging  [facility] [address] [place], [place] [address] [phone]

## 2019-12-03 ENCOUNTER — Other Ambulatory Visit: Payer: Self-pay

## 2019-12-03 ENCOUNTER — Ambulatory Visit (INDEPENDENT_AMBULATORY_CARE_PROVIDER_SITE_OTHER): Payer: Medicare Other

## 2019-12-03 DIAGNOSIS — Z23 Encounter for immunization: Secondary | ICD-10-CM

## 2019-12-30 ENCOUNTER — Telehealth: Payer: Self-pay | Admitting: Family Medicine

## 2019-12-30 ENCOUNTER — Other Ambulatory Visit: Payer: Self-pay

## 2019-12-30 ENCOUNTER — Ambulatory Visit (INDEPENDENT_AMBULATORY_CARE_PROVIDER_SITE_OTHER): Payer: Medicare Other | Admitting: Internal Medicine

## 2019-12-30 VITALS — BP 128/78 | HR 80 | Temp 98.1°F | Wt 193.0 lb

## 2019-12-30 DIAGNOSIS — W19XXXA Unspecified fall, initial encounter: Secondary | ICD-10-CM | POA: Diagnosis not present

## 2019-12-30 DIAGNOSIS — R0781 Pleurodynia: Secondary | ICD-10-CM

## 2019-12-30 DIAGNOSIS — Y92009 Unspecified place in unspecified non-institutional (private) residence as the place of occurrence of the external cause: Secondary | ICD-10-CM | POA: Diagnosis not present

## 2019-12-30 DIAGNOSIS — S51811A Laceration without foreign body of right forearm, initial encounter: Secondary | ICD-10-CM

## 2019-12-30 NOTE — Telephone Encounter (Signed)
Pt husband called in due to his wife fell and on the right arm its  skin off between the wrist and elbow and wanted to be seen and he stated that he has asked her if anything hurts and she said her ribs on the right side is sore. And she has dementia.

## 2019-12-30 NOTE — Telephone Encounter (Signed)
Called Alice Smith and left him a message that appointment has been made for them to see Webb Silversmith, NP at 415 today.  They are to call back if cannot make or if patient triggers any COVID screening questions.

## 2020-01-03 ENCOUNTER — Encounter: Payer: Self-pay | Admitting: Internal Medicine

## 2020-01-03 NOTE — Progress Notes (Signed)
Subjective:    Patient ID: Alice Smith, female    DOB: 11-05-1945, 74 y.o.   MRN: [id number]  HPI  Patient presents the clinic today with complaint of skin tear to right forearm and right side rib pain after falling out of bed this morning.  She is accompanied by her husband who reports that neither one of them know how she fell out of bed.  She does not think that she hit her head or loss consciousness.  She describes the rib pain as sore and achy.  The pain does not radiate.  The pain is not worse with taking a deep breath.  They were able to clean the wound to the right forearm.  They have not noticed any drainage from the area.  She has not taken any medication OTC for this. Her last tetanus was 08/2014.  Review of Systems      Past Medical History:  Diagnosis Date  . Arthritis   . Breast mass 05/2001   benign  . Complication of anesthesia    slow to wake up x 1  . Concussion 2014, 05/2016  . Dry cough    at night  . Hemorrhoids   . Migraine    evaluated with Dr. Domingo Cocking 11/2009  . Osteoporosis     Current Outpatient Medications  Medication Sig Dispense Refill  . Bacillus Coagulans-Inulin (PROBIOTIC) 1-250 BILLION-MG CAPS Take by mouth daily.    . calcium-vitamin D (OSCAL WITH D) 500-200 MG-UNIT tablet Take 1 tablet by mouth daily.    . Cholecalciferol (DIALYVITE VITAMIN D 5000) 125 MCG (5000 UT) capsule Take 1 capsule (5,000 Units total) by mouth daily.    Marland Kitchen donepezil (ARICEPT) 10 MG tablet TAKE 1 TABLET BY MOUTH AT  BEDTIME 90 tablet 3  . famotidine (PEPCID) 20 MG tablet Take 1 tablet (20 mg total) by mouth 2 (two) times daily. 180 tablet 1  . loratadine (CLARITIN) 10 MG tablet Take 10 mg by mouth daily.    . memantine (NAMENDA) 10 MG tablet TAKE 1 TABLET BY MOUTH  TWICE DAILY 180 tablet 3   No current facility-administered medications for this visit.    Allergies  Allergen Reactions  . Clarithromycin     REACTION: nausea and vomiting  . Erythromycin  Ethylsuccinate     REACTION: GI upset  . Fluticasone-Salmeterol     REACTION: Sore tongue and rash  . Procaine Hcl     REACTION: swelling  . Sulfonamide Derivatives     REACTION: rash  . Topiramate     REACTION: confusion    Family History  Problem Relation Age of Onset  . Arthritis Mother   . Cancer Mother        lung cancer, dx'd in her 8s  . Cancer Father        throat  . COPD Brother   . Cancer Sister        breast  . Breast cancer Sister   . Colon cancer Neg Hx     Social History   Socioeconomic History  . Marital status: Married    Spouse name: Not on file  . Number of children: 3  . Years of education: Not on file  . Highest education level: Not on file  Occupational History  . Occupation: home  Tobacco Use  . Smoking status: Former Smoker    Packs/day: 1.00    Years: 5.00    Pack years: 5.00    Types: Cigarettes  Quit date: 06/17/1978    Years since quitting: 41.5  . Smokeless tobacco: Never Used  . Tobacco comment: 5 pack year history  Substance and Sexual Activity  . Alcohol use: No  . Drug use: No  . Sexual activity: Not on file  Other Topics Concern  . Not on file  Social History Narrative   Second marriage, 29. Has 3 adult children, adopted 4, 6 grandkids.   From [place], [place]   Education- GED, some college   Social Determinants of Health   Financial Resource Strain: [place]   . Difficulty of Paying Living Expenses: Not hard at all  Food Insecurity: No Food Insecurity  . Worried About Charity fundraiser in the Last Year: Never true  . Ran Out of Food in the Last Year: Never true  Transportation Needs: No Transportation Needs  . Lack of Transportation (Medical): No  . Lack of Transportation (Non-Medical): No  Physical Activity: Sufficiently Active  . Days of Exercise per Week: 3 days  . Minutes of Exercise per Session: 50 min  Stress: No Stress Concern Present  . Feeling of Stress : Not at all  Social Connections:   .  Frequency of Communication with Friends and Family: Not on file  . Frequency of Social Gatherings with Friends and Family: Not on file  . Attends Religious Services: Not on file  . Active Member of Clubs or Organizations: Not on file  . Attends Archivist Meetings: Not on file  . Marital Status: Not on file  Intimate Partner Violence: Not At Risk  . Fear of Current or Ex-Partner: No  . Emotionally Abused: No  . Physically Abused: No  . Sexually Abused: No     Constitutional: Denies fever, malaise, fatigue, headache or abrupt weight changes.  Respiratory: Denies difficulty breathing, shortness of breath, cough or sputum production.   Cardiovascular: Denies chest pain, chest tightness, palpitations or swelling in the hands or feet.  Musculoskeletal: Patient reports right side rib pain.  Denies decrease in range of motion, difficulty with gait, or joint pain and swelling.  Skin: Patient reports skin tear to right forearm.  Denies redness, rashes, lesions or ulcercations.    No other specific complaints in a complete review of systems (except as listed in HPI above).  Objective:   Physical Exam   BP 128/78   Pulse 80   Temp 98.1 F (36.7 C) (Temporal)   Wt 193 lb (87.5 kg)   SpO2 97%   BMI 35.30 kg/m  Wt Readings from Last 3 Encounters:  12/30/19 193 lb (87.5 kg)  11/18/19 197 lb (89.4 kg)  04/06/19 201 lb 1 oz (91.2 kg)    General: Appears her stated age, obese, in NAD. Skin: 3 cm x 1 cm skin tear to right medial forearm.  Bleeding controlled, no purulent drainage or surrounding redness noted. Cardiovascular: Normal rate and rhythm.  Pulmonary/Chest: Normal effort and positive vesicular breath sounds. No respiratory distress. No wheezes, rales or ronchi noted.  Abdomen: Soft and nontender.  Musculoskeletal: No significant pain with palpation of the right ribs.  No deformity noted. Neurological: Alert and oriented to person.  History of dementia.   BMET      Component Value Date/Time   NA 143 03/30/2019 1048   K 4.1 03/30/2019 1048   CL 107 03/30/2019 1048   CO2 27 03/30/2019 1048   GLUCOSE 114 (H) 03/30/2019 1048   BUN 18 03/30/2019 1048   CREATININE 0.78 03/30/2019 1048  CREATININE 0.81 07/25/2018 1527   CALCIUM 9.5 03/30/2019 1048   GFRNONAA 90 (L) 09/01/2013 0452   GFRAA >90 09/01/2013 0452    Lipid Panel     Component Value Date/Time   CHOL 220 (H) 03/30/2019 1048   TRIG 133.0 03/30/2019 1048   HDL 50.50 03/30/2019 1048   CHOLHDL 4 03/30/2019 1048   VLDL 26.6 03/30/2019 1048   LDLCALC 143 (H) 03/30/2019 1048   LDLCALC 148 (H) 07/25/2018 1527    CBC    Component Value Date/Time   WBC 6.4 03/30/2019 1048   RBC 4.60 03/30/2019 1048   HGB 14.0 03/30/2019 1048   HCT 41.8 03/30/2019 1048   PLT 285.0 03/30/2019 1048   MCV 91.0 03/30/2019 1048   MCH 29.4 09/01/2013 0452   MCHC 33.5 03/30/2019 1048   RDW 13.4 03/30/2019 1048   LYMPHSABS 2.2 03/30/2019 1048   MONOABS 0.4 03/30/2019 1048   EOSABS 0.2 03/30/2019 1048   BASOSABS 0.1 03/30/2019 1048    Hgb A1C No results found for: HGBA1C         Assessment & Plan:   Right Side Rib Pain, Skin Tear of Right Forearm status post Fall:  Skin tear cleansed and redressed, wound care instructions given Tetanus UTD No indication for x-ray of right side ribs at this time Encouraged use of heating pad Encourage deep breathing exercises  Return precautions discussed Webb Silversmith, NP This visit occurred during the SARS-CoV-2 public health emergency.  Safety protocols were in place, including screening questions prior to the visit, additional usage of staff PPE, and extensive cleaning of exam room while observing appropriate contact time as indicated for disinfecting solutions.

## 2020-01-03 NOTE — Patient Instructions (Signed)
Wound Care, Adult Taking care of your wound properly can help to prevent pain, infection, and scarring. It can also help your wound to heal more quickly. How to care for your wound Wound care      Follow instructions from your health care provider about how to take care of your wound. Make sure you: ? Wash your hands with soap and water before you change the bandage (dressing). If soap and water are not available, use hand sanitizer. ? Change your dressing as told by your health care provider. ? Leave stitches (sutures), skin glue, or adhesive strips in place. These skin closures may need to stay in place for 2 weeks or longer. If adhesive strip edges start to loosen and curl up, you may trim the loose edges. Do not remove adhesive strips completely unless your health care provider tells you to do that.  Check your wound area every day for signs of infection. Check for: ? Redness, swelling, or pain. ? Fluid or blood. ? Warmth. ? Pus or a bad smell.  Ask your health care provider if you should clean the wound with mild soap and water. Doing this may include: ? Using a clean towel to pat the wound dry after cleaning it. Do not rub or scrub the wound. ? Applying a cream or ointment. Do this only as told by your health care provider. ? Covering the incision with a clean dressing.  Ask your health care provider when you can leave the wound uncovered.  Keep the dressing dry until your health care provider says it can be removed. Do not take baths, swim, use a hot tub, or do anything that would put the wound underwater until your health care provider approves. Ask your health care provider if you can take showers. You may only be allowed to take sponge baths. Medicines   If you were prescribed an antibiotic medicine, cream, or ointment, take or use the antibiotic as told by your health care provider. Do not stop taking or using the antibiotic even if your condition improves.  Take  over-the-counter and prescription medicines only as told by your health care provider. If you were prescribed pain medicine, take it 30 or more minutes before you do any wound care or as told by your health care provider. General instructions  Return to your normal activities as told by your health care provider. Ask your health care provider what activities are safe.  Do not scratch or pick at the wound.  Do not use any products that contain nicotine or tobacco, such as cigarettes and e-cigarettes. These may delay wound healing. If you need help quitting, ask your health care provider.  Keep all follow-up visits as told by your health care provider. This is important.  Eat a diet that includes protein, vitamin A, vitamin C, and other nutrient-rich foods to help the wound heal. ? Foods rich in protein include meat, dairy, beans, nuts, and other sources. ? Foods rich in vitamin A include carrots and dark green, leafy vegetables. ? Foods rich in vitamin C include citrus, tomatoes, and other fruits and vegetables. ? Nutrient-rich foods have protein, carbohydrates, fat, vitamins, or minerals. Eat a variety of healthy foods including vegetables, fruits, and whole grains. Contact a health care provider if:  You received a tetanus shot and you have swelling, severe pain, redness, or bleeding at the injection site.  Your pain is not controlled with medicine.  You have redness, swelling, or pain around the wound.    You have fluid or blood coming from the wound.  Your wound feels warm to the touch.  You have pus or a bad smell coming from the wound.  You have a fever or chills.  You are nauseous or you vomit.  You are dizzy. Get help right away if:  You have a red streak going away from your wound.  The edges of the wound open up and separate.  Your wound is bleeding, and the bleeding does not stop with gentle pressure.  You have a rash.  You faint.  You have trouble  breathing. Summary  Always wash your hands with soap and water before changing your bandage (dressing).  To help with healing, eat foods that are rich in protein, vitamin A, vitamin C, and other nutrients.  Check your wound every day for signs of infection. Contact your health care provider if you suspect that your wound is infected. This information is not intended to replace advice given to you by your health care provider. Make sure you discuss any questions you have with your health care provider. Document Revised: 05/26/2018 Document Reviewed: 08/23/2015 Elsevier Patient Education  2020 Elsevier Inc.  

## 2020-01-06 ENCOUNTER — Ambulatory Visit: Payer: Medicare Other | Attending: Internal Medicine

## 2020-01-06 DIAGNOSIS — Z23 Encounter for immunization: Secondary | ICD-10-CM

## 2020-01-06 NOTE — Progress Notes (Signed)
   Covid-19 Vaccination Clinic  Name:  Alice Smith    MRN: [id number] DOB: 1945-04-03  01/06/2020  [name] was observed post Covid-19 immunization for 15 minutes without incident. She was provided with Vaccine Information Sheet and instruction to access the V-Safe system.   [name] was instructed to call 911 with any severe reactions post vaccine: Marland Kitchen Difficulty breathing  . Swelling of face and throat  . A fast heartbeat  . A bad rash all over body  . Dizziness and weakness   Immunizations Administered    Name Date Dose VIS Date Route   Pfizer COVID-19 Vaccine 01/06/2020  9:56 AM 0.3 mL 12/09/2019 Intramuscular   Manufacturer: [place]   Lot: [id number]   NDC: [address]-1

## 2020-01-08 ENCOUNTER — Other Ambulatory Visit (HOSPITAL_COMMUNITY): Payer: Self-pay | Admitting: Internal Medicine

## 2020-01-25 ENCOUNTER — Encounter: Payer: Self-pay | Admitting: Family Medicine

## 2020-01-25 ENCOUNTER — Telehealth: Payer: Self-pay

## 2020-01-25 ENCOUNTER — Telehealth (INDEPENDENT_AMBULATORY_CARE_PROVIDER_SITE_OTHER): Payer: Medicare Other | Admitting: Family Medicine

## 2020-01-25 VITALS — Temp 98.5°F | Wt 183.0 lb

## 2020-01-25 DIAGNOSIS — J302 Other seasonal allergic rhinitis: Secondary | ICD-10-CM | POA: Diagnosis not present

## 2020-01-25 DIAGNOSIS — R6889 Other general symptoms and signs: Secondary | ICD-10-CM

## 2020-01-25 DIAGNOSIS — R059 Cough, unspecified: Secondary | ICD-10-CM

## 2020-01-25 DIAGNOSIS — F0390 Unspecified dementia without behavioral disturbance: Secondary | ICD-10-CM

## 2020-01-25 DIAGNOSIS — F03B0 Unspecified dementia, moderate, without behavioral disturbance, psychotic disturbance, mood disturbance, and anxiety: Secondary | ICD-10-CM

## 2020-01-25 DIAGNOSIS — R0989 Other specified symptoms and signs involving the circulatory and respiratory systems: Secondary | ICD-10-CM

## 2020-01-25 MED ORDER — AZELASTINE HCL 0.1 % NA SOLN: 2.0000 | Freq: Every day | NASAL | 12 refills | Status: DC | PRN

## 2020-01-25 NOTE — Telephone Encounter (Signed)
pts husband calls (DPR signed) for 6 months pt has had some dry cough at night but over the past weekend the dry cough has worsened and pt had to get up several times last night with coughing; no fever, CP,SB or wheezing;pt had home covid test on 01/24/20 that was negative. Pt's husband said pt has some memory issues and Mr Galla does not think pt needs face to face visit and scheduled video appt with Glenda Chroman FNP today at 3pm. UC & ED precautions given and Mr Batley voiced understanding.

## 2020-01-25 NOTE — Telephone Encounter (Signed)
Noted  

## 2020-01-25 NOTE — Progress Notes (Signed)
Virtual Visit via Video Note  I connected with Alice Smith on 01/25/20 at  3:00 PM EST by a video enabled telemedicine application and verified that I am speaking with the correct person using two identifiers.  Location: Patient: In her home Provider: [place] Persons participating in virtual visit: Patient, provider, patient's husband   I discussed the limitations of evaluation and management by telemedicine and the availability of in person appointments. The patient expressed understanding and agreed to proceed.  History of Present Illness: Chief Complaint  Patient presents with  . Cough   This is a 74 year old female who presents today for virtual visit for 1 to 43-month history of cough. She  has a past medical history of Arthritis, Breast mass (09/7562), Complication of anesthesia, Concussion (2014, 05/2016), Dry cough, Hemorrhoids, Migraine, and Osteoporosis.  She has a history of moderate dementia and her husband provides most of the history. Husband reports a 1 to 19-month history of dry cough which has increased over the last 2 weeks.  This is accompanied by a light of throat clearing and swallowing.  Cough drops have been used with improvement.  Cough was worse last night.  Patient denies any drainage down the back of her throat and per her husband, she has not been blowing her nose recently.  There has been no fever.  The patient denies heartburn, nausea, dysphagia, odynophagia, shortness of breath, wheeze.  She has not had any coughing or choking with eating.  Her appetite has been normal and she is sleeping well.  They walk 1 to 3 miles a day and she has been losing some weight with increased exercise.  She has been working on improving her water intake recently.  She is fully vaccinated against COVID-19 including up-to-date booster.  She has had a recent negative Covid test.    Observations/Objective: Patient is alert and is in no acute distress.  Visible skin is  unremarkable.  She answers some questions with short sentences.  I do hear her frequently clear her throat and witnessed several episodes of nonproductive cough.  Her respirations are even and unlabored. Temp 98.5 F (36.9 C)  Wt Readings from Last 3 Encounters:  01/25/20 183 lb (83 kg)  12/30/19 193 lb (87.5 kg)  11/18/19 197 lb (89.4 kg)   BP Readings from Last 3 Encounters:  12/30/19 128/78  11/18/19 133/69  04/06/19 122/76     Assessment and Plan: 1. Seasonal allergies -She has a history of seasonal allergies.  Reported reaction to fluticasone-salmeterol so will not prescribe fluticasone but will instead give her a trial of azelastine nasal spray - azelastine (ASTELIN) 0.1 % nasal spray; Place 2 sprays into both nostrils daily as needed for rhinitis. Use in each nostril as directed  Dispense: 30 mL; Refill: 12  2. Throat clearing -She denies symptoms of reflux but this should be considered if no improvement with treatment of seasonal allergies -Discussed cyclical nature of throat clearing and advised avoidance, use of throat lozenges, frequent liquids, distraction if helpful  3. Cough -I suspect it has more of a relationship to her postnasal drainage, throat clearing but also could be cyclical cough on its own -Discussed using Mucinex DM as needed as well as recommendations in #1, #2 - azelastine (ASTELIN) 0.1 % nasal spray; Place 2 sprays into both nostrils daily as needed for rhinitis. Use in each nostril as directed  Dispense: 30 mL; Refill: 12 -If no improvement in 1 to 2 weeks, will need to come  into the office for hands-on physical exam, possible chest x-ray -Follow-up precautions reviewed-fever, shortness of breath, wheeze, sputum production  4.  Moderate dementia without behavioral disturbance -Patient's husband is ensuring that she stays active, it sounds as though she is sleeping well and has good p.o. intake -Encouraged to their efforts   Clarene Reamer,  FNP-BC  [facility], [place]  01/25/2020 3:32 PM   Follow Up Instructions:    I discussed the assessment and treatment plan with the patient. The patient was provided an opportunity to ask questions and all were answered. The patient agreed with the plan and demonstrated an understanding of the instructions.   The patient was advised to call back or seek an in-person evaluation if the symptoms worsen or if the condition fails to improve as anticipated.  Wilfred Lacy, FNP

## 2020-03-08 ENCOUNTER — Encounter: Payer: Medicare Other | Admitting: Dermatology

## 2020-03-20 ENCOUNTER — Other Ambulatory Visit: Payer: Self-pay | Admitting: Family Medicine

## 2020-03-20 DIAGNOSIS — E785 Hyperlipidemia, unspecified: Secondary | ICD-10-CM

## 2020-03-20 DIAGNOSIS — R7989 Other specified abnormal findings of blood chemistry: Secondary | ICD-10-CM

## 2020-03-20 DIAGNOSIS — E559 Vitamin D deficiency, unspecified: Secondary | ICD-10-CM

## 2020-03-20 DIAGNOSIS — M810 Age-related osteoporosis without current pathological fracture: Secondary | ICD-10-CM

## 2020-03-22 ENCOUNTER — Ambulatory Visit: Payer: Medicare Other | Admitting: Dermatology

## 2020-03-22 ENCOUNTER — Other Ambulatory Visit: Payer: Self-pay

## 2020-03-22 DIAGNOSIS — L249 Irritant contact dermatitis, unspecified cause: Secondary | ICD-10-CM | POA: Diagnosis not present

## 2020-03-22 DIAGNOSIS — L820 Inflamed seborrheic keratosis: Secondary | ICD-10-CM | POA: Diagnosis not present

## 2020-03-22 DIAGNOSIS — L720 Epidermal cyst: Secondary | ICD-10-CM | POA: Diagnosis not present

## 2020-03-22 DIAGNOSIS — L578 Other skin changes due to chronic exposure to nonionizing radiation: Secondary | ICD-10-CM

## 2020-03-22 DIAGNOSIS — D225 Melanocytic nevi of trunk: Secondary | ICD-10-CM | POA: Diagnosis not present

## 2020-03-22 DIAGNOSIS — L853 Xerosis cutis: Secondary | ICD-10-CM

## 2020-03-22 DIAGNOSIS — L814 Other melanin hyperpigmentation: Secondary | ICD-10-CM

## 2020-03-22 DIAGNOSIS — L821 Other seborrheic keratosis: Secondary | ICD-10-CM

## 2020-03-22 DIAGNOSIS — Z1283 Encounter for screening for malignant neoplasm of skin: Secondary | ICD-10-CM

## 2020-03-22 DIAGNOSIS — D229 Melanocytic nevi, unspecified: Secondary | ICD-10-CM | POA: Diagnosis not present

## 2020-03-22 DIAGNOSIS — D1800 Hemangioma unspecified site: Secondary | ICD-10-CM

## 2020-03-22 NOTE — Progress Notes (Signed)
Follow-Up Visit   Subjective  Alice Smith is a 75 y.o. female who presents for the following: Total body skin exam (1 yr f/u), growths (Breast, daughter not sur how long they have been there), and rash (Buttocks, from wet depends).  Better today. Daughter put vaseline on it.  Patient accompanied by husband and daughter who contribute to history.  The following portions of the chart were reviewed this encounter and updated as appropriate:       Review of Systems:  No other skin or systemic complaints except as noted in HPI or Assessment and Plan.  Objective  Well appearing patient in no apparent distress; mood and affect are within normal limits.  A full examination was performed including scalp, head, eyes, ears, nose, lips, neck, chest, axillae, abdomen, back, buttocks, bilateral upper extremities, bilateral lower extremities, hands, feet, fingers, toes, fingernails, and toenails. All findings within normal limits unless otherwise noted below.  Objective  Spinal lower back: Firm papule with black punctum (blackhead)  Left lateral lower back  Objective  Left lateral lower back: 6.71mm med brown macule  Objective  chest x 2 (2): Erythematous keratotic or waxy stuck-on papule  Objective  Gluteal Crease: Clear today,   Objective  forehead: White paps   Assessment & Plan   .Skin cancer screening performed today.  Epidermal cyst Spinal lower back  Benign, observe  Nevus Left lateral lower back  Benign-appearing.  Observation.  Call clinic for new or changing moles.  Recommend daily use of broad spectrum spf 30+ sunscreen to sun-exposed areas.    Inflamed seborrheic keratosis (2) chest x 2  Destruction of lesion - chest x 2  Destruction method: cryotherapy   Informed consent: discussed and consent obtained   Lesion destroyed using liquid nitrogen: Yes   Region frozen until ice ball extended beyond lesion: Yes   Outcome: patient tolerated procedure well  with no complications   Post-procedure details: wound care instructions given    Irritant dermatitis Gluteal Crease  2ndary to Incontinence, clear today  Recommend daily Desitin or Vaseline as a barrier protectant from wet Depends  Milia forehead  Benign, observe, not bothersome to patient   Seborrheic Keratoses - Stuck-on, waxy, tan-brown papules and plaques  - Discussed benign etiology and prognosis. - Observe - Call for any changes  Lentigines - Scattered tan macules - Discussed due to sun exposure - Benign, observe - Recommend daily broad spectrum sunscreen SPF 30+ to sun-exposed areas, reapply every 2 hours as needed. - Call for any changes  Hemangiomas - Red papules - Discussed benign nature - Observe - Call for any changes  Melanocytic Nevi - Tan-brown and/or pink-flesh-colored symmetric macules and papules - Benign appearing on exam today - Observation - Call clinic for new or changing moles - Recommend daily use of broad spectrum spf 30+ sunscreen to sun-exposed areas.   Actinic Damage - chronic, secondary to cumulative UV radiation exposure/sun exposure over time - diffuse scaly erythematous macules with underlying dyspigmentation - Recommend daily broad spectrum sunscreen SPF 30+ to sun-exposed areas, reapply every 2 hours as needed.  - Call for new or changing lesions. - Chest  Xerosis - diffuse xerotic patches - recommend gentle, hydrating skin care - gentle skin care handout given - Recommend Cerave cream  Return in about 1 year (around 03/22/2021) for TBSE.  I, Othelia Pulling, RMA, am acting as scribe for Brendolyn Patty, MD . Documentation: I have reviewed the above documentation for accuracy and completeness, and I agree  with the above.  Brendolyn Patty MD

## 2020-03-22 NOTE — Patient Instructions (Signed)

## 2020-03-30 ENCOUNTER — Telehealth: Payer: Self-pay

## 2020-03-30 ENCOUNTER — Other Ambulatory Visit (INDEPENDENT_AMBULATORY_CARE_PROVIDER_SITE_OTHER): Payer: Medicare Other

## 2020-03-30 ENCOUNTER — Other Ambulatory Visit: Payer: Self-pay

## 2020-03-30 DIAGNOSIS — M810 Age-related osteoporosis without current pathological fracture: Secondary | ICD-10-CM

## 2020-03-30 DIAGNOSIS — R7989 Other specified abnormal findings of blood chemistry: Secondary | ICD-10-CM

## 2020-03-30 DIAGNOSIS — E785 Hyperlipidemia, unspecified: Secondary | ICD-10-CM | POA: Diagnosis not present

## 2020-03-30 DIAGNOSIS — F0390 Unspecified dementia without behavioral disturbance: Secondary | ICD-10-CM | POA: Diagnosis not present

## 2020-03-30 DIAGNOSIS — E559 Vitamin D deficiency, unspecified: Secondary | ICD-10-CM

## 2020-03-30 LAB — COMPREHENSIVE METABOLIC PANEL
ALT: 12 U/L (ref 0–35)
AST: 14 U/L (ref 0–37)
Albumin: 4 g/dL (ref 3.5–5.2)
Alkaline Phosphatase: 86 U/L (ref 39–117)
BUN: 22 mg/dL (ref 6–23)
CO2: 29 mEq/L (ref 19–32)
Calcium: 9.7 mg/dL (ref 8.4–10.5)
Chloride: 107 mEq/L (ref 96–112)
Creatinine, Ser: 0.87 mg/dL (ref 0.40–1.20)
GFR: 65.42 mL/min (ref >60.00)
Glucose, Bld: 126 mg/dL — ABNORMAL HIGH (ref 70–99)
Potassium: 4 mEq/L (ref 3.5–5.1)
Sodium: 144 mEq/L (ref 135–145)
Total Bilirubin: 0.6 mg/dL (ref 0.2–1.2)
Total Protein: 6.7 g/dL (ref 6.0–8.3)

## 2020-03-30 LAB — CBC WITH DIFFERENTIAL/PLATELET
Basophils Absolute: 0.1 10*3/uL (ref 0.0–0.1)
Basophils Relative: 0.9 % (ref 0.0–3.0)
Eosinophils Absolute: 0.3 10*3/uL (ref 0.0–0.7)
Eosinophils Relative: 5 % (ref 0.0–5.0)
HCT: 42.6 % (ref 36.0–46.0)
Hemoglobin: 14.5 g/dL (ref 12.0–15.0)
Lymphocytes Relative: 36.2 % (ref 12.0–46.0)
Lymphs Abs: 2.1 10*3/uL (ref 0.7–4.0)
MCHC: 34 g/dL (ref 30.0–36.0)
MCV: 91 fl (ref 78.0–100.0)
Monocytes Absolute: 0.5 10*3/uL (ref 0.1–1.0)
Monocytes Relative: 8.4 % (ref 3.0–12.0)
Neutro Abs: 2.9 10*3/uL (ref 1.4–7.7)
Neutrophils Relative %: 49.5 % (ref 43.0–77.0)
Platelets: 232 10*3/uL (ref 150.0–400.0)
RBC: 4.68 Mil/uL (ref 3.87–5.11)
RDW: 13.5 % (ref 11.5–15.5)
WBC: 5.8 10*3/uL (ref 4.0–10.5)

## 2020-03-30 LAB — LIPID PANEL
Cholesterol: 217 mg/dL — ABNORMAL HIGH (ref 0–200)
HDL: 54.4 mg/dL (ref >39.00)
LDL Cholesterol: 136 mg/dL — ABNORMAL HIGH (ref 0–99)
NonHDL: 162.34
Total CHOL/HDL Ratio: 4
Triglycerides: 134 mg/dL (ref 0.0–149.0)
VLDL: 26.8 mg/dL (ref 0.0–40.0)

## 2020-03-30 LAB — VITAMIN B12: Vitamin B-12: 173 pg/mL — ABNORMAL LOW (ref 211–911)

## 2020-03-30 LAB — VITAMIN D 25 HYDROXY (VIT D DEFICIENCY, FRACTURES): VITD: 38.07 ng/mL (ref 30.00–100.00)

## 2020-03-30 LAB — TSH: TSH: 7.07 u[IU]/mL — ABNORMAL HIGH (ref 0.35–4.50)

## 2020-03-30 NOTE — Telephone Encounter (Signed)
Request to add has been sent

## 2020-03-30 NOTE — Telephone Encounter (Signed)
Alice Smith pts daughter (DPR signed) left v/m that pt was seen earlier today in [facility] lab dept and Alice Smith wants to make sure that a Vit B 12 level was checked and if not can that be added on. Unable to reach [place] by phone to ask if particular reason wanting the B 12 test done. Pt has appt to see Dr Damita Dunnings on 04/07/20 for CPX and Alice Smith plans to be at that appt with pt due to pt having dementia. Looking at labs ordered; CMP,CBC with diff, lipid, vit D and TSH. [place] request cb; sending note to Dr Damita Dunnings. Per lab tab last Vit B 12 done 01/22/2017.

## 2020-03-30 NOTE — Telephone Encounter (Signed)
Patient's daughter left a voicemail stating that she may have missed a call from the office. [name] stated that she would like a call back to let her know if the B12 level can be added to her lab work done.

## 2020-03-30 NOTE — Telephone Encounter (Signed)
Can you add a B12 level?  Diagnosis is dementia, F03.90. Let me know if I need to put in a separate order.  Thanks.

## 2020-04-06 ENCOUNTER — Other Ambulatory Visit: Payer: Self-pay

## 2020-04-07 ENCOUNTER — Encounter: Payer: Self-pay | Admitting: Family Medicine

## 2020-04-07 ENCOUNTER — Ambulatory Visit (INDEPENDENT_AMBULATORY_CARE_PROVIDER_SITE_OTHER): Payer: Medicare Other | Admitting: Family Medicine

## 2020-04-07 VITALS — BP 112/66 | HR 70 | Temp 97.4°F | Ht 62.0 in | Wt 191.0 lb

## 2020-04-07 DIAGNOSIS — Z7189 Other specified counseling: Secondary | ICD-10-CM

## 2020-04-07 DIAGNOSIS — Z0000 Encounter for general adult medical examination without abnormal findings: Secondary | ICD-10-CM | POA: Diagnosis not present

## 2020-04-07 DIAGNOSIS — E538 Deficiency of other specified B group vitamins: Secondary | ICD-10-CM | POA: Diagnosis not present

## 2020-04-07 DIAGNOSIS — F0390 Unspecified dementia without behavioral disturbance: Secondary | ICD-10-CM | POA: Diagnosis not present

## 2020-04-07 DIAGNOSIS — R7989 Other specified abnormal findings of blood chemistry: Secondary | ICD-10-CM | POA: Diagnosis not present

## 2020-04-07 DIAGNOSIS — F03B0 Unspecified dementia, moderate, without behavioral disturbance, psychotic disturbance, mood disturbance, and anxiety: Secondary | ICD-10-CM

## 2020-04-07 MED ORDER — POLYETHYLENE GLYCOL 3350 17 GM/SCOOP PO POWD: 8.5000 g | Freq: Every day | ORAL | Status: DC | PRN

## 2020-04-07 MED ORDER — CYANOCOBALAMIN 1000 MCG/ML IJ SOLN
1000.0000 ug | Freq: Once | INTRAMUSCULAR | Status: AC
  Administered 2020-04-07: 1000 ug via INTRAMUSCULAR

## 2020-04-07 NOTE — Patient Instructions (Addendum)
B12 shot today.    Ask for a nurse visit in 1 month (repeated montly) for B12 shot.   Mid May- at the 4th shot, get labs prior to the injection.    We'll go from there.  Take care.  Glad to see you.

## 2020-04-07 NOTE — Progress Notes (Signed)
This visit occurred during the SARS-CoV-2 public health emergency.  Safety protocols were in place, including screening questions prior to the visit, additional usage of staff PPE, and extensive cleaning of exam room while observing appropriate contact time as indicated for disinfecting solutions.  I have personally reviewed the Medicare Annual Wellness questionnaire and have noted 1. The patient's medical and social history 2. Their use of alcohol, tobacco or illicit drugs 3. Their current medications and supplements 4. The patient's functional ability including ADL's, fall risks, home safety risks and hearing or visual             impairment. 5. Diet and physical activities 6. Evidence for depression or mood disorders  The patients weight, height, BMI have been recorded in the chart and visual acuity is per eye clinic.  I have made referrals, counseling and provided education to the patient based review of the above and I have provided the pt with a written personalized care plan for preventive services.  Provider list updated- see scanned forms.  Routine anticipatory guidance given to patient.  See health maintenance. The possibility exists that previously documented standard health maintenance information may have been brought forward from a previous encounter into this note.  If needed, that same information has been updated to reflect the current situation based on today's encounter.    Flu up-to-date Shingles to be done at pharmacy PNA up-to-date Tetanus 2016 COVID vaccine previously done. Colon cancer screening deferred at this point given concern for memory change/anesthesia. Breast cancer screening declined by patient. Bone density test 2020, reasonable to defer at this point.  Discussed. Advance directive- Husband or daughter [name] designated if patient were incapacitated.  Cognitive function addressed- see scanned forms- and if abnormal then additional documentation follows.    History of abnormal TSH.  See plan.  Discussed.  Memory change.  She is less conversant.  Discussed toileting.  Constipation noted.  She doesn't drink water w/o prompting.  She is less aware of BMs, with subsequent incontinence.  We talked about trying to get her to the bathroom on a scheduled basis.  She needs prompting for activity during the day.  She still recognizes people but has a flatter affect.  She is not agitated.  Family has noted progressive changes.  B12 deficiency.  Not vegetarian.  B12 previously normal on multiple checks.  Lower now.  Discussed treating in case this had any effect on her memory.  Even if this did not affect her memory in any way would still be reasonable to treat to try to prevent other problems.  Discussed.  Benign skin lesions noted.  Skin tag and open comedone on back.  dw pt and daughter.    PMH and SH reviewed  Meds, vitals, and allergies reviewed.   ROS: Per HPI.  Unless specifically indicated otherwise in HPI, the patient denies:  General: fever. Eyes: acute vision changes ENT: sore throat Cardiovascular: chest pain Respiratory: SOB GI: vomiting GU: dysuria Musculoskeletal: acute back pain Derm: acute rash Neuro: acute motor dysfunction Psych: worsening mood Endocrine: polydipsia Heme: bleeding Allergy: hayfever  GEN: nad, alert and oriented to self but not time.  0/3 on recall testing. HEENT: ncat NECK: supple w/o LA CV: rrr. PULM: ctab, no inc wob ABD: soft, +bs EXT: no edema SKIN: no acute rash

## 2020-04-10 DIAGNOSIS — E538 Deficiency of other specified B group vitamins: Secondary | ICD-10-CM | POA: Insufficient documentation

## 2020-04-10 MED ORDER — CYANOCOBALAMIN 1000 MCG/ML IJ SOLN: 1000.0000 ug | INTRAMUSCULAR | Status: DC

## 2020-04-10 NOTE — Assessment & Plan Note (Addendum)
Would begin monthly IM B12 replacement In the middle of May- at the 4th shot, get labs prior to the injection.

## 2020-04-10 NOTE — Assessment & Plan Note (Signed)
Flu up-to-date Shingles to be done at pharmacy PNA up-to-date Tetanus 2016 COVID vaccine previously done. Colon cancer screening deferred at this point given concern for memory change/anesthesia. Breast cancer screening declined by patient. Bone density test 2020, reasonable to defer at this point.  Discussed. Advance directive- Husband or daughter [name] designated if patient were incapacitated.  Cognitive function addressed- see scanned forms- and if abnormal then additional documentation follows.

## 2020-04-10 NOTE — Assessment & Plan Note (Signed)
Advance directive- Husband or daughter [name] designated if patient were incapacitated.

## 2020-04-10 NOTE — Assessment & Plan Note (Signed)
We can recheck TSH with next B12 level.  Discussed.

## 2020-04-10 NOTE — Assessment & Plan Note (Signed)
See above regarding prompting especially for toileting.  Would continue Aricept and Namenda.  See B12 treatment discussion above.  Recheck periodically.  Family update me as needed.

## 2020-05-11 ENCOUNTER — Ambulatory Visit (INDEPENDENT_AMBULATORY_CARE_PROVIDER_SITE_OTHER): Payer: Medicare Other

## 2020-05-11 ENCOUNTER — Other Ambulatory Visit: Payer: Self-pay

## 2020-05-11 DIAGNOSIS — E538 Deficiency of other specified B group vitamins: Secondary | ICD-10-CM | POA: Diagnosis not present

## 2020-05-11 MED ORDER — CYANOCOBALAMIN 1000 MCG/ML IJ SOLN
1000.0000 ug | Freq: Once | INTRAMUSCULAR | Status: AC
  Administered 2020-05-11: 1000 ug via INTRAMUSCULAR

## 2020-05-11 NOTE — Progress Notes (Signed)
Per orders of Dr. Damita Dunnings c/o Dr Danise Mina, injection of B12 given Left deltoid by Lurlean Nanny.  Patient tolerated injection well.

## 2020-05-17 ENCOUNTER — Encounter: Payer: Self-pay | Admitting: Family Medicine

## 2020-05-17 ENCOUNTER — Ambulatory Visit: Payer: Medicare Other | Admitting: Family Medicine

## 2020-05-17 DIAGNOSIS — F0390 Unspecified dementia without behavioral disturbance: Secondary | ICD-10-CM | POA: Diagnosis not present

## 2020-05-17 DIAGNOSIS — F03B0 Unspecified dementia, moderate, without behavioral disturbance, psychotic disturbance, mood disturbance, and anxiety: Secondary | ICD-10-CM

## 2020-05-17 MED ORDER — MEMANTINE HCL 10 MG PO TABS: 10.0000 mg | ORAL_TABLET | Freq: Two times a day (BID) | ORAL | 3 refills | Status: DC

## 2020-05-17 MED ORDER — DONEPEZIL HCL 10 MG PO TABS: 10.0000 mg | ORAL_TABLET | Freq: Every day | ORAL | 3 refills | Status: DC

## 2020-05-17 NOTE — Patient Instructions (Signed)
Below is our plan:  We will continue Aricept 10mg  daily and Namenda 10mg  BID. Continue B12 supplements with PCP.   Please make sure you are staying well hydrated. I recommend 50-60 ounces daily. Well balanced diet and regular exercise encouraged. Consistent sleep schedule with 6-8 hours recommended.   Please continue follow up with care team as directed.   Follow up with me in 6-12 months   You may receive a survey regarding today's visit. I encourage you to leave honest feed back as I do use this information to improve patient care. Thank you for seeing me today!     Dementia Caregiver Guide Dementia is a term used to describe a number of symptoms that affect memory and thinking. The most common symptoms include:  Memory loss.  Trouble with language and communication.  Trouble concentrating.  Poor judgment and problems with reasoning.  Wandering from home or public places.  Extreme anxiety or depression.  Being suspicious or having angry outbursts and accusations.  Child-like behavior and language. Dementia can be frightening and confusing. And taking care of someone with dementia can be challenging. This guide provides tips to help you when providing care for a person with dementia. How to help manage lifestyle changes Dementia usually gets worse slowly over time. In the early stages, people with dementia can stay independent and safe with some help. In later stages, they need help with daily tasks such as dressing, grooming, and using the bathroom. There are actions you can take to help a person manage his or her life while living with this condition. Communicating  When the person is talking or seems frustrated, make eye contact and hold the person's hand.  Ask specific questions that need yes or no answers.  Use simple words, short sentences, and a calm voice. Only give one direction at a time.  When offering choices, limit the person to just one or two.  Avoid  correcting the person in a negative way.  If the person is struggling to find the right words, gently try to help him or her. Preventing injury  Keep floors clear of clutter. Remove rugs, magazine racks, and floor lamps.  Keep hallways well lit, especially at night.  Put a handrail and nonslip mat in the bathtub or shower.  Put childproof locks on cabinets that contain dangerous items, such as medicines, alcohol, guns, toxic cleaning items, sharp tools or utensils, matches, and lighters.  For doors to the outside of the house, put the locks in places where the person cannot see or reach them easily. This will help ensure that the person does not wander out of the house and get lost.  Be prepared for emergencies. Keep a list of emergency phone numbers and addresses in a convenient area.  Remove car keys and lock garage doors so that the person does not try to get in the car and drive.  Have the person wear a bracelet that tracks locations and identifies the person as having memory problems. This should be worn at all times for safety.   Helping with daily life  Keep the person on track with his or her routine.  Try to identify areas where the person may need help.  Be supportive, patient, calm, and encouraging.  Gently remind the person that adjusting to changes takes time.  Help with the tasks that the person has asked for help with.  Keep the person involved in daily tasks and decisions as much as possible.  Encourage conversation,  but try not to get frustrated if the person struggles to find words or does not seem to appreciate your help.   How to recognize stress Look for signs of stress in yourself and in the person you are caring for. If you notice signs of stress, take steps to manage it. Symptoms of stress include:  Feeling anxious, irritable, frustrated, or angry.  Denying that the person has dementia or that his or her symptoms will not improve.  Feeling depressed,  hopeless, or unappreciated.  Difficulty sleeping.  Difficulty concentrating.  Developing stress-related health problems.  Feeling like you have too little time for your own life. Follow these instructions at home: Take care of your health Make sure that you and the person you are caring for:  Get regular sleep.  Exercise regularly.  Eat regular, nutritious meals.  Take over-the-counter and prescription medicines only as told by your health care providers.  Drink enough fluid to keep your urine pale yellow.  Attend all scheduled health care appointments.   General instructions  Join a support group with others who are caregivers.  Ask about respite care resources. Respite care can provide short-term care for the person so that you can have a regular break from the stress of caregiving.  Consider any safety risks and take steps to avoid them.  Organize medicines in a pill box for each day of the week.  Create a plan to handle any legal or financial matters. Get legal or financial advice if needed.  Keep a calendar in a central location to remind the person of appointments or other activities. Where to find support: Many individuals and organizations offer support. These include:  Support groups for people with dementia.  Support groups for caregivers.  Counselors or therapists.  Home health care services.  Adult day care centers. Where to find more information  Centers for Disease Control and Prevention: http://www.wolf.info/  Alzheimer's Association: CapitalMile.co.nz  Family Caregiver Alliance: www.caregiver.[place]: www.alzfdn.org Contact a health care provider if:  The person's health is rapidly getting worse.  You are no longer able to care for the person.  Caring for the person is affecting your physical and emotional health.  You are feeling depressed or anxious about caring for the person. Get help right away if:  The person  threatens himself or herself, you, or anyone else.  You feel depressed or sad, or feel that you want to harm yourself. If you ever feel like your loved one may hurt himself or herself or others, or if he or she shares thoughts about taking his or her own life, get help right away. You can go to your nearest emergency department or:  Call your local emergency services (911 in the [place]  Call a suicide crisis helpline, such as the [facility] at (226) 624-7702. This is open 24 hours a day in the [place]  Text the Crisis Text Line at (207)850-9039 (in the [place].). Summary  Dementia is a term used to describe a number of symptoms that affect memory and thinking.  Dementia usually gets worse slowly over time.  Take steps to reduce the person's risk of injury and to plan for future care.  Caregivers need support, relief from caregiving, and time for their own lives. This information is not intended to replace advice given to you by your health care provider. Make sure you discuss any questions you have with your health care provider. Document Revised: 06/22/2019 Document Reviewed: 06/22/2019 Elsevier  Elsevier Patient Education  2021 Elsevier Inc.   

## 2020-05-17 NOTE — Progress Notes (Signed)
PATIENT: Alice Smith DOB: [birth year]  REASON FOR VISIT: follow up HISTORY FROM: patient  Chief Complaint  Patient presents with  . Follow-up    RM 1 with daughter [name])  Pt is well, daughter states she is about the same. Very forgetful on daily task and what she is doing but remembers who people are overall.      HISTORY OF PRESENT ILLNESS: 05/17/20 ALL:  Tayia returns for follow up for dementia. She continues Aricept and Namenda. She presents with [name], her daughter, who aids in history. Memory waxes and wanes. Seems happy but mostly confused. She does not complain of any pain unless an area (knees, toes) are manipulated. She needs to be reminded to try to use the restroom. Occasionally has accidents. She will not use public restroom. No UTIs. Family reminds her to drink fluids. Miralax is helping with constipation. She showers independently. Some assistance with getting dressed. She will eat if food is provided but she rarely initiates meals. She will pick up a snack from the counter. She continues to walk 1-2 miles daily. No difficulty with gait. No falls. Has 7 children. Husband and children provide 24/7 supervision. Youngest son (of 7 children) still lives at home.    11/18/2019 ALL:  Alice Smith is a 75 y.o. female here today for follow up for dementia. She continues Aricept and Namenda. She is tolerating medications well. Mr Retana is helping to dose medications. She continues ADL's independently. She is eating and drinking normally. She has had several episode of bowel incontinence. She is being treated for constipation. This has helped. She is not comfortable using public restrooms. Memory waxes and wanes. She continues to have lack of interest. She has a flat affect. No changes in movement. She is walking 2 miles a day. She continues to go to church.   HISTORY: (copied from my note on 02/23/2019)  Alice Smith is a 75 y.o. female here today for follow up for  dementia. She continues Aricept 10mg  daily and Namenda 10mg  BID. She is tolerating medications well. Her husband is with her today and aids in history. He reports that memory is fairly stable. They have noticed some worsening with concentration. She is wanting to sit still more than usual. She continues to walk about 1 mile several times a week. She has had more urine and bowel incontinence. She has had two accidents over the past 6 months. She is wearing pads. She is eating well. She does seem to enjoy simple sugars. She is able to perform ADL's. She does not cook. She does not drive. No falls.   HISTORY: (copied from my note on 08/11/2018)  Alice Smith a 75 y.o.femalehere today for follow up of dementia.Staphanie is accompanied by her husband,Mr. Newill, who aids in obtaining history. He reports that overall she has been fairly stable since her last visit in June 2019. Unfortunately there seems to be some confusion on what medication she was taking. At her last visit, she was instructed to continue Namenda 10 mg twice daily.Dr. Leta Baptist added Aricept 10 mg daily (titrating from 5mg  daily x 1 week).They are unsure how long she continued Namenda. It appears that it was discontinued from her medication list. Mr. Belue does not feel that she has taken this in some time. She did tolerate the medication well. She continues to perform all ADLs at home. She is able to dress bathe and feed herself. He reports that she can remember events from  long ago but struggles with short-term information. She does very rarely drive when accompanied by a family member. Her husband reports that this is only short distances and he either rides with her or follows her. He denies any falls or injuries. No behavioral concerns at this time.  HISTORY(copied from Dr Gladstone Lighter note on 08/06/2017)  UPDATE (08/06/17, VRP): Since last visit, doingslightly worse. Toleratingmemantine. No alleviating or  aggravating factors.Driving less now. Some diff with shopping. MMSE 19/30. Ron Parker 6/6. [place] IADL 5/8.   PRIOR HPI (02/04/17):75 year old left-handed female here for evaluation of cognitive decline, confusion, lack of emotions, difficulty processing information.  Family has noted at least 2 years of gradual onset progressive confusion, fatigue, decreased processing speed, comprehension difficulty, decreased interest in day-to-day activity specifically household chores and cleanliness, which she used to be quite involved with. She is also shown flat and emotional affect, less nurturing, less social. She has had trouble with maintaining her checkbook, finances, shopping and ordering. In June 2018 she got lost in [place] when trying to pick up their grandson.  Overall patient having decreased speech output and fluency.  Patient also underwent 7 or 8 surgeries in 2014, and family noted that many symptoms started at that time   REVIEW OF SYSTEMS: Out of a complete 14 system review of symptoms, the patient complains only of the following symptoms, memory loss, and all other reviewed systems are negative.   ALLERGIES: Allergies  Allergen Reactions  . Clarithromycin     REACTION: nausea and vomiting  . Erythromycin Ethylsuccinate     REACTION: GI upset  . Fluticasone-Salmeterol     REACTION: Sore tongue and rash  . Procaine Hcl     REACTION: swelling  . Sulfonamide Derivatives     REACTION: rash  . Topiramate     REACTION: confusion    HOME MEDICATIONS: Outpatient Medications Prior to Visit  Medication Sig Dispense Refill  . Bacillus Coagulans-Inulin (PROBIOTIC) 1-250 BILLION-MG CAPS Take by mouth daily.    . calcium-vitamin D (OSCAL WITH D) 500-200 MG-UNIT tablet Take 1 tablet by mouth daily.    . Cholecalciferol (DIALYVITE VITAMIN D 5000) 125 MCG (5000 UT) capsule Take 1 capsule (5,000 Units total) by mouth daily.    . cyanocobalamin (,VITAMIN B-12,) 1000 MCG/ML injection  Inject 1 mL (1,000 mcg total) into the muscle every 30 (thirty) days.    . famotidine (PEPCID) 20 MG tablet Take 1 tablet (20 mg total) by mouth 2 (two) times daily. 180 tablet 1  . loratadine (CLARITIN) 10 MG tablet Take 10 mg by mouth daily.    . polyethylene glycol powder (GLYCOLAX/MIRALAX) 17 GM/SCOOP powder Take 8.5-17 g by mouth daily as needed.    . donepezil (ARICEPT) 10 MG tablet TAKE 1 TABLET BY MOUTH AT  BEDTIME 90 tablet 3  . memantine (NAMENDA) 10 MG tablet TAKE 1 TABLET BY MOUTH  TWICE DAILY 180 tablet 3   No facility-administered medications prior to visit.    PAST MEDICAL HISTORY: Past Medical History:  Diagnosis Date  . Arthritis   . Breast mass 05/2001   benign  . Complication of anesthesia    slow to wake up x 1  . Concussion 2014, 05/2016  . Dry cough    at night  . Hemorrhoids   . Migraine    evaluated with Dr. Domingo Cocking 11/2009  . Osteoporosis     PAST SURGICAL HISTORY: Past Surgical History:  Procedure Laterality Date  . ABDOMINAL EXPLORATION SURGERY     for twisted  bowel  . ABDOMINAL HYSTERECTOMY     yrs ago  . BREAST SURGERY  1989   aspirated lump left breast  . CARPAL TUNNEL RELEASE     right 03/2001, left 07/2001  . ESOPHAGOGASTRODUODENOSCOPY    . KNEE ARTHROSCOPY     rt knee  . nissenfundiplication    . PARTIAL HYSTERECTOMY  1989   dyspasia, ovaries left in  . PARTIAL KNEE ARTHROPLASTY Right 06/23/2013   Procedure: RIGHT KNEE MEDIAL UNICOMPARTMENTAL ARTHROPLASTY;  Surgeon: Mauri Pole, MD;  Location: [facility];  Service: Orthopedics;  Laterality: Right;  . PARTIAL KNEE ARTHROPLASTY Left 08/31/2013   Procedure: LEFT UNICOMPARTMENTAL KNEE ATHROPLASTY   (MEDIAL) ;  Surgeon: Mauri Pole, MD;  Location: [facility];  Service: Orthopedics;  Laterality: Left;  . ROTATOR CUFF REPAIR     right 03/2001, left 07/2001    FAMILY HISTORY: Family History  Problem Relation Age of Onset  . Arthritis Mother   . Cancer Mother        lung cancer, dx'd in her 59s   . Cancer Father        throat  . COPD Brother   . Cancer Sister        breast  . Breast cancer Sister   . Breast cancer Sister   . Colon cancer Neg Hx     SOCIAL HISTORY: Social History   Socioeconomic History  . Marital status: Married    Spouse name: Not on file  . Number of children: 3  . Years of education: Not on file  . Highest education level: Not on file  Occupational History  . Occupation: home  Tobacco Use  . Smoking status: Former Smoker    Packs/day: 1.00    Years: 5.00    Pack years: 5.00    Types: Cigarettes    Quit date: 06/17/1978    Years since quitting: 41.9  . Smokeless tobacco: Never Used  . Tobacco comment: 5 pack year history  Substance and Sexual Activity  . Alcohol use: No  . Drug use: No  . Sexual activity: Not on file  Other Topics Concern  . Not on file  Social History Narrative   Second marriage, 68. Has 3 adult children, adopted 4, 6 grandkids.   From [place], [place]   Education- GED, some college   Social Determinants of Health   Financial Resource Strain: [place]   . Difficulty of Paying Living Expenses: Not hard at all  Food Insecurity: No Food Insecurity  . Worried About Charity fundraiser in the Last Year: Never true  . Ran Out of Food in the Last Year: Never true  Transportation Needs: No Transportation Needs  . Lack of Transportation (Medical): No  . Lack of Transportation (Non-Medical): No  Physical Activity: Sufficiently Active  . Days of Exercise per Week: 3 days  . Minutes of Exercise per Session: 50 min  Stress: No Stress Concern Present  . Feeling of Stress : Not at all  Social Connections: Not on file  Intimate Partner Violence: Not At Risk  . Fear of Current or Ex-Partner: No  . Emotionally Abused: No  . Physically Abused: No  . Sexually Abused: No      PHYSICAL EXAM  Vitals:   05/17/20 1421  BP: 140/83  Pulse: 70  Weight: 197 lb (89.4 kg)  Height: 5\' 1"  (1.549 m)   Body mass index is 37.22  kg/m.  Generalized: Well developed, in no acute distress, flat affect  Cardiology: normal rate and rhythm, no murmur noted Respiratory: clear to auscultation bilaterally  Neurological examination  Mentation: Alert, Unable to complete MMSE, not oriented to time or place, not able to assist with history taking. Follows all commands speech and language fluent.  Cranial nerve II-XII: Pupils were equal round reactive to light. Extraocular movements were full, visual field were full on confrontational test. Facial sensation and strength were normal. Head turning and shoulder shrug  were normal and symmetric. Motor: The motor testing reveals 5 over 5 strength of all 4 extremities. Good symmetric motor tone is noted throughout. No bradykinesia noted. No cogwheel rigidity.  Sensory: Sensory testing is intact to soft touch on all 4 extremities. No evidence of extinction is noted. Some grimacing with palpation of bilateral knee, no obvious abnormalities.  Unable to test coordination.  Gait and station: Gait is normal. Normal posture.  Reflexes: Deep tendon reflexes are symmetric and normal bilaterally.   DIAGNOSTIC DATA (LABS, IMAGING, TESTING) - I reviewed patient records, labs, notes, testing and imaging myself where available.  MMSE - Mini Mental State Exam 05/17/2020 11/18/2019 07/06/2019  Not completed: Unable to complete - Refused  Orientation to time - 1 -  Orientation to Place - 2 -  Registration - 3 -  Attention/ Calculation - 0 -  Recall - 2 -  Language- name 2 objects - 2 -  Language- repeat - 1 -  Language- follow 3 step command - 3 -  Language- read & follow direction - 0 -  Write a sentence - 0 -  Write a sentence-comments - - -  Copy design - 0 -  Copy design-comments - - -  Total score - 14 -     Lab Results  Component Value Date   WBC 5.8 03/30/2020   HGB 14.5 03/30/2020   HCT 42.6 03/30/2020   MCV 91.0 03/30/2020   PLT 232.0 03/30/2020      Component Value Date/Time    NA 144 03/30/2020 0804   K 4.0 03/30/2020 0804   CL 107 03/30/2020 0804   CO2 29 03/30/2020 0804   GLUCOSE 126 (H) 03/30/2020 0804   BUN 22 03/30/2020 0804   CREATININE 0.87 03/30/2020 0804   CREATININE 0.81 07/25/2018 1527   CALCIUM 9.7 03/30/2020 0804   PROT 6.7 03/30/2020 0804   ALBUMIN 4.0 03/30/2020 0804   AST 14 03/30/2020 0804   ALT 12 03/30/2020 0804   ALKPHOS 86 03/30/2020 0804   BILITOT 0.6 03/30/2020 0804   GFRNONAA 90 (L) 09/01/2013 0452   GFRAA >90 09/01/2013 0452   Lab Results  Component Value Date   CHOL 217 (H) 03/30/2020   HDL 54.40 03/30/2020   LDLCALC 136 (H) 03/30/2020   LDLDIRECT 129.8 10/01/2011   TRIG 134.0 03/30/2020   CHOLHDL 4 03/30/2020   No results found for: HGBA1C Lab Results  Component Value Date   VITAMINB12 173 (L) 03/30/2020   Lab Results  Component Value Date   TSH 7.07 (H) 03/30/2020       ASSESSMENT AND PLAN 75 y.o. year old female  has a past medical history of Arthritis, Breast mass (0/2542), Complication of anesthesia, Concussion (2014, 05/2016), Dry cough, Hemorrhoids, Migraine, and Osteoporosis. here with     ICD-10-CM   1. Moderate dementia without behavioral disturbance (HCC)  F03.90 donepezil (ARICEPT) 10 MG tablet      Basya is doing fairly well. We will continue Aricept and Namenda as prescribed. I have discussed clinical progression and disease process with  her daughter. We will focus on safety and comfort. She was encouraged to continue healthy lifestyle habits. She will continue close follow up with PCP. She will stay active. She will follow up with me in 6-12 months.    No orders of the defined types were placed in this encounter.    Meds ordered this encounter  Medications  . memantine (NAMENDA) 10 MG tablet    Sig: Take 1 tablet (10 mg total) by mouth 2 (two) times daily.    Dispense:  180 tablet    Refill:  3    Requesting 1 year supply    Order Specific Question:   Supervising Provider    Answer:    Melvenia Beam [id number]  . donepezil (ARICEPT) 10 MG tablet    Sig: Take 1 tablet (10 mg total) by mouth at bedtime.    Dispense:  90 tablet    Refill:  3    Requesting 1 year supply    Order Specific Question:   Supervising Provider    Answer:   Melvenia Beam [id number]      I spent 25 minutes with the patient. 50% of this time was spent counseling and educating patient on plan of care and medications.     Debbora Presto, FNP-C 05/17/2020, 4:11 PM [facility] [address] [place], [place] [address] [phone]

## 2020-05-17 NOTE — Progress Notes (Deleted)
PATIENT: NYEMAH WATTON DOB: 07/24/1945  REASON FOR VISIT: follow up HISTORY FROM: patient  No chief complaint on file.    HISTORY OF PRESENT ILLNESS: 05/17/20 ALL:  Lounette returns for follow up for dementia. She continues Aricept and Namenda.     11/18/2019 ALL:  MESA JANUS is a 75 y.o. female here today for follow up for dementia. She continues Aricept and Namenda. She is tolerating medications well. Mr Gradel is helping to dose medications. She continues ADL's independently. She is eating and drinking normally. She has had several episode of bowel incontinence. She is being treated for constipation. This has helped. She is not comfortable using public restrooms. Memory waxes and wanes. She continues to have lack of interest. She has a flat affect. No changes in movement. She is walking 2 miles a day. She continues to go to church.   HISTORY: (copied from my note on 02/23/2019)  JESSEY HUYETT is a 75 y.o. female here today for follow up for dementia. She continues Aricept 10mg  daily and Namenda 10mg  BID. She is tolerating medications well. Her husband is with her today and aids in history. He reports that memory is fairly stable. They have noticed some worsening with concentration. She is wanting to sit still more than usual. She continues to walk about 1 mile several times a week. She has had more urine and bowel incontinence. She has had two accidents over the past 6 months. She is wearing pads. She is eating well. She does seem to enjoy simple sugars. She is able to perform ADL's. She does not cook. She does not drive. No falls.   HISTORY: (copied from my note on 08/11/2018)  Schylar Allard Belvinis a 75 y.o.femalehere today for follow up of dementia.Addaline is accompanied by her husband,Mr. Delfino, who aids in obtaining history. He reports that overall she has been fairly stable since her last visit in June 2019. Unfortunately there seems to be some confusion on what  medication she was taking. At her last visit, she was instructed to continue Namenda 10 mg twice daily.Dr. Leta Baptist added Aricept 10 mg daily (titrating from 5mg  daily x 1 week).They are unsure how long she continued Namenda. It appears that it was discontinued from her medication list. Mr. Schwall does not feel that she has taken this in some time. She did tolerate the medication well. She continues to perform all ADLs at home. She is able to dress bathe and feed herself. He reports that she can remember events from long ago but struggles with short-term information. She does very rarely drive when accompanied by a family member. Her husband reports that this is only short distances and he either rides with her or follows her. He denies any falls or injuries. No behavioral concerns at this time.  HISTORY(copied from Dr Gladstone Lighter note on 08/06/2017)  UPDATE (08/06/17, VRP): Since last visit, doingslightly worse. Toleratingmemantine. No alleviating or aggravating factors.Driving less now. Some diff with shopping. MMSE 19/30. Ron Parker 6/6. [place] IADL 5/8.   PRIOR HPI (02/04/17):75 year old left-handed female here for evaluation of cognitive decline, confusion, lack of emotions, difficulty processing information.  Family has noted at least 2 years of gradual onset progressive confusion, fatigue, decreased processing speed, comprehension difficulty, decreased interest in day-to-day activity specifically household chores and cleanliness, which she used to be quite involved with. She is also shown flat and emotional affect, less nurturing, less social. She has had trouble with maintaining her checkbook, finances, shopping and ordering. In  June 2018 she got lost in [place] when trying to pick up their grandson.  Overall patient having decreased speech output and fluency.  Patient also underwent 7 or 8 surgeries in 2014, and family noted that many symptoms started at that  time   REVIEW OF SYSTEMS: Out of a complete 14 system review of symptoms, the patient complains only of the following symptoms, memory loss, and all other reviewed systems are negative.   ALLERGIES: Allergies  Allergen Reactions  . Clarithromycin     REACTION: nausea and vomiting  . Erythromycin Ethylsuccinate     REACTION: GI upset  . Fluticasone-Salmeterol     REACTION: Sore tongue and rash  . Procaine Hcl     REACTION: swelling  . Sulfonamide Derivatives     REACTION: rash  . Topiramate     REACTION: confusion    HOME MEDICATIONS: Outpatient Medications Prior to Visit  Medication Sig Dispense Refill  . Bacillus Coagulans-Inulin (PROBIOTIC) 1-250 BILLION-MG CAPS Take by mouth daily.    . calcium-vitamin D (OSCAL WITH D) 500-200 MG-UNIT tablet Take 1 tablet by mouth daily.    . Cholecalciferol (DIALYVITE VITAMIN D 5000) 125 MCG (5000 UT) capsule Take 1 capsule (5,000 Units total) by mouth daily.    . cyanocobalamin (,VITAMIN B-12,) 1000 MCG/ML injection Inject 1 mL (1,000 mcg total) into the muscle every 30 (thirty) days.    Marland Kitchen donepezil (ARICEPT) 10 MG tablet TAKE 1 TABLET BY MOUTH AT  BEDTIME 90 tablet 3  . famotidine (PEPCID) 20 MG tablet Take 1 tablet (20 mg total) by mouth 2 (two) times daily. 180 tablet 1  . loratadine (CLARITIN) 10 MG tablet Take 10 mg by mouth daily.    . memantine (NAMENDA) 10 MG tablet TAKE 1 TABLET BY MOUTH  TWICE DAILY 180 tablet 3  . polyethylene glycol powder (GLYCOLAX/MIRALAX) 17 GM/SCOOP powder Take 8.5-17 g by mouth daily as needed.     No facility-administered medications prior to visit.    PAST MEDICAL HISTORY: Past Medical History:  Diagnosis Date  . Arthritis   . Breast mass 05/2001   benign  . Complication of anesthesia    slow to wake up x 1  . Concussion 2014, 05/2016  . Dry cough    at night  . Hemorrhoids   . Migraine    evaluated with Dr. Domingo Cocking 11/2009  . Osteoporosis     PAST SURGICAL HISTORY: Past Surgical History:   Procedure Laterality Date  . ABDOMINAL EXPLORATION SURGERY     for twisted bowel  . ABDOMINAL HYSTERECTOMY     yrs ago  . BREAST SURGERY  1989   aspirated lump left breast  . CARPAL TUNNEL RELEASE     right 03/2001, left 07/2001  . ESOPHAGOGASTRODUODENOSCOPY    . KNEE ARTHROSCOPY     rt knee  . nissenfundiplication    . PARTIAL HYSTERECTOMY  1989   dyspasia, ovaries left in  . PARTIAL KNEE ARTHROPLASTY Right 06/23/2013   Procedure: RIGHT KNEE MEDIAL UNICOMPARTMENTAL ARTHROPLASTY;  Surgeon: Mauri Pole, MD;  Location: [facility];  Service: Orthopedics;  Laterality: Right;  . PARTIAL KNEE ARTHROPLASTY Left 08/31/2013   Procedure: LEFT UNICOMPARTMENTAL KNEE ATHROPLASTY   (MEDIAL) ;  Surgeon: Mauri Pole, MD;  Location: [facility];  Service: Orthopedics;  Laterality: Left;  . ROTATOR CUFF REPAIR     right 03/2001, left 07/2001    FAMILY HISTORY: Family History  Problem Relation Age of Onset  . Arthritis Mother   .  Cancer Mother        lung cancer, dx'd in her 44s  . Cancer Father        throat  . COPD Brother   . Cancer Sister        breast  . Breast cancer Sister   . Breast cancer Sister   . Colon cancer Neg Hx     SOCIAL HISTORY: Social History   Socioeconomic History  . Marital status: Married    Spouse name: Not on file  . Number of children: 3  . Years of education: Not on file  . Highest education level: Not on file  Occupational History  . Occupation: home  Tobacco Use  . Smoking status: Former Smoker    Packs/day: 1.00    Years: 5.00    Pack years: 5.00    Types: Cigarettes    Quit date: 06/17/1978    Years since quitting: 41.9  . Smokeless tobacco: Never Used  . Tobacco comment: 5 pack year history  Substance and Sexual Activity  . Alcohol use: No  . Drug use: No  . Sexual activity: Not on file  Other Topics Concern  . Not on file  Social History Narrative   Second marriage, 33. Has 3 adult children, adopted 4, 6 grandkids.   [place]    Education- GED, some college   Social Determinants of Health   Financial Resource Strain: [place]   . Difficulty of Paying Living Expenses: Not hard at all  Food Insecurity: No Food Insecurity  . Worried About Charity fundraiser in the Last Year: Never true  . Ran Out of Food in the Last Year: Never true  Transportation Needs: No Transportation Needs  . Lack of Transportation (Medical): No  . Lack of Transportation (Non-Medical): No  Physical Activity: Sufficiently Active  . Days of Exercise per Week: 3 days  . Minutes of Exercise per Session: 50 min  Stress: No Stress Concern Present  . Feeling of Stress : Not at all  Social Connections: Not on file  Intimate Partner Violence: Not At Risk  . Fear of Current or Ex-Partner: No  . Emotionally Abused: No  . Physically Abused: No  . Sexually Abused: No      PHYSICAL EXAM  There were no vitals filed for this visit. There is no height or weight on file to calculate BMI.  Generalized: Well developed, in no acute distress, flat affect  Cardiology: normal rate and rhythm, no murmur noted Respiratory: clear to auscultation bilaterally  Neurological examination  Mentation: Alert, not oriented to time, she is oriented to city and state, not able to assist with history taking. Follows all commands speech and language fluent. Repetitive in questioning.  Cranial nerve II-XII: Pupils were equal round reactive to light. Extraocular movements were full, visual field were full on confrontational test. Facial sensation and strength were normal. Uvula tongue midline. Head turning and shoulder shrug  were normal and symmetric. Motor: The motor testing reveals 5 over 5 strength of all 4 extremities. Good symmetric motor tone is noted throughout. No bradykinesia noted. No cogwheel rigidity.  Sensory: Sensory testing is intact to soft touch on all 4 extremities. No evidence of extinction is noted.  Coordination: Cerebellar testing reveals good  finger-nose-finger and heel-to-shin bilaterally.  Gait and station: Gait is normal. Normal posture.  Reflexes: Deep tendon reflexes are symmetric and normal bilaterally.   DIAGNOSTIC DATA (LABS, IMAGING, TESTING) - I reviewed patient records, labs, notes, testing  and imaging myself where available.  MMSE - Mini Mental State Exam 11/18/2019 07/06/2019 02/23/2019  Not completed: - Refused -  Orientation to time 1 - 4  Orientation to Place 2 - 3  Registration 3 - 3  Attention/ Calculation 0 - 0  Recall 2 - 3  Language- name 2 objects 2 - 2  Language- repeat 1 - 1  Language- follow 3 step command 3 - 3  Language- read & follow direction 0 - 0  Write a sentence 0 - 1  Write a sentence-comments - - -  Copy design 0 - 1  Copy design-comments - - named 3 animals  Total score 14 - 21     Lab Results  Component Value Date   WBC 5.8 03/30/2020   HGB 14.5 03/30/2020   HCT 42.6 03/30/2020   MCV 91.0 03/30/2020   PLT 232.0 03/30/2020      Component Value Date/Time   NA 144 03/30/2020 0804   K 4.0 03/30/2020 0804   CL 107 03/30/2020 0804   CO2 29 03/30/2020 0804   GLUCOSE 126 (H) 03/30/2020 0804   BUN 22 03/30/2020 0804   CREATININE 0.87 03/30/2020 0804   CREATININE 0.81 07/25/2018 1527   CALCIUM 9.7 03/30/2020 0804   PROT 6.7 03/30/2020 0804   ALBUMIN 4.0 03/30/2020 0804   AST 14 03/30/2020 0804   ALT 12 03/30/2020 0804   ALKPHOS 86 03/30/2020 0804   BILITOT 0.6 03/30/2020 0804   GFRNONAA 90 (L) 09/01/2013 0452   GFRAA >90 09/01/2013 0452   Lab Results  Component Value Date   CHOL 217 (H) 03/30/2020   HDL 54.40 03/30/2020   LDLCALC 136 (H) 03/30/2020   LDLDIRECT 129.8 10/01/2011   TRIG 134.0 03/30/2020   CHOLHDL 4 03/30/2020   No results found for: HGBA1C Lab Results  Component Value Date   VITAMINB12 173 (L) 03/30/2020   Lab Results  Component Value Date   TSH 7.07 (H) 03/30/2020       ASSESSMENT AND PLAN 75 y.o. year old female  has a past medical history  of Arthritis, Breast mass (02/6107), Complication of anesthesia, Concussion (2014, 05/2016), Dry cough, Hemorrhoids, Migraine, and Osteoporosis. here with   No diagnosis found.  Zyla is doing fairly well. We will continue Aricept and Namenda as prescribed. I have discussed concerns of progression with her daughter. May consider treatment for depression if family feels this appropriate due to concerns of being withdrawn and lack of interest. We have discussed that this could be natural progression of disease. She appears happy and no other clear concerns of depression. I have provided education regarding commonly used medications and asked family to discuss at home. Would appreciate PCP input as well. She was encouraged to continue healthy lifestyle habits. She will stay active. She will follow up with me in 6 months.    No orders of the defined types were placed in this encounter.    No orders of the defined types were placed in this encounter.     I spent 25 minutes with the patient. 50% of this time was spent counseling and educating patient on plan of care and medications.     Debbora Presto, FNP-C 05/17/2020, 11:31 AM [facility] [address] [place], [place] [address] [phone]

## 2020-05-17 NOTE — Patient Instructions (Incomplete)
Below is our plan:  We will continue Aricept 10mg  daily and Namenda 10mg  twice daily.   Please make sure you are staying well hydrated. I recommend 50-60 ounces daily. Well balanced diet and regular exercise encouraged. Consistent sleep schedule with 6-8 hours recommended.   Please continue follow up with care team as directed.   Follow up   You may receive a survey regarding today's visit. I encourage you to leave honest feed back as I do use this information to improve patient care. Thank you for seeing me today!      Memory Compensation Strategies  1. Use "WARM" strategy.  W= write it down  A= associate it  R= repeat it  M= make a mental note  2.   You can keep a Social worker.  Use a 3-ring notebook with sections for the following: calendar, important names and phone numbers,  medications, doctors' names/phone numbers, lists/reminders, and a section to journal what you did  each day.   3.    Use a calendar to write appointments down.  4.    Write yourself a schedule for the day.  This can be placed on the calendar or in a separate section of the Memory Notebook.  Keeping a  regular schedule can help memory.  5.    Use medication organizer with sections for each day or morning/evening pills.  You may need help loading it  6.    Keep a basket, or pegboard by the door.  Place items that you need to take out with you in the basket or on the pegboard.  You may also want to  include a message board for reminders.  7.    Use sticky notes.  Place sticky notes with reminders in a place where the task is performed.  For example: " turn off the  stove" placed by the stove, "lock the door" placed on the door at eye level, " take your medications" on  the bathroom mirror or by the place where you normally take your medications.  8.    Use alarms/timers.  Use while cooking to remind yourself to check on food or as a reminder to take your medicine, or as a  reminder to make a call,  or as a reminder to perform another task, etc.    https://point-of-care.elsevierperformancemanager.com/skills">  Dementia Caregiver Guide Dementia is a term used to describe a number of symptoms that affect memory and thinking. The most common symptoms include:  Memory loss.  Trouble with language and communication.  Trouble concentrating.  Poor judgment and problems with reasoning.  Wandering from home or public places.  Extreme anxiety or depression.  Being suspicious or having angry outbursts and accusations.  Child-like behavior and language. Dementia can be frightening and confusing. And taking care of someone with dementia can be challenging. This guide provides tips to help you when providing care for a person with dementia. How to help manage lifestyle changes Dementia usually gets worse slowly over time. In the early stages, people with dementia can stay independent and safe with some help. In later stages, they need help with daily tasks such as dressing, grooming, and using the bathroom. There are actions you can take to help a person manage his or her life while living with this condition. Communicating  When the person is talking or seems frustrated, make eye contact and hold the person's hand.  Ask specific questions that need yes or no answers.  Use simple words, short sentences,  and a calm voice. Only give one direction at a time.  When offering choices, limit the person to just one or two.  Avoid correcting the person in a negative way.  If the person is struggling to find the right words, gently try to help him or her. Preventing injury  Keep floors clear of clutter. Remove rugs, magazine racks, and floor lamps.  Keep hallways well lit, especially at night.  Put a handrail and nonslip mat in the bathtub or shower.  Put childproof locks on cabinets that contain dangerous items, such as medicines, alcohol, guns, toxic cleaning items, sharp tools or  utensils, matches, and lighters.  For doors to the outside of the house, put the locks in places where the person cannot see or reach them easily. This will help ensure that the person does not wander out of the house and get lost.  Be prepared for emergencies. Keep a list of emergency phone numbers and addresses in a convenient area.  Remove car keys and lock garage doors so that the person does not try to get in the car and drive.  Have the person wear a bracelet that tracks locations and identifies the person as having memory problems. This should be worn at all times for safety.   Helping with daily life  Keep the person on track with his or her routine.  Try to identify areas where the person may need help.  Be supportive, patient, calm, and encouraging.  Gently remind the person that adjusting to changes takes time.  Help with the tasks that the person has asked for help with.  Keep the person involved in daily tasks and decisions as much as possible.  Encourage conversation, but try not to get frustrated if the person struggles to find words or does not seem to appreciate your help.   How to recognize stress Look for signs of stress in yourself and in the person you are caring for. If you notice signs of stress, take steps to manage it. Symptoms of stress include:  Feeling anxious, irritable, frustrated, or angry.  Denying that the person has dementia or that his or her symptoms will not improve.  Feeling depressed, hopeless, or unappreciated.  Difficulty sleeping.  Difficulty concentrating.  Developing stress-related health problems.  Feeling like you have too little time for your own life. Follow these instructions at home: Take care of your health Make sure that you and the person you are caring for:  Get regular sleep.  Exercise regularly.  Eat regular, nutritious meals.  Take over-the-counter and prescription medicines only as told by your health care  providers.  Drink enough fluid to keep your urine pale yellow.  Attend all scheduled health care appointments.   General instructions  Join a support group with others who are caregivers.  Ask about respite care resources. Respite care can provide short-term care for the person so that you can have a regular break from the stress of caregiving.  Consider any safety risks and take steps to avoid them.  Organize medicines in a pill box for each day of the week.  Create a plan to handle any legal or financial matters. Get legal or financial advice if needed.  Keep a calendar in a central location to remind the person of appointments or other activities. Where to find support: Many individuals and organizations offer support. These include:  Support groups for people with dementia.  Support groups for caregivers.  Counselors or therapists.  Home health  care services.  Adult day care centers. Where to find more information  Centers for Disease Control and Prevention: http://www.wolf.info/  Alzheimer's Association: [place].co.nz  Family Caregiver Alliance: www.caregiver.[place]: www.alzfdn.org Contact a health care provider if:  The person's health is rapidly getting worse.  You are no longer able to care for the person.  Caring for the person is affecting your physical and emotional health.  You are feeling depressed or anxious about caring for the person. Get help right away if:  The person threatens himself or herself, you, or anyone else.  You feel depressed or sad, or feel that you want to harm yourself. If you ever feel like your loved one may hurt himself or herself or others, or if he or she shares thoughts about taking his or her own life, get help right away. You can go to your nearest emergency department or:  Call your local emergency services (911 in the [place]  Call a suicide crisis helpline, such as the [place] at 2566549598. This is open 24 hours a day in the [place]  Text the Crisis Text Line at (607)843-7704 (in the [place].). Summary  Dementia is a term used to describe a number of symptoms that affect memory and thinking.  Dementia usually gets worse slowly over time.  Take steps to reduce the person's risk of injury and to plan for future care.  Caregivers need support, relief from caregiving, and time for their own lives. This information is not intended to replace advice given to you by your health care provider. Make sure you discuss any questions you have with your health care provider. Document Revised: 06/22/2019 Document Reviewed: 06/22/2019 Elsevier Patient Education  [place].

## 2020-06-08 ENCOUNTER — Telehealth: Payer: Self-pay

## 2020-06-08 NOTE — Telephone Encounter (Signed)
Mr Constantin (DPR signed) left v/m that he understands there is a second covid booster and wants to know if pt should take the 2nd covid booster; on 06/06/20 pt got shingles vaccine at [facility] and pt has already had 2 covid vaccine and got 1st covid booster on 01/06/2020.Please advise.

## 2020-06-08 NOTE — Telephone Encounter (Signed)
Assuming the guidelines don't change in the interval, would get the 2nd booster 1 month after the shingles shot.  Please update shingles shot in the EMR.  Thanks.

## 2020-06-09 NOTE — Telephone Encounter (Signed)
LMTCB; also EMR updated with shingles vaccine.

## 2020-06-10 NOTE — Telephone Encounter (Signed)
Spoke with patients husband Noral (okay per DPR) and advised okay to get 2nd covid booster 1 month after having shingles shot. Noral voiced understanding.

## 2020-06-15 ENCOUNTER — Ambulatory Visit (INDEPENDENT_AMBULATORY_CARE_PROVIDER_SITE_OTHER): Payer: Medicare Other

## 2020-06-15 DIAGNOSIS — E538 Deficiency of other specified B group vitamins: Secondary | ICD-10-CM | POA: Diagnosis not present

## 2020-06-15 MED ORDER — CYANOCOBALAMIN 1000 MCG/ML IJ SOLN
1000.0000 ug | Freq: Once | INTRAMUSCULAR | Status: AC
Start: 2020-06-15 — End: 2020-06-15
  Administered 2020-06-15: 1000 ug via INTRAMUSCULAR

## 2020-06-15 NOTE — Progress Notes (Signed)
Patient presented for B 12 injection given by Geary Rufo, CMA to left deltoid, patient voiced no concerns nor showed any signs of distress during injection.  

## 2020-06-20 NOTE — Progress Notes (Signed)
I reviewed note and agree with plan.   Penni Bombard, MD 03/26/9561, 8:75 PM Certified in Neurology, Neurophysiology and Neuroimaging  [facility] [address] [place], [place] [address] [phone]

## 2020-07-06 ENCOUNTER — Ambulatory Visit: Payer: Medicare Other

## 2020-07-06 ENCOUNTER — Other Ambulatory Visit: Payer: Self-pay

## 2020-07-20 ENCOUNTER — Ambulatory Visit: Payer: Medicare Other

## 2020-07-20 ENCOUNTER — Other Ambulatory Visit: Payer: Medicare Other

## 2020-08-03 ENCOUNTER — Other Ambulatory Visit (INDEPENDENT_AMBULATORY_CARE_PROVIDER_SITE_OTHER): Payer: Medicare Other

## 2020-08-03 ENCOUNTER — Other Ambulatory Visit: Payer: Self-pay

## 2020-08-03 ENCOUNTER — Ambulatory Visit (INDEPENDENT_AMBULATORY_CARE_PROVIDER_SITE_OTHER): Payer: Medicare Other

## 2020-08-03 DIAGNOSIS — R7989 Other specified abnormal findings of blood chemistry: Secondary | ICD-10-CM | POA: Diagnosis not present

## 2020-08-03 DIAGNOSIS — E538 Deficiency of other specified B group vitamins: Secondary | ICD-10-CM

## 2020-08-03 LAB — VITAMIN B12: Vitamin B-12: 331 pg/mL (ref 211–911)

## 2020-08-03 LAB — TSH: TSH: 6.45 u[IU]/mL — ABNORMAL HIGH (ref 0.35–4.50)

## 2020-08-03 MED ORDER — CYANOCOBALAMIN 1000 MCG/ML IJ SOLN
1000.0000 ug | Freq: Once | INTRAMUSCULAR | Status: AC
  Administered 2020-08-03: 1000 ug via INTRAMUSCULAR

## 2020-08-03 NOTE — Progress Notes (Signed)
Patient presented for B 12 injection given by Francena Zender, CMA to right deltoid, patient voiced no concerns nor showed any signs of distress during injection.  

## 2020-09-05 ENCOUNTER — Telehealth: Payer: Self-pay | Admitting: Family Medicine

## 2020-09-05 ENCOUNTER — Other Ambulatory Visit: Payer: Self-pay | Admitting: Neurology

## 2020-09-05 DIAGNOSIS — F03B0 Unspecified dementia, moderate, without behavioral disturbance, psychotic disturbance, mood disturbance, and anxiety: Secondary | ICD-10-CM

## 2020-09-05 DIAGNOSIS — F0390 Unspecified dementia without behavioral disturbance: Secondary | ICD-10-CM

## 2020-09-05 MED ORDER — DONEPEZIL HCL 10 MG PO TABS: 10.0000 mg | ORAL_TABLET | Freq: Every day | ORAL | 2 refills | Status: DC

## 2020-09-05 NOTE — Telephone Encounter (Signed)
Pt's husband called requesting wife's refill for memantine (NAMENDA) 10 MG tablet and donepezil (ARICEPT) 10 MG tablet. Pharmacy [facility].

## 2020-09-05 NOTE — Telephone Encounter (Signed)
Refilled for the pt and [facility]

## 2020-09-07 ENCOUNTER — Other Ambulatory Visit: Payer: Self-pay

## 2020-09-07 ENCOUNTER — Ambulatory Visit (INDEPENDENT_AMBULATORY_CARE_PROVIDER_SITE_OTHER): Payer: Medicare Other

## 2020-09-07 DIAGNOSIS — E538 Deficiency of other specified B group vitamins: Secondary | ICD-10-CM

## 2020-09-07 MED ORDER — CYANOCOBALAMIN 1000 MCG/ML IJ SOLN
1000.0000 ug | Freq: Once | INTRAMUSCULAR | Status: AC
  Administered 2020-09-07: 1000 ug via INTRAMUSCULAR

## 2020-09-07 NOTE — Progress Notes (Signed)
Per orders of Dr. Leonard Downing signed by Alma Friendly, NP in his absence, injection of B12 monthly given by Kris Mouton. Patient tolerated injection well.

## 2020-10-10 ENCOUNTER — Telehealth: Payer: Self-pay | Admitting: Family Medicine

## 2020-10-10 ENCOUNTER — Other Ambulatory Visit: Payer: Self-pay | Admitting: *Deleted

## 2020-10-10 MED ORDER — MEMANTINE HCL 10 MG PO TABS: 10.0000 mg | ORAL_TABLET | Freq: Two times a day (BID) | ORAL | 3 refills | Status: DC

## 2020-10-10 NOTE — Telephone Encounter (Signed)
Pt's daughter called requesting refill for mother's memantine (NAMENDA) 10 MG tablet. Pharmacy [facility]  [facility]). If there any questions please give daughter a call.

## 2020-10-10 NOTE — Telephone Encounter (Signed)
E-scribed refill as requested. 

## 2020-10-12 ENCOUNTER — Other Ambulatory Visit: Payer: Self-pay

## 2020-10-12 ENCOUNTER — Ambulatory Visit (INDEPENDENT_AMBULATORY_CARE_PROVIDER_SITE_OTHER): Payer: Medicare Other

## 2020-10-12 DIAGNOSIS — E538 Deficiency of other specified B group vitamins: Secondary | ICD-10-CM

## 2020-10-12 MED ORDER — CYANOCOBALAMIN 1000 MCG/ML IJ SOLN
1000.0000 ug | Freq: Once | INTRAMUSCULAR | Status: AC
  Administered 2020-10-12: 1000 ug via INTRAMUSCULAR

## 2020-10-12 NOTE — Progress Notes (Signed)
Patient presented for B 12 injection given by Benny Deutschman, CMA to right deltoid, patient voiced no concerns nor showed any signs of distress during injection.  

## 2020-10-31 ENCOUNTER — Encounter: Payer: Self-pay | Admitting: Nurse Practitioner

## 2020-10-31 ENCOUNTER — Ambulatory Visit (INDEPENDENT_AMBULATORY_CARE_PROVIDER_SITE_OTHER): Payer: Medicare Other | Admitting: Nurse Practitioner

## 2020-10-31 VITALS — Temp 97.7°F

## 2020-10-31 DIAGNOSIS — U071 COVID-19: Secondary | ICD-10-CM | POA: Diagnosis not present

## 2020-10-31 MED ORDER — MOLNUPIRAVIR EUA 200MG CAPSULE: 4.0000 | ORAL_CAPSULE | Freq: Two times a day (BID) | ORAL | 0 refills | Status: AC

## 2020-10-31 NOTE — Assessment & Plan Note (Signed)
Discussed different treatment options available.  We did decide to go with molnupiravir.  Did discuss that this is an emergency use authorization medication currently not fully FDA approved.  Discussed most common side effects including fertility issues.  Is given to patient states that should not be a concern but wanted full transparency.  We will begin molnupiravir. Did discuss CDC guidelines in regards to quarantine and mask wearing.  They did ask if patient should be retested I told them I do not recommend retesting as long as symptoms are improving and patient is fever free for 24 hours prior to coming out of quarantine.

## 2020-10-31 NOTE — Progress Notes (Signed)
Patient ID: Alice Smith, female    DOB: Apr 04, 1945, 75 y.o.   MRN: [id number]  Virtual visit completed through telephone. Due to national recommendations of social distancing due to COVID-19, a virtual visit is felt to be most appropriate for this patient at this time. Reviewed limitations, risks, security and privacy concerns of performing a virtual visit and the availability of in person appointments. I also reviewed that there may be a patient responsible charge related to this service. The patient agreed to proceed.   Patient location: home Provider location:  [facility], office Persons participating in this virtual visit: patient, provider, patients spouse and patient's daughter    If any vitals were documented, they were collected by patient at home unless specified below.    Temp 97.7 F (36.5 C) Comment: per patient   CC: Covid 19 Infection Subjective:   HPI: Alice Smith is a 75 y.o. female presenting on 10/31/2020 for  States that symptoms began on 10/30/2020. They did 2 at home covid tests and they both came back positive. They have been using mucinex and ibuprofen for symptom relief, with moderate results.  Patient has been vaccinated against covid 19.   Patient has dementia and primary source of information was from patients spouse Noral.   ROS limited to what family provided     Relevant past medical, surgical, family and social history reviewed and updated as indicated. Interim medical history since our last visit reviewed. Allergies and medications reviewed and updated. Outpatient Medications Prior to Visit  Medication Sig Dispense Refill   Bacillus Coagulans-Inulin (PROBIOTIC) 1-250 BILLION-MG CAPS Take by mouth daily.     calcium-vitamin D (OSCAL WITH D) 500-200 MG-UNIT tablet Take 1 tablet by mouth daily.     Cholecalciferol (DIALYVITE VITAMIN D 5000) 125 MCG (5000 UT) capsule Take 1 capsule (5,000 Units total) by mouth daily.      cyanocobalamin (,VITAMIN B-12,) 1000 MCG/ML injection Inject 1 mL (1,000 mcg total) into the muscle every 30 (thirty) days.     donepezil (ARICEPT) 10 MG tablet Take 1 tablet (10 mg total) by mouth at bedtime. 90 tablet 2   famotidine (PEPCID) 20 MG tablet Take 1 tablet (20 mg total) by mouth 2 (two) times daily. 180 tablet 1   loratadine (CLARITIN) 10 MG tablet Take 10 mg by mouth daily.     memantine (NAMENDA) 10 MG tablet Take 1 tablet (10 mg total) by mouth 2 (two) times daily. 180 tablet 3   polyethylene glycol powder (GLYCOLAX/MIRALAX) 17 GM/SCOOP powder Take 8.5-17 g by mouth daily as needed.     COVID-19 mRNA vaccine, Pfizer, 30 MCG/0.3ML injection NO SIG! PULL HARDCOPY! .3 mL 0   No facility-administered medications prior to visit.     Per HPI unless specifically indicated in ROS section below Review of Systems  Constitutional:  Positive for fever.  HENT:  Positive for congestion. Negative for sore throat.   Respiratory:  Positive for cough.   Gastrointestinal:  Negative for diarrhea and vomiting.  Objective:  Temp 97.7 F (36.5 C) Comment: per patient  Wt Readings from Last 3 Encounters:  05/17/20 197 lb (89.4 kg)  04/07/20 191 lb (86.6 kg)  01/25/20 183 lb (83 kg)       Physical exam: Gen: alert, NAD, not ill appearing Pulm: speaks in complete sentences without increased work of breathing Psych: normal mood, normal thought content      Results for orders placed or performed in visit on 08/03/20  Vitamin B12  Result Value Ref Range   Vitamin B-12 331 211 - 911 pg/mL  TSH  Result Value Ref Range   TSH 6.45 (H) 0.35 - 4.50 uIU/mL   Assessment & Plan:   Problem List Items Addressed This Visit   None   Phone call lasted 16 mins and 11 seconds No orders of the defined types were placed in this encounter.  No orders of the defined types were placed in this encounter.   I discussed the assessment and treatment plan with the patient. The patient was provided an  opportunity to ask questions and all were answered. The patient agreed with the plan and demonstrated an understanding of the instructions. The patient was advised to call back or seek an in-person evaluation if the symptoms worsen or if the condition fails to improve as anticipated.  Follow up plan: No follow-ups on file.  Romilda Garret, NP

## 2020-11-14 NOTE — Progress Notes (Signed)
PATIENT: Alice Smith DOB: January 11, 1946  REASON FOR VISIT: follow up HISTORY FROM: patient  Chief Complaint  Patient presents with   Follow-up    RM 10 with daughter, Alice Smith who lives in [place], [place]. Last seen 05/17/20. Her mother lives with husband. Both had covid-19 and handled well. Took OTC meds, got molnupiravir. Dad helps with incontinence issues. Still showering independently, husband helps her dry off. Needs reminding to brush teeth now. Sleep/eating going well.       HISTORY OF PRESENT ILLNESS: 11/17/20 ALL:  Alice Smith returns for follow up for dementia. She continues Namenda and Aricept. Memory seems stable. No significant changes. She is sleeping well. Remains mostly independent with ADLs but does need help preparing meals and medications.  She had Covid about a month ago. She had mild symptoms. She took antiviral and recovered well. She continues to walk three times a day. She is followed closely by PCP.   05/17/2020 ALL:  [name] returns for follow up for dementia. She continues Aricept and Namenda. She presents with Alice Smith, her daughter, who aids in history. Memory waxes and wanes. Seems happy but mostly confused. She does not complain of any pain unless an area (knees, toes) are manipulated. She needs to be reminded to try to use the restroom. Occasionally has accidents. She will not use public restroom. No UTIs. Family reminds her to drink fluids. Miralax is helping with constipation. She showers independently. Some assistance with getting dressed. She will eat if food is provided but she rarely initiates meals. She will pick up a snack from the counter. She continues to walk 1-2 miles daily. No difficulty with gait. No falls. Has 7 children. Husband and children provide 24/7 supervision. Youngest son (of 7 children) still lives at home.   11/18/2019 ALL:  Alice Smith is a 75 y.o. female here today for follow up for dementia. She continues Aricept and Namenda. She is  tolerating medications well. Mr Word is helping to dose medications. She continues ADL's independently. She is eating and drinking normally. She has had several episode of bowel incontinence. She is being treated for constipation. This has helped. She is not comfortable using public restrooms. Memory waxes and wanes. She continues to have lack of interest. She has a flat affect. No changes in movement. She is walking 2 miles a day. She continues to go to church.   HISTORY: (copied from my note on 02/23/2019)  Alice Smith is a 75 y.o. female here today for follow up for dementia. She continues Aricept 10mg  daily and Namenda 10mg  BID. She is tolerating medications well. Her husband is with her today and aids in history. He reports that memory is fairly stable. They have noticed some worsening with concentration. She is wanting to sit still more than usual. She continues to walk about 1 mile several times a week. She has had more urine and bowel incontinence. She has had two accidents over the past 6 months. She is wearing pads. She is eating well. She does seem to enjoy simple sugars. She is able to perform ADL's. She does not cook. She does not drive. No falls.    HISTORY: (copied from my note on 08/11/2018)   Alice Smith is a 75 y.o. female here today for follow up of dementia.  Alice Smith is accompanied by her husband, Mr. Difatta, who aids in obtaining history.  He reports that overall she has been fairly stable since her last visit in June 2019.  Unfortunately there seems to be some confusion on what medication she was taking. At her last visit, she was instructed to continue Namenda 10 mg twice daily.  Dr. Leta Baptist added Aricept 10 mg daily (titrating from 5mg  daily x 1 week).  They are unsure how long she continued Namenda.  It appears that it was discontinued from her medication list.  Mr. Spees does not feel that she has taken this in some time.  She did tolerate the medication well.  She  continues to perform all ADLs at home.  She is able to dress bathe and feed herself.  He reports that she can remember events from long ago but struggles with short-term information.  She does very rarely drive when accompanied by a family member.  Her husband reports that this is only short distances and he either rides with her or follows her.  He denies any falls or injuries.  No behavioral concerns at this time.   HISTORY (copied from Dr Gladstone Lighter note on 08/06/2017)    UPDATE (08/06/17, VRP): Since last visit, doing slightly worse. Tolerating memantine. No alleviating or aggravating factors. Driving less now. Some diff with shopping. MMSE 19/30. Alice Smith 6/6. Alice Smith IADL 5/8.    PRIOR HPI (02/04/17): 75 year old left-handed female here for evaluation of cognitive decline, confusion, lack of emotions, difficulty processing information.   Family has noted at least 2 years of gradual onset progressive confusion, fatigue, decreased processing speed, comprehension difficulty, decreased interest in day-to-day activity specifically household chores and cleanliness, which she used to be quite involved with.  She is also shown flat and emotional affect, less nurturing, less social.  She has had trouble with maintaining her checkbook, finances, shopping and ordering.  In June 2018 she got lost in [place] when trying to pick up their grandson.   Overall patient having decreased speech output and fluency.   Patient also underwent 7 or 8 surgeries in 2014, and family noted that many symptoms started at that time   REVIEW OF SYSTEMS: Out of a complete 14 system review of symptoms, the patient complains only of the following symptoms, memory loss, incontinence and all other reviewed systems are negative.   ALLERGIES: Allergies  Allergen Reactions   Clarithromycin     REACTION: nausea and vomiting   Erythromycin Ethylsuccinate     REACTION: GI upset   Fluticasone-Salmeterol     REACTION: Sore tongue  and rash   Procaine Hcl     REACTION: swelling   Sulfonamide Derivatives     REACTION: rash   Topiramate     REACTION: confusion    HOME MEDICATIONS: Outpatient Medications Prior to Visit  Medication Sig Dispense Refill   Bacillus Coagulans-Inulin (PROBIOTIC) 1-250 BILLION-MG CAPS Take by mouth daily.     calcium-vitamin D (OSCAL WITH D) 500-200 MG-UNIT tablet Take 1 tablet by mouth daily.     Cholecalciferol (DIALYVITE VITAMIN D 5000) 125 MCG (5000 UT) capsule Take 1 capsule (5,000 Units total) by mouth daily.     cyanocobalamin (,VITAMIN B-12,) 1000 MCG/ML injection Inject 1 mL (1,000 mcg total) into the muscle every 30 (thirty) days.     donepezil (ARICEPT) 10 MG tablet Take 1 tablet (10 mg total) by mouth at bedtime. 90 tablet 2   famotidine (PEPCID) 20 MG tablet Take 1 tablet (20 mg total) by mouth 2 (two) times daily. 180 tablet 1   loratadine (CLARITIN) 10 MG tablet Take 10 mg by mouth daily.     memantine (NAMENDA) 10 MG tablet  Take 1 tablet (10 mg total) by mouth 2 (two) times daily. 180 tablet 3   polyethylene glycol powder (GLYCOLAX/MIRALAX) 17 GM/SCOOP powder Take 8.5-17 g by mouth daily as needed.     No facility-administered medications prior to visit.    PAST MEDICAL HISTORY: Past Medical History:  Diagnosis Date   Arthritis    Breast mass 02/270   benign   Complication of anesthesia    slow to wake up x 1   Concussion 2014, 05/2016   Dry cough    at night   Hemorrhoids    Migraine    evaluated with Dr. Domingo Cocking 11/2009   Osteoporosis     PAST SURGICAL HISTORY: Past Surgical History:  Procedure Laterality Date   ABDOMINAL EXPLORATION SURGERY     for twisted bowel   ABDOMINAL HYSTERECTOMY     yrs ago   [place]   aspirated lump left breast   CARPAL TUNNEL RELEASE     right 03/2001, left 07/2001   ESOPHAGOGASTRODUODENOSCOPY     KNEE ARTHROSCOPY     rt knee   nissenfundiplication     PARTIAL HYSTERECTOMY  1989   dyspasia, ovaries left in    PARTIAL KNEE ARTHROPLASTY Right 06/23/2013   Procedure: RIGHT KNEE MEDIAL UNICOMPARTMENTAL ARTHROPLASTY;  Surgeon: Mauri Pole, MD;  Location: [facility];  Service: Orthopedics;  Laterality: Right;   PARTIAL KNEE ARTHROPLASTY Left 08/31/2013   Procedure: LEFT UNICOMPARTMENTAL KNEE ATHROPLASTY   (MEDIAL) ;  Surgeon: Mauri Pole, MD;  Location: [facility];  Service: Orthopedics;  Laterality: Left;   ROTATOR CUFF REPAIR     right 03/2001, left 07/2001    FAMILY HISTORY: Family History  Problem Relation Age of Onset   Arthritis Mother    Cancer Mother        lung cancer, dx'd in her 58s   Cancer Father        throat   COPD Brother    Cancer Sister        breast   Breast cancer Sister    Breast cancer Sister    Colon cancer Neg Hx     SOCIAL HISTORY: Social History   Socioeconomic History   Marital status: Married    Spouse name: Not on file   Number of children: 3   Years of education: Not on file   Highest education level: Not on file  Occupational History   Occupation: home  Tobacco Use   Smoking status: Former    Packs/day: 1.00    Years: 5.00    Pack years: 5.00    Types: Cigarettes    Quit date: 06/17/1978    Years since quitting: 42.4   Smokeless tobacco: Never   Tobacco comments:    5 pack year history  Substance and Sexual Activity   Alcohol use: No   Drug use: No   Sexual activity: Not on file  Other Topics Concern   Not on file  Social History Narrative   Second marriage, 69. Has 3 adult children, adopted 4, 6 grandkids.   From [place], [place]   Education- GED, some college   Social Determinants of Health   Financial Resource Strain: Not on file  Food Insecurity: Not on file  Transportation Needs: Not on file  Physical Activity: Not on file  Stress: Not on file  Social Connections: Not on file  Intimate Partner Violence: Not on file      PHYSICAL EXAM  Vitals:   11/17/20 1245  BP: (!) 144/74  Pulse: 75  Weight: 193 lb 8 oz (87.8 kg)   Height: 5\' 1"  (1.549 m)    Body mass index is 36.56 kg/m.  Generalized: Well developed, in no acute distress, flat affect  Cardiology: normal rate and rhythm, no murmur noted Respiratory: clear to auscultation bilaterally  Neurological examination  Mentation: Alert, Unable to complete MMSE, not oriented to time or place, not able to assist with history taking. Follows all commands speech and language fluent.  Cranial nerve II-XII: Pupils were equal round reactive to light. Extraocular movements were full, visual field were full on confrontational test. Facial sensation and strength were normal. Head turning and shoulder shrug  were normal and symmetric. Motor: The motor testing reveals 5 over 5 strength of all 4 extremities. Good symmetric motor tone is noted throughout. No bradykinesia noted. No cogwheel rigidity.  Unable to test coordination.  Gait and station: Gait is normal. Normal posture.    DIAGNOSTIC DATA (LABS, IMAGING, TESTING) - I reviewed patient records, labs, notes, testing and imaging myself where available.  MMSE - Mini Mental State Exam 05/17/2020 11/18/2019 07/06/2019  Not completed: Unable to complete - Refused  Orientation to time - 1 -  Orientation to Place - 2 -  Registration - 3 -  Attention/ Calculation - 0 -  Recall - 2 -  Language- name 2 objects - 2 -  Language- repeat - 1 -  Language- follow 3 step command - 3 -  Language- read & follow direction - 0 -  Write a sentence - 0 -  Write a sentence-comments - - -  Copy design - 0 -  Copy design-comments - - -  Total score - 14 -     Lab Results  Component Value Date   WBC 5.8 03/30/2020   HGB 14.5 03/30/2020   HCT 42.6 03/30/2020   MCV 91.0 03/30/2020   PLT 232.0 03/30/2020      Component Value Date/Time   NA 144 03/30/2020 0804   K 4.0 03/30/2020 0804   CL 107 03/30/2020 0804   CO2 29 03/30/2020 0804   GLUCOSE 126 (H) 03/30/2020 0804   BUN 22 03/30/2020 0804   CREATININE 0.87 03/30/2020  0804   CREATININE 0.81 07/25/2018 1527   CALCIUM 9.7 03/30/2020 0804   PROT 6.7 03/30/2020 0804   ALBUMIN 4.0 03/30/2020 0804   AST 14 03/30/2020 0804   ALT 12 03/30/2020 0804   ALKPHOS 86 03/30/2020 0804   BILITOT 0.6 03/30/2020 0804   GFRNONAA 90 (L) 09/01/2013 0452   GFRAA >90 09/01/2013 0452   Lab Results  Component Value Date   CHOL 217 (H) 03/30/2020   HDL 54.40 03/30/2020   LDLCALC 136 (H) 03/30/2020   LDLDIRECT 129.8 10/01/2011   TRIG 134.0 03/30/2020   CHOLHDL 4 03/30/2020   No results found for: HGBA1C Lab Results  Component Value Date   VITAMINB12 331 08/03/2020   Lab Results  Component Value Date   TSH 6.45 (H) 08/03/2020       ASSESSMENT AND PLAN 75 y.o. year old female  has a past medical history of Arthritis, Breast mass (02/5398), Complication of anesthesia, Concussion (2014, 05/2016), Dry cough, Hemorrhoids, Migraine, and Osteoporosis. here with     ICD-10-CM   1. Moderate dementia without behavioral disturbance ([place])  F03.90        Sheenah is doing fairly well. We will continue Aricept and Namenda as prescribed. I have discussed clinical progression and disease process with her  daughter. We will focus on safety and comfort. She was encouraged to continue healthy lifestyle habits. She will continue close follow up with PCP. She will stay active. She will follow up with me in 6-12 months.    No orders of the defined types were placed in this encounter.    No orders of the defined types were placed in this encounter.      Debbora Presto, FNP-C 11/17/2020, 1:08 PM [facility] [address], [place], [place] [address] [phone]

## 2020-11-17 ENCOUNTER — Ambulatory Visit: Payer: Medicare Other | Admitting: Family Medicine

## 2020-11-17 ENCOUNTER — Encounter: Payer: Self-pay | Admitting: Family Medicine

## 2020-11-17 VITALS — BP 144/74 | HR 75 | Ht 61.0 in | Wt 193.5 lb

## 2020-11-17 DIAGNOSIS — F0390 Unspecified dementia without behavioral disturbance: Secondary | ICD-10-CM

## 2020-11-17 DIAGNOSIS — F03B0 Unspecified dementia, moderate, without behavioral disturbance, psychotic disturbance, mood disturbance, and anxiety: Secondary | ICD-10-CM

## 2020-11-17 MED ORDER — DONEPEZIL HCL 10 MG PO TABS: 10.0000 mg | ORAL_TABLET | Freq: Every day | ORAL | 3 refills | Status: DC

## 2020-11-17 MED ORDER — MEMANTINE HCL 10 MG PO TABS: 10.0000 mg | ORAL_TABLET | Freq: Two times a day (BID) | ORAL | 3 refills | Status: DC

## 2020-11-17 NOTE — Patient Instructions (Signed)
Below is our plan:  We will continue Namenda and Aricept. Ask Dr Damita Dunnings about B12 replacement.   Please make sure you are staying well hydrated. I recommend 50-60 ounces daily. Well balanced diet and regular exercise encouraged. Consistent sleep schedule with 6-8 hours recommended.   Please continue follow up with care team as directed.   Follow up with me in 6 months   You may receive a survey regarding today's visit. I encourage you to leave honest feed back as I do use this information to improve patient care. Thank you for seeing me today!

## 2020-11-23 ENCOUNTER — Ambulatory Visit (INDEPENDENT_AMBULATORY_CARE_PROVIDER_SITE_OTHER): Payer: Medicare Other

## 2020-11-23 ENCOUNTER — Other Ambulatory Visit: Payer: Self-pay

## 2020-11-23 DIAGNOSIS — E538 Deficiency of other specified B group vitamins: Secondary | ICD-10-CM

## 2020-11-23 MED ORDER — CYANOCOBALAMIN 1000 MCG/ML IJ SOLN
1000.0000 ug | Freq: Once | INTRAMUSCULAR | Status: AC
  Administered 2020-11-23: 1000 ug via INTRAMUSCULAR

## 2020-11-23 NOTE — Progress Notes (Signed)
Per orders of Dr. Glori Bickers, in Dr. Josefine Class absence, monthly injection of B12 given by Loreen Freud. Patient tolerated injection well.

## 2020-12-27 ENCOUNTER — Other Ambulatory Visit: Payer: Self-pay

## 2020-12-27 ENCOUNTER — Ambulatory Visit (INDEPENDENT_AMBULATORY_CARE_PROVIDER_SITE_OTHER): Payer: Medicare Other

## 2020-12-27 DIAGNOSIS — E538 Deficiency of other specified B group vitamins: Secondary | ICD-10-CM | POA: Diagnosis not present

## 2020-12-27 MED ORDER — CYANOCOBALAMIN 1000 MCG/ML IJ SOLN
1000.0000 ug | Freq: Once | INTRAMUSCULAR | Status: AC
  Administered 2020-12-27: 1000 ug via INTRAMUSCULAR

## 2020-12-27 NOTE — Progress Notes (Signed)
Per orders of Dr. Damita Dunnings, injection of monthly B12 1000 mcg/ml given by Pilar Grammes, CMA in Left Deltoid at pt's request. Patient tolerated injection well.

## 2020-12-28 ENCOUNTER — Telehealth: Payer: Self-pay | Admitting: Family Medicine

## 2020-12-28 NOTE — Telephone Encounter (Signed)
-----   Message from Tonia Ghent, MD sent at 12/28/2020 10:46 AM EST ----- Please set up a yearly visit for 2023, springtime would be reasonable.  Thanks.   Brigitte Pulse

## 2020-12-28 NOTE — Telephone Encounter (Signed)
Called patient and left vm to schedule CPE for 2023

## 2021-02-01 ENCOUNTER — Other Ambulatory Visit: Payer: Self-pay

## 2021-02-01 ENCOUNTER — Ambulatory Visit (INDEPENDENT_AMBULATORY_CARE_PROVIDER_SITE_OTHER): Payer: Medicare Other

## 2021-02-01 DIAGNOSIS — E538 Deficiency of other specified B group vitamins: Secondary | ICD-10-CM | POA: Diagnosis not present

## 2021-02-01 MED ORDER — CYANOCOBALAMIN 1000 MCG/ML IJ SOLN
1000.0000 ug | Freq: Once | INTRAMUSCULAR | Status: AC
  Administered 2021-02-01: 15:00:00 1000 ug via INTRAMUSCULAR

## 2021-02-01 NOTE — Progress Notes (Signed)
Per orders of Dr. Danise Mina in leu of Dr. Josefine Class absence, an injection of B12 was given by Ophelia Shoulder, CMA. Patient tolerated injection well.

## 2021-03-08 ENCOUNTER — Ambulatory Visit: Payer: Medicare Other

## 2021-03-17 ENCOUNTER — Encounter: Payer: Medicare Other | Admitting: Family Medicine

## 2021-03-24 ENCOUNTER — Other Ambulatory Visit: Payer: Self-pay

## 2021-03-24 ENCOUNTER — Encounter: Payer: Self-pay | Admitting: Family Medicine

## 2021-03-24 ENCOUNTER — Ambulatory Visit (INDEPENDENT_AMBULATORY_CARE_PROVIDER_SITE_OTHER): Payer: Medicare Other | Admitting: Family Medicine

## 2021-03-24 VITALS — BP 122/72 | HR 64 | Temp 97.6°F | Ht 61.0 in | Wt 191.0 lb

## 2021-03-24 DIAGNOSIS — F03B0 Unspecified dementia, moderate, without behavioral disturbance, psychotic disturbance, mood disturbance, and anxiety: Secondary | ICD-10-CM

## 2021-03-24 DIAGNOSIS — E538 Deficiency of other specified B group vitamins: Secondary | ICD-10-CM

## 2021-03-24 DIAGNOSIS — E785 Hyperlipidemia, unspecified: Secondary | ICD-10-CM

## 2021-03-24 DIAGNOSIS — Z0000 Encounter for general adult medical examination without abnormal findings: Secondary | ICD-10-CM | POA: Diagnosis not present

## 2021-03-24 DIAGNOSIS — E559 Vitamin D deficiency, unspecified: Secondary | ICD-10-CM

## 2021-03-24 DIAGNOSIS — Z23 Encounter for immunization: Secondary | ICD-10-CM

## 2021-03-24 DIAGNOSIS — Z1231 Encounter for screening mammogram for malignant neoplasm of breast: Secondary | ICD-10-CM

## 2021-03-24 DIAGNOSIS — Z7189 Other specified counseling: Secondary | ICD-10-CM

## 2021-03-24 DIAGNOSIS — Z780 Asymptomatic menopausal state: Secondary | ICD-10-CM

## 2021-03-24 DIAGNOSIS — R059 Cough, unspecified: Secondary | ICD-10-CM

## 2021-03-24 LAB — COMPREHENSIVE METABOLIC PANEL
ALT: 16 U/L (ref 0–35)
AST: 18 U/L (ref 0–37)
Albumin: 4 g/dL (ref 3.5–5.2)
Alkaline Phosphatase: 80 U/L (ref 39–117)
BUN: 13 mg/dL (ref 6–23)
CO2: 32 mEq/L (ref 19–32)
Calcium: 9.5 mg/dL (ref 8.4–10.5)
Chloride: 101 mEq/L (ref 96–112)
Creatinine, Ser: 0.73 mg/dL (ref 0.40–1.20)
GFR: 80.2 mL/min (ref >60.00)
Glucose, Bld: 94 mg/dL (ref 70–99)
Potassium: 4.2 mEq/L (ref 3.5–5.1)
Sodium: 139 mEq/L (ref 135–145)
Total Bilirubin: 1 mg/dL (ref 0.2–1.2)
Total Protein: 6.7 g/dL (ref 6.0–8.3)

## 2021-03-24 LAB — CBC WITH DIFFERENTIAL/PLATELET
Basophils Absolute: 0 10*3/uL (ref 0.0–0.1)
Basophils Relative: 0.7 % (ref 0.0–3.0)
Eosinophils Absolute: 0.3 10*3/uL (ref 0.0–0.7)
Eosinophils Relative: 4.6 % (ref 0.0–5.0)
HCT: 42.6 % (ref 36.0–46.0)
Hemoglobin: 14.3 g/dL (ref 12.0–15.0)
Lymphocytes Relative: 37.3 % (ref 12.0–46.0)
Lymphs Abs: 2.4 10*3/uL (ref 0.7–4.0)
MCHC: 33.5 g/dL (ref 30.0–36.0)
MCV: 91.8 fl (ref 78.0–100.0)
Monocytes Absolute: 0.5 10*3/uL (ref 0.1–1.0)
Monocytes Relative: 7.1 % (ref 3.0–12.0)
Neutro Abs: 3.2 10*3/uL (ref 1.4–7.7)
Neutrophils Relative %: 50.3 % (ref 43.0–77.0)
Platelets: 261 10*3/uL (ref 150.0–400.0)
RBC: 4.64 Mil/uL (ref 3.87–5.11)
RDW: 13.1 % (ref 11.5–15.5)
WBC: 6.4 10*3/uL (ref 4.0–10.5)

## 2021-03-24 LAB — LIPID PANEL
Cholesterol: 216 mg/dL — ABNORMAL HIGH (ref 0–200)
HDL: 50.1 mg/dL (ref >39.00)
LDL Cholesterol: 132 mg/dL — ABNORMAL HIGH (ref 0–99)
NonHDL: 166.07
Total CHOL/HDL Ratio: 4
Triglycerides: 170 mg/dL — ABNORMAL HIGH (ref 0.0–149.0)
VLDL: 34 mg/dL (ref 0.0–40.0)

## 2021-03-24 LAB — VITAMIN B12: Vitamin B-12: 481 pg/mL (ref 211–911)

## 2021-03-24 LAB — TSH: TSH: 6.74 u[IU]/mL — ABNORMAL HIGH (ref 0.35–5.50)

## 2021-03-24 LAB — VITAMIN D 25 HYDROXY (VIT D DEFICIENCY, FRACTURES): VITD: 49.91 ng/mL (ref 30.00–100.00)

## 2021-03-24 MED ORDER — CYANOCOBALAMIN 1000 MCG/ML IJ SOLN
1000.0000 ug | Freq: Once | INTRAMUSCULAR | Status: AC
  Administered 2021-03-24: 1000 ug via INTRAMUSCULAR

## 2021-03-24 NOTE — Patient Instructions (Signed)
Go to the lab on the way out.   If you have mychart we'll likely use that to update you.    Take care.  Glad to see you. I'll work on the bone density and mammogram appointment.

## 2021-03-24 NOTE — Progress Notes (Signed)
This visit occurred during the SARS-CoV-2 public health emergency.  Safety protocols were in place, including screening questions prior to the visit, additional usage of staff PPE, and extensive cleaning of exam room while observing appropriate contact time as indicated for disinfecting solutions.  I have personally reviewed the Medicare Annual Wellness questionnaire and have noted 1. The patient's medical and social history 2. Their use of alcohol, tobacco or illicit drugs 3. Their current medications and supplements 4. The patient's functional ability including ADL's, fall risks, home safety risks and hearing or visual             impairment. 5. Diet and physical activities 6. Evidence for depression or mood disorders  The patients weight, height, BMI have been recorded in the chart and visual acuity is per eye clinic.  I have made referrals, counseling and provided education to the patient based review of the above and I have provided the pt with a written personalized care plan for preventive services.  Provider list updated- see scanned forms.  Routine anticipatory guidance given to patient.  See health maintenance. The possibility exists that previously documented standard health maintenance information may have been brought forward from a previous encounter into this note.  If needed, that same information has been updated to reflect the current situation based on today's encounter.    Flu 2023 Shingles 2022 PNA prev done Tetanus 2016 Covid vaccine prev done.   Colonoscopy deferred.  Breast cancer screening ordered 2023 DXA ordered 2023 Advance directive- husband and daughter designated if patient were incapacitated.  Cognitive function addressed- see scanned forms- and if abnormal then additional documentation follows.   In addition to [facility], follow up visit for the below conditions:  Memory d/w pt.  Has seen neuro.  Still on donepezil and namenda. H/o B12 def.  Had  been getting [facility] prev.  Per neuro goal B12 was on the higher end of the range.  She is no longer preparing foods.  Safety discussed. Not driving.  She is walking frequently with her husband, up to a mile at a time.  Not wandering.    Still with dry cough at baseline.  Patient is putting up with it.  No fevers.  No miralax use or need in the last few months.  Having BMs 2-3 times per day.  Discussed options.  Would continue as is.  Here today with daughter, who lives in [place].  Living with husband and has caregivers at home.    PMH and SH reviewed  Meds, vitals, and allergies reviewed.   ROS: Per HPI.  Unless specifically indicated otherwise in HPI, the patient denies:  General: fever. Eyes: acute vision changes ENT: sore throat Cardiovascular: chest pain Respiratory: SOB GI: vomiting GU: dysuria Musculoskeletal: acute back pain Derm: acute rash Neuro: acute motor dysfunction Psych: worsening mood Endocrine: polydipsia Heme: bleeding Allergy: hayfever  GEN: nad, alert and pleasant in conversation but she answers with short responses.  She is not oriented to time/year but he is oriented to self. HEENT: ncat NECK: supple w/o LA CV: rrr. PULM: ctab, no inc wob ABD: soft, +bs EXT: no edema SKIN: Well-perfused.

## 2021-03-27 NOTE — Assessment & Plan Note (Signed)
Flu 2023 Shingles 2022 PNA prev done Tetanus 2016 Covid vaccine prev done.   Colonoscopy deferred.  Breast cancer screening ordered 2023 DXA ordered 2023 Advance directive- husband and daughter designated if patient were incapacitated.  Cognitive function addressed- see scanned forms- and if abnormal then additional documentation follows.

## 2021-03-27 NOTE — Assessment & Plan Note (Signed)
See notes on labs. 

## 2021-03-27 NOTE — Assessment & Plan Note (Signed)
Longstanding.  Lungs are clear.  Patient is following up cough.  I think will be more likely to cause problems but treating her cough aggressively, at least at this point.  Daughter agrees.

## 2021-03-27 NOTE — Assessment & Plan Note (Signed)
Continue donepezil and namenda.  See notes on labs.  Family supportive and monitoring/helping patient at home.  Continue as needed.  She has had neurology follow-up in the meantime.

## 2021-03-27 NOTE — Assessment & Plan Note (Signed)
Advance directive- husband and daughter designated if patient were incapacitated.

## 2021-03-30 ENCOUNTER — Other Ambulatory Visit: Payer: Self-pay | Admitting: Family Medicine

## 2021-03-30 DIAGNOSIS — E538 Deficiency of other specified B group vitamins: Secondary | ICD-10-CM

## 2021-03-30 MED ORDER — CYANOCOBALAMIN 1000 MCG/ML IJ SOLN: 1000.0000 ug | INTRAMUSCULAR | Status: DC

## 2021-04-03 ENCOUNTER — Ambulatory Visit: Payer: Medicare Other | Admitting: Dermatology

## 2021-04-03 ENCOUNTER — Other Ambulatory Visit: Payer: Self-pay

## 2021-04-03 DIAGNOSIS — D225 Melanocytic nevi of trunk: Secondary | ICD-10-CM

## 2021-04-03 DIAGNOSIS — Z1283 Encounter for screening for malignant neoplasm of skin: Secondary | ICD-10-CM | POA: Diagnosis not present

## 2021-04-03 DIAGNOSIS — D1800 Hemangioma unspecified site: Secondary | ICD-10-CM | POA: Diagnosis not present

## 2021-04-03 DIAGNOSIS — L814 Other melanin hyperpigmentation: Secondary | ICD-10-CM

## 2021-04-03 DIAGNOSIS — L304 Erythema intertrigo: Secondary | ICD-10-CM

## 2021-04-03 DIAGNOSIS — L853 Xerosis cutis: Secondary | ICD-10-CM

## 2021-04-03 DIAGNOSIS — L858 Other specified epidermal thickening: Secondary | ICD-10-CM | POA: Diagnosis not present

## 2021-04-03 DIAGNOSIS — L578 Other skin changes due to chronic exposure to nonionizing radiation: Secondary | ICD-10-CM

## 2021-04-03 DIAGNOSIS — L738 Other specified follicular disorders: Secondary | ICD-10-CM | POA: Diagnosis not present

## 2021-04-03 DIAGNOSIS — L299 Pruritus, unspecified: Secondary | ICD-10-CM

## 2021-04-03 DIAGNOSIS — D229 Melanocytic nevi, unspecified: Secondary | ICD-10-CM | POA: Diagnosis not present

## 2021-04-03 DIAGNOSIS — L821 Other seborrheic keratosis: Secondary | ICD-10-CM | POA: Diagnosis not present

## 2021-04-03 MED ORDER — HYDROCORTISONE 2.5 % EX CREA: TOPICAL_CREAM | CUTANEOUS | 3 refills | Status: DC

## 2021-04-03 NOTE — Patient Instructions (Addendum)
Dry Skin Care  What causes dry skin?  Dry skin is common and results from inadequate moisture in the outer skin layers. Dry skin usually results from the excessive loss of moisture from the skin surface. This occurs due to two major factors: Normally the skin's oil glands deposit a layer of oil on the skin's surface. This layer of oil prevents the loss of moisture from the skin. Exposure to soaps, cleaners, solvents, and disinfectants removes this oily film, allowing water to escape. Water loss from the skin increases when the humidity is low. During winter months we spend a lot of time indoors where the air is heated. Heated air has very low humidity. This also contributes to dry skin.  A tendency for dry skin may accompany such disorders as eczema. Also, as people age, the number of functioning oil glands decreases, and the tendency toward dry skin can be a sensation of skin tightness when emerging from the shower.  How do I manage dry skin?  Humidify your environment. This can be accomplished by using a humidifier in your bedroom at night during winter months. Bathing can actually put moisture back into your skin if done right. Take the following steps while bathing to sooth dry skin: Avoid hot water, which only dries the skin and makes itching worse. Use warm water. Avoid washcloths or extensive rubbing or scrubbing. Use mild soaps like unscented Dove, Oil of Olay, Cetaphil, Basis, or CeraVe. If you take baths rather than showers, rinse off soap residue with clean water before getting out of tub. Once out of the shower/tub, pat dry gently with a soft towel. Leave your skin damp. While still damp, apply any medicated ointment/cream you were prescribed to the affected areas. After you apply your medicated ointment/cream, then apply your moisturizer to your whole body.This is the most important step in dry skin care. If this is omitted, your skin will continue to be dry. The choice of  moisturizer is also very important. In general, lotion will not provider enough moisture to severely dry skin because it is water based. You should use an ointment or cream. Moisturizers should also be unscented. Good choices include Vaseline (plain petrolatum), Aquaphor, Cetaphil, CeraVe, Vanicream, DML Forte, Aveeno moisture, or Eucerin Cream. Bath oils can be helpful, but do not replace the application of moisturizer after the bath. In addition, they make the tub slippery causing an increased risk for falls. Therefore, we do not recommend their use.   If You Need Anything After Your Visit  If you have any questions or concerns for your doctor, please call our main line at [phone] and press option 4 to reach your doctor's medical assistant. If no one answers, please leave a voicemail as directed and we will return your call as soon as possible. Messages left after 4 pm will be answered the following business day.   You may also send [place] a message via [place]. We typically respond to MyChart messages within 1-2 business days.  For prescription refills, please ask your pharmacy to contact our office. Our fax number is [phone].  If you have an urgent issue when the clinic is closed that cannot wait until the next business day, you can page your doctor at the number below.    Please note that while we do our best to be available for urgent issues outside of office hours, we are not available 24/7.   If you have an urgent issue and are unable to reach [place], you may  choose to seek medical care at your doctor's office, retail clinic, urgent care center, or emergency room.  If you have a medical emergency, please immediately call 911 or go to the emergency department.  Pager Numbers  - Dr. Nehemiah Massed: [phone]  - Dr. Laurence Ferrari: [phone]  - Dr. Nicole Kindred: [phone]  In the event of inclement weather, please call our main line at [phone] for an update on the status of any delays or  closures.  Dermatology Medication Tips: Please keep the boxes that topical medications come in in order to help keep track of the instructions about where and how to use these. Pharmacies typically print the medication instructions only on the boxes and not directly on the medication tubes.   If your medication is too expensive, please contact our office at [phone] option 4 or send [place] a message through [place].   We are unable to tell what your co-pay for medications will be in advance as this is different depending on your insurance coverage. However, we may be able to find a substitute medication at lower cost or fill out paperwork to get insurance to cover a needed medication.   If a prior authorization is required to get your medication covered by your insurance company, please allow [place] 1-2 business days to complete this process.  Drug prices often vary depending on where the prescription is filled and some pharmacies may offer cheaper prices.  The website www.goodrx.com contains coupons for medications through different pharmacies. The prices here do not account for what the cost may be with help from insurance (it may be cheaper with your insurance), but the website can give you the price if you did not use any insurance.  - You can print the associated coupon and take it with your prescription to the pharmacy.  - You may also stop by our office during regular business hours and pick up a GoodRx coupon card.  - If you need your prescription sent electronically to a different pharmacy, notify our office through [facility] or by phone at [phone] option 4.     Si Usted Necesita Algo Despus de Su Visita  Tambin puede enviarnos un mensaje a travs de Pharmacist, community. Por lo general respondemos a los mensajes de MyChart en el transcurso de 1 a 2 das hbiles.  Para renovar recetas, por favor pida a su farmacia que se ponga en contacto con nuestra oficina. Harland Dingwall de fax  es [place] [phone].  Si tiene un asunto urgente cuando la clnica est cerrada y que no puede esperar hasta el siguiente da hbil, puede llamar/localizar a su doctor(a) al nmero que aparece a continuacin.   Por favor, tenga en cuenta que aunque hacemos todo lo posible para estar disponibles para asuntos urgentes fuera del horario de [place], no estamos disponibles las 24 horas del da, los 7 das de la [place].   Si tiene un problema urgente y no puede comunicarse con nosotros, puede optar por buscar atencin mdica  en el consultorio de su doctor(a), en una clnica privada, en un centro de atencin urgente o en una sala de emergencias.  Si tiene Engineering geologist, por favor llame inmediatamente al 911 o vaya a la sala de emergencias.  Nmeros de bper  - Dr. Nehemiah Massed: [phone]  - Dra. Moye: [phone]  - Dra. Nicole Kindred: [phone]  En caso de inclemencias del [place], por favor llame a Johnsie Kindred principal al [phone] para una actualizacin sobre el [place] de cualquier retraso o cierre.  Consejos para la medicacin en dermatologa: Por favor, guarde las cajas en las que vienen los medicamentos de uso tpico para ayudarle a seguir las instrucciones sobre dnde y cmo usarlos. Las farmacias generalmente imprimen las instrucciones del medicamento slo en las cajas y no directamente en los tubos del [place].   Si su medicamento es muy caro, por favor, pngase en contacto con Zigmund Daniel llamando al [phone] y presione la opcin 4 o envenos un mensaje a travs de Pharmacist, community.   No podemos decirle cul ser su copago por los medicamentos por adelantado ya que esto es diferente dependiendo de la cobertura de su seguro. Sin embargo, es posible que podamos encontrar un medicamento sustituto a Electrical engineer un formulario para que el seguro cubra el medicamento que se considera necesario.   Si se requiere una autorizacin previa para que su compaa de seguros [place]  su medicamento, por favor permtanos de 1 a 2 das hbiles para completar este proceso.  Los precios de los medicamentos varan con frecuencia dependiendo del Environmental consultant de dnde se surte la receta y alguna farmacias pueden ofrecer precios ms baratos.  El sitio web www.goodrx.com tiene cupones para medicamentos de Airline pilot. Los precios aqu no tienen en cuenta lo que podra costar con la ayuda del seguro (puede ser ms barato con su seguro), pero el sitio web puede darle el precio si no utiliz Research scientist (physical sciences).  - Puede imprimir el cupn correspondiente y llevarlo con su receta a la farmacia.  - Tambin puede pasar por nuestra oficina durante el horario de atencin regular y Charity fundraiser una tarjeta de cupones de GoodRx.  - Si necesita que su receta se enve electrnicamente a una farmacia diferente, informe a nuestra oficina a travs de MyChart de [place] o por telfono llamando al [phone] y presione la opcin 4.

## 2021-04-03 NOTE — Progress Notes (Signed)
Follow-Up Visit   Subjective  Alice Smith is a 76 y.o. female who presents for the following: Annual Exam.  The patient presents for Upper-Body Skin Exam (UBSE) for skin cancer screening and mole check.  The patient has spots, moles and lesions to be evaluated, some may be new or changing. No history of skin cancer.   Patient accompanied by husband.  She has dementia.  The following portions of the chart were reviewed this encounter and updated as appropriate:       Review of Systems:  No other skin or systemic complaints except as noted in HPI or Assessment and Plan.  Objective  Well appearing patient in no apparent distress; mood and affect are within normal limits.  All skin waist up examined.  Left Lateral Lower Back 6.63mm med brown macule    Posterior neck Pink patch in fold  arms Dry scaly patches with excoriations on the arms    Assessment & Plan  Skin cancer screening performed today.  Actinic Damage - chronic, secondary to cumulative UV radiation exposure/sun exposure over time - diffuse scaly erythematous macules with underlying dyspigmentation - Recommend daily broad spectrum sunscreen SPF 30+ to sun-exposed areas, reapply every 2 hours as needed.  - Recommend staying in the shade or wearing long sleeves, sun glasses (UVA+UVB protection) and wide brim hats (4-inch brim around the entire circumference of the hat). - Call for new or changing lesions.  Hemangiomas - Red papules - Discussed benign nature - Observe - Call for any changes  Melanocytic Nevi - Tan-brown and/or pink-flesh-colored symmetric macules and papules - Benign appearing on exam today - Observation - Call clinic for new or changing moles - Recommend daily use of broad spectrum spf 30+ sunscreen to sun-exposed areas.   Lentigines - Scattered tan macules, including upper lip - Due to sun exposure - Benign-appering, observe - Recommend daily broad spectrum sunscreen SPF 30+ to  sun-exposed areas, reapply every 2 hours as needed. - Call for any changes  Seborrheic Keratoses - Stuck-on, waxy, tan-brown papules and/or plaques  - Benign-appearing - Discussed benign etiology and prognosis. - Observe - Call for any changes  Keratosis Pilaris - Tiny follicular keratotic papules of the back, upper arms - Benign. Genetic in nature. No cure. - Observe. - If desired, patient can use an emollient (moisturizer) containing ammonium lactate, urea or salicylic acid once a day to smooth the area  Sebaceous Hyperplasia - Small yellow papules with a central dell - Benign - Observe  Nevus Left Lateral Lower Back  Benign-appearing.  Stable. Observation.  Call clinic for new or changing moles.  Recommend daily use of broad spectrum spf 30+ sunscreen to sun-exposed areas.   Intertrigo Posterior neck  vs Dermatitis, currently flared  Start hydrocortisone cream 2.5% apply QD/BID AA neck until improved dsp 30g 3Rf.  Intertrigo is a chronic recurrent rash that occurs in skin fold areas that may be associated with friction; heat; moisture; yeast; fungus; and bacteria.  It is exacerbated by increased movement / activity; sweating; and higher atmospheric temperature.  hydrocortisone 2.5 % cream - Posterior neck Apply to rash on neck and arms once to twice daily until improved.  Xerosis cutis arms  With pruritus  Start 2.5% hydrocortisone cream Apply qd/bid prn itch.  Recommend mild soap and moisturizing cream 1-2 times daily.  Gentle skin care handout provided. Samples of Eucerin, Cetaphil Cream, and Aveeno.    Return in about 1 year (around 04/03/2022) for TBSE.  Murray Hodgkins  Bobby Rumpf, CMA, am acting as scribe for Brendolyn Patty, MD . Documentation: I have reviewed the above documentation for accuracy and completeness, and I agree with the above.  Brendolyn Patty MD

## 2021-04-12 ENCOUNTER — Telehealth: Payer: Self-pay | Admitting: Family Medicine

## 2021-04-12 ENCOUNTER — Other Ambulatory Visit: Payer: Self-pay

## 2021-04-12 ENCOUNTER — Ambulatory Visit (INDEPENDENT_AMBULATORY_CARE_PROVIDER_SITE_OTHER): Payer: Medicare Other

## 2021-04-12 DIAGNOSIS — E538 Deficiency of other specified B group vitamins: Secondary | ICD-10-CM | POA: Diagnosis not present

## 2021-04-12 MED ORDER — CYANOCOBALAMIN 1000 MCG/ML IJ SOLN
1000.0000 ug | Freq: Once | INTRAMUSCULAR | Status: AC
  Administered 2021-04-12: 1000 ug via INTRAMUSCULAR

## 2021-04-12 NOTE — Progress Notes (Signed)
Patient presented for B 12 injection given by Nikkie Liming, CMA to right deltoid, patient voiced no concerns nor showed any signs of distress during injection.  

## 2021-04-26 ENCOUNTER — Ambulatory Visit: Payer: Medicare Other

## 2021-04-26 ENCOUNTER — Ambulatory Visit (INDEPENDENT_AMBULATORY_CARE_PROVIDER_SITE_OTHER): Payer: Medicare Other

## 2021-04-26 ENCOUNTER — Other Ambulatory Visit: Payer: Self-pay

## 2021-04-26 DIAGNOSIS — E538 Deficiency of other specified B group vitamins: Secondary | ICD-10-CM

## 2021-04-26 MED ORDER — CYANOCOBALAMIN 1000 MCG/ML IJ SOLN
1000.0000 ug | Freq: Once | INTRAMUSCULAR | Status: AC
  Administered 2021-04-26: 1000 ug via INTRAMUSCULAR

## 2021-04-26 NOTE — Progress Notes (Signed)
Per orders of Dr. Lorelei Pont, in Dr. Josefine Class absence, injection of B12 given by Loreen Freud. ?Patient tolerated injection well.  ?

## 2021-05-16 ENCOUNTER — Encounter: Payer: Self-pay | Admitting: Nurse Practitioner

## 2021-05-16 ENCOUNTER — Other Ambulatory Visit: Payer: Self-pay

## 2021-05-16 ENCOUNTER — Ambulatory Visit: Payer: Medicare Other | Admitting: Nurse Practitioner

## 2021-05-16 ENCOUNTER — Telehealth: Payer: Self-pay | Admitting: Family Medicine

## 2021-05-16 ENCOUNTER — Ambulatory Visit (INDEPENDENT_AMBULATORY_CARE_PROVIDER_SITE_OTHER)
Admission: RE | Admit: 2021-05-16 | Discharge: 2021-05-16 | Disposition: A | Discharge status: Home / Self Care | Payer: Medicare Other | Source: Ambulatory Visit | Attending: Nurse Practitioner | Admitting: Nurse Practitioner

## 2021-05-16 VITALS — BP 128/76 | HR 64 | Temp 96.4°F | Resp 14 | Ht 61.0 in | Wt 193.5 lb

## 2021-05-16 DIAGNOSIS — R159 Full incontinence of feces: Secondary | ICD-10-CM

## 2021-05-16 DIAGNOSIS — Z9181 History of falling: Secondary | ICD-10-CM

## 2021-05-16 DIAGNOSIS — R0982 Postnasal drip: Secondary | ICD-10-CM | POA: Diagnosis not present

## 2021-05-16 DIAGNOSIS — R350 Frequency of micturition: Secondary | ICD-10-CM | POA: Diagnosis not present

## 2021-05-16 DIAGNOSIS — R053 Chronic cough: Secondary | ICD-10-CM

## 2021-05-16 DIAGNOSIS — W19XXXA Unspecified fall, initial encounter: Secondary | ICD-10-CM | POA: Insufficient documentation

## 2021-05-16 DIAGNOSIS — R059 Cough, unspecified: Secondary | ICD-10-CM | POA: Diagnosis not present

## 2021-05-16 LAB — COMPREHENSIVE METABOLIC PANEL
ALT: 19 U/L (ref 0–35)
AST: 23 U/L (ref 0–37)
Albumin: 4 g/dL (ref 3.5–5.2)
Alkaline Phosphatase: 95 U/L (ref 39–117)
BUN: 13 mg/dL (ref 6–23)
CO2: 27 mEq/L (ref 19–32)
Calcium: 9.2 mg/dL (ref 8.4–10.5)
Chloride: 104 mEq/L (ref 96–112)
Creatinine, Ser: 0.84 mg/dL (ref 0.40–1.20)
GFR: 67.7 mL/min (ref >60.00)
Glucose, Bld: 92 mg/dL (ref 70–99)
Potassium: 3.8 mEq/L (ref 3.5–5.1)
Sodium: 140 mEq/L (ref 135–145)
Total Bilirubin: 0.8 mg/dL (ref 0.2–1.2)
Total Protein: 6.7 g/dL (ref 6.0–8.3)

## 2021-05-16 LAB — CBC
HCT: 42.8 % (ref 36.0–46.0)
Hemoglobin: 14.4 g/dL (ref 12.0–15.0)
MCHC: 33.7 g/dL (ref 30.0–36.0)
MCV: 92.3 fl (ref 78.0–100.0)
Platelets: 205 10*3/uL (ref 150.0–400.0)
RBC: 4.63 Mil/uL (ref 3.87–5.11)
RDW: 13.1 % (ref 11.5–15.5)
WBC: 5.7 10*3/uL (ref 4.0–10.5)

## 2021-05-16 MED ORDER — HYDROCODONE BIT-HOMATROP MBR 5-1.5 MG/5ML PO SOLN
5.0000 mL | Freq: Every evening | ORAL | 0 refills | Status: AC | PRN
Start: 2021-05-16 — End: 2021-05-26

## 2021-05-16 NOTE — Telephone Encounter (Signed)
Tried to call patient and no answer. Patient must be on her way to the office for her appointment. ?

## 2021-05-16 NOTE — Progress Notes (Signed)
? ?Acute Office Visit ? ?Subjective:  ? ? Patient ID: Alice Smith, female    DOB: Apr 25, 1945, 76 y.o.   MRN: [id number] ? ?Chief Complaint  ?Patient presents with  ? Fall  ?  Fell on 05/15/21, patient was coming down her stool steps from the bed and just slipped off the bottom stair and fell. EMT checked her out then. Did not hit her head. Also, bowel incontinence was worse yesterday and today but is a chronic issue. Patient was also coughing a lot last night.   ? ? ? ?Patient is in today for Fall ? ?States that approx 1 year ago she fell out of the bed because it is a hgher bed. States that he has a railing and will open the railing and help her get out of bed. Had footies on and when she stepped on the stool she slipped.   ?States that she slipped and her bottom hit the steps yesterday. He was unable to lift her up and called the fire department. Complaining of no pain and no soreness. No loc or hit head. ? ?Cough: has been dealing with it for some time. States that it has been increased for some time per patients spouse. States he thinks it is from excitement and habit. No trouble swallowing or eating. Has tried mucinex DM and claratin. ? ?BM: Bowel incontinence. States that it has been going on for an extended period of time and is more sevre in regards to volume. Has not been evaluated by GI per patient and spouse report  ? ?Past Medical History:  ?Diagnosis Date  ? Arthritis   ? Breast mass 05/2001  ? benign  ? Complication of anesthesia   ? slow to wake up x 1  ? Concussion 2014, 05/2016  ? Dry cough   ? at night  ? Hemorrhoids   ? Migraine   ? evaluated with Dr. Domingo Cocking 11/2009  ? Osteoporosis   ? ? ?Past Surgical History:  ?Procedure Laterality Date  ? ABDOMINAL EXPLORATION SURGERY    ? for twisted bowel  ? ABDOMINAL HYSTERECTOMY    ? yrs ago  ? BREAST SURGERY  1989  ? aspirated lump left breast  ? CARPAL TUNNEL RELEASE    ? right 03/2001, left 07/2001  ? ESOPHAGOGASTRODUODENOSCOPY    ? KNEE ARTHROSCOPY    ?  rt knee  ? nissenfundiplication    ? PARTIAL HYSTERECTOMY  1989  ? dyspasia, ovaries left in  ? PARTIAL KNEE ARTHROPLASTY Right 06/23/2013  ? Procedure: RIGHT KNEE MEDIAL UNICOMPARTMENTAL ARTHROPLASTY;  Surgeon: Mauri Pole, MD;  Location: [facility];  Service: Orthopedics;  Laterality: Right;  ? PARTIAL KNEE ARTHROPLASTY Left 08/31/2013  ? Procedure: LEFT UNICOMPARTMENTAL KNEE ATHROPLASTY   (MEDIAL) ;  Surgeon: Mauri Pole, MD;  Location: [facility];  Service: Orthopedics;  Laterality: Left;  ? ROTATOR CUFF REPAIR    ? right 03/2001, left 07/2001  ? ? ?Family History  ?Problem Relation Age of Onset  ? Arthritis Mother   ? Cancer Mother   ?     lung cancer, dx'd in her 20s  ? Cancer Father   ?     throat  ? COPD Brother   ? Cancer Sister   ?     breast  ? Breast cancer Sister   ? Breast cancer Sister   ? Colon cancer Neg Hx   ? ? ?Social History  ? ?Socioeconomic History  ? Marital status: Married  ?  Spouse name: Not on file  ? Number of children: 3  ? Years of education: Not on file  ? Highest education level: Not on file  ?Occupational History  ? Occupation: home  ?Tobacco Use  ? Smoking status: Former  ?  Packs/day: 1.00  ?  Years: 5.00  ?  Pack years: 5.00  ?  Types: Cigarettes  ?  Quit date: 06/17/1978  ?  Years since quitting: 42.9  ? Smokeless tobacco: Never  ? Tobacco comments:  ?  5 pack year history  ?Substance and Sexual Activity  ? Alcohol use: No  ? Drug use: No  ? Sexual activity: Not on file  ?Other Topics Concern  ? Not on file  ?Social History Narrative  ? Second marriage, 1980. Has 3 adult children, adopted 4, 6 grandkids.  ? From [place], [place]  ? Education- GED, some college  ? ?Social Determinants of Health  ? ?Financial Resource Strain: Not on file  ?Food Insecurity: Not on file  ?Transportation Needs: Not on file  ?Physical Activity: Not on file  ?Stress: Not on file  ?Social Connections: Not on file  ?Intimate Partner Violence: Not on file  ? ? ?Outpatient Medications Prior to Visit  ?Medication Sig  Dispense Refill  ? Bacillus Coagulans-Inulin (PROBIOTIC) 1-250 BILLION-MG CAPS Take by mouth daily.    ? calcium-vitamin D (OSCAL WITH D) 500-200 MG-UNIT tablet Take 1 tablet by mouth daily.    ? Cholecalciferol (DIALYVITE VITAMIN D 5000) 125 MCG (5000 UT) capsule Take 1 capsule (5,000 Units total) by mouth daily.    ? cyanocobalamin (,VITAMIN B-12,) 1000 MCG/ML injection Inject 1 mL (1,000 mcg total) into the muscle every 14 (fourteen) days.    ? donepezil (ARICEPT) 10 MG tablet Take 1 tablet (10 mg total) by mouth at bedtime. 90 tablet 3  ? famotidine (PEPCID) 20 MG tablet Take 1 tablet (20 mg total) by mouth 2 (two) times daily. 180 tablet 1  ? hydrocortisone 2.5 % cream Apply to rash on neck and arms once to twice daily until improved. 30 g 3  ? loratadine (CLARITIN) 10 MG tablet Take 10 mg by mouth daily.    ? memantine (NAMENDA) 10 MG tablet Take 1 tablet (10 mg total) by mouth 2 (two) times daily. 180 tablet 3  ? ?No facility-administered medications prior to visit.  ? ? ?Allergies  ?Allergen Reactions  ? Clarithromycin   ?  REACTION: nausea and vomiting  ? Erythromycin Ethylsuccinate   ?  REACTION: GI upset  ? Fluticasone-Salmeterol   ?  REACTION: Sore tongue and rash  ? Procaine Hcl   ?  REACTION: swelling  ? Sulfonamide Derivatives   ?  REACTION: rash  ? Topiramate   ?  REACTION: confusion  ? ? ?Review of Systems  ?Constitutional:  Negative for chills, fatigue and fever.  ?HENT:  Negative for congestion, ear discharge, ear pain, sinus pressure, sinus pain and sore throat.   ?Respiratory:  Positive for cough and shortness of breath (with cough).   ?     Sleeps on two pillows  ?Cardiovascular:  Negative for chest pain.  ?Gastrointestinal:  Negative for diarrhea, nausea and vomiting.  ?     BM incontin. Formed not diarrhea   ?Genitourinary:  Positive for frequency. Negative for difficulty urinating, dysuria and hematuria.  ?     Incont with urine  ?Neurological:  Negative for headaches.  ? ?   ?Objective:  ?   ?Physical Exam ?Vitals and nursing note  reviewed.  ?Constitutional:   ?   Appearance: Normal appearance.  ?HENT:  ?   Right Ear: Tympanic membrane, ear canal and external ear normal.  ?   Left Ear: Tympanic membrane, ear canal and external ear normal.  ?   Mouth/Throat:  ?   Mouth: Mucous membranes are moist.  ?   Pharynx: Oropharynx is clear. No posterior oropharyngeal erythema.  ?Cardiovascular:  ?   Rate and Rhythm: Normal rate and regular rhythm.  ?   Heart sounds: Normal heart sounds.  ?Pulmonary:  ?   Effort: Pulmonary effort is normal.  ?   Breath sounds: Normal breath sounds.  ?Abdominal:  ?   General: Bowel sounds are normal. There is no distension.  ?   Palpations: There is no mass.  ?   Tenderness: There is no abdominal tenderness. There is no right CVA tenderness or left CVA tenderness.  ?   Hernia: No hernia is present.  ?Musculoskeletal:  ?   Thoracic back: No tenderness or bony tenderness.  ?   Lumbar back: No tenderness or bony tenderness. Negative right straight leg raise test and negative left straight leg raise test.  ?Lymphadenopathy:  ?   Cervical: No cervical adenopathy.  ?Skin: ?   General: Skin is warm.  ?Neurological:  ?   Mental Status: She is alert.  ?   Deep Tendon Reflexes:  ?   Reflex Scores: ?     Patellar reflexes are 2+ on the right side and 2+ on the left side. ?   Comments: Bilateral lower extremity strength 5/5 per what patient was able to perform  ? ? ?BP 128/76   Pulse 64   Temp (!) 96.4 ?F (35.8 ?C)   Resp 14   Ht '5\' 1"'$  (1.549 m)   Wt 193 lb 8 oz (87.8 kg)   SpO2 98%   BMI 36.56 kg/m?  ?Wt Readings from Last 3 Encounters:  ?05/16/21 193 lb 8 oz (87.8 kg)  ?03/24/21 191 lb (86.6 kg)  ?11/17/20 193 lb 8 oz (87.8 kg)  ? ? ?Health Maintenance Due  ?Topic Date Due  ? COVID-19 Vaccine (5 - Booster for Pfizer series) 11/07/2020  ? ? ?There are no preventive care reminders to display for this patient. ? ? ?Lab Results  ?Component Value Date  ? TSH 6.74 (H) 03/24/2021  ? ?Lab  Results  ?Component Value Date  ? WBC 6.4 03/24/2021  ? HGB 14.3 03/24/2021  ? HCT 42.6 03/24/2021  ? MCV 91.8 03/24/2021  ? PLT 261.0 03/24/2021  ? ?Lab Results  ?Component Value Date  ? NA 139 03/24/2021

## 2021-05-16 NOTE — Telephone Encounter (Signed)
Patient's daughter called, her mom fell on 3.28, fire department called out because her husband could not get her back on the bed ? ?Fire department checked her out, she is walking fine this morning but is having bowel accidents where she is needing a pull-up consistently ? ?She is also coughing a bunch, coughed consistently through the night, husband thinks she may need something stronger for cough, takes claritin daily ? ?Daughter wants chart thoroughly reviewed, patient has med allergies, and also doesn't want the patient to have anything that would further upset stomach ? ?

## 2021-05-16 NOTE — Assessment & Plan Note (Signed)
Can use her office this could be indicative of a postnasal drip. ?The cough has been increasingly as of late we will start the patient on Flonase 2 sprays each nostril daily.  Did give fluticasone precautions in regards to inciting epistaxis.  Also pending chest x-ray ?

## 2021-05-16 NOTE — Telephone Encounter (Signed)
Noted. Thanks.

## 2021-05-16 NOTE — Telephone Encounter (Signed)
Called in hycodan for patient to use QHS prn. Can continue using the mucinex through the day. Called Alice Smith and went over this information with him ?

## 2021-05-16 NOTE — Assessment & Plan Note (Signed)
This is been going on for extended period of time states as of late has increased in regards to volume.  Patient is wearing briefs at baseline.  Per the report has not been evaluated by GI.  Patient has no complaints of abdominal pain.  We will check basic labs today. ?

## 2021-05-16 NOTE — Assessment & Plan Note (Signed)
Patient is a poor historian but she did endorse urinary frequency.  Patient is incontinent in regards she does not alert folks when she is going to bathroom.  We did attempt to collect a urine twice in office to make sure she did not have underlying urinary tract infection.  Did have a joint discussion with patient and patient's husband about doing a eval urinary cath in office.  Patient has been wanted to defer for time being and see what other test results are. ?

## 2021-05-16 NOTE — Addendum Note (Signed)
Addended by: Michela Pitcher on: 05/16/2021 01:26 PM ? ? Modules accepted: Orders ? ?

## 2021-05-16 NOTE — Assessment & Plan Note (Signed)
Patient's cough has increased as of late.  Clearing her throat office, suspect postnasal drip.  We will send in Flonase 2 sprays daily.  Patient continue her over-the-counter antihistamine.  Also obtain a chest x-ray, pending results.  Patient was started on Mucinex DM she may continue this medication also if beneficial ?

## 2021-05-16 NOTE — Telephone Encounter (Signed)
Patient has an appointment scheduled this morning 05/16/21 at 10:00 am with Romilda Garret NP. ?

## 2021-05-16 NOTE — Assessment & Plan Note (Signed)
Patient had a slip and fall at home.  She does have advancing dementia and requires help to get out of the bed in the morning.  Patient spouse was there states that she put her foot up on a stepstool in her socks slipped landing on her bottom.  Fire fighter's were called to the house and EMT check vital signs and gave her a look over and stated she looked okay.  Patient has no complaints today in office exam completely benign in regards to musculoskeletal discomfort or abnormalities.  Patient did not hit her head nor lose consciousness patient is not on a blood thinner ?

## 2021-05-16 NOTE — Patient Instructions (Signed)
Nice to see you today ?Lets try some Flonase (nasal Spray) it can cause nose bleeds with extended use. If this happens stop using it. Continue taking the clarain. If you decide you want a cough suppressant let me know ?Follow if symptoms worsen or do not improve. ?I will be in touch with labs and xray  ?

## 2021-05-16 NOTE — Telephone Encounter (Signed)
Per Romilda Garret please triage ?

## 2021-05-16 NOTE — Telephone Encounter (Signed)
PLEASE NOTE: All timestamps contained within this report are represented as [place] Standard Time. ?CONFIDENTIALTY NOTICE: This fax transmission is intended only for the addressee. It contains information that is legally privileged, confidential or otherwise protected from ?use or disclosure. If you are not the intended recipient, you are strictly prohibited from reviewing, disclosing, copying using or disseminating any of this information or taking any ?action in reliance on or regarding this information. If you have received this fax in error, please notify us immediately by telephone so that we can arrange for its return to [place]. ?Phone: [phone], Toll-Free: [phone], Fax: [phone] ?Page: 1 of 1 ?Call Id: [id number] ?[facility] - Client ?Nonclinical Telephone Record  ?[facility]? ?Client [facility] Night - Client ?Client Site [place] ?Provider Renford Dills - MD ?Contact Type Call ?Who Is Calling Patient / Member / Family / Caregiver ?Caller Name Joanna Puff ?Caller Phone Number [phone] ?Patient Name Alice Smith ?Patient DOB 10-26-1945 ?Call Type Message Only Information Provided ?Reason for Call Request to Schedule Office Appointment ?Initial Comment Caller states that she needs to schedule an appointment for her mother. ?Patient request to speak to RN No ?Additional Comment Caller declined triage and just asked that someone call them back so that they can schedule an ?appointment for her mother to be seen. ?Disp. Time Disposition Final User ?05/16/2021 7:19:02 AM General Information Provided Yes Wynema Birch ?Call Closed By: Wynema Birch ?Transaction Date/Time: 05/16/2021 7:16:25 AM (ET) ?

## 2021-05-16 NOTE — Telephone Encounter (Signed)
Patient's daughter called, would like to get a cough suppressant called in for her mom ? ?[id number] [place] [place]:  [phone] ?Fax:  [phone] ?  ? ?Whatever is  prescribes, daughter is requesting clear instructions for her father, for example: does she stop the mucinex, or mucinex during day and cough suppressant at night?  ? ?Please call Mr. BByrd Hesselbachwhen medicine called in so he knows to go get it at [phone]?

## 2021-05-17 ENCOUNTER — Encounter: Payer: Self-pay | Admitting: Nurse Practitioner

## 2021-05-17 ENCOUNTER — Encounter: Payer: Self-pay | Admitting: Family Medicine

## 2021-05-17 ENCOUNTER — Ambulatory Visit: Payer: Medicare Other | Admitting: Family Medicine

## 2021-05-17 ENCOUNTER — Ambulatory Visit (INDEPENDENT_AMBULATORY_CARE_PROVIDER_SITE_OTHER): Payer: Medicare Other

## 2021-05-17 ENCOUNTER — Other Ambulatory Visit: Payer: Self-pay | Admitting: Nurse Practitioner

## 2021-05-17 ENCOUNTER — Telehealth: Payer: Self-pay | Admitting: Family Medicine

## 2021-05-17 VITALS — BP 156/84 | HR 77 | Ht 61.0 in | Wt 195.5 lb

## 2021-05-17 DIAGNOSIS — E538 Deficiency of other specified B group vitamins: Secondary | ICD-10-CM | POA: Diagnosis not present

## 2021-05-17 DIAGNOSIS — F03B0 Unspecified dementia, moderate, without behavioral disturbance, psychotic disturbance, mood disturbance, and anxiety: Secondary | ICD-10-CM | POA: Diagnosis not present

## 2021-05-17 DIAGNOSIS — R059 Cough, unspecified: Secondary | ICD-10-CM

## 2021-05-17 DIAGNOSIS — J069 Acute upper respiratory infection, unspecified: Secondary | ICD-10-CM

## 2021-05-17 DIAGNOSIS — R062 Wheezing: Secondary | ICD-10-CM

## 2021-05-17 MED ORDER — CETIRIZINE HCL 10 MG PO TABS: 10.0000 mg | ORAL_TABLET | Freq: Every day | ORAL | 0 refills | Status: DC

## 2021-05-17 MED ORDER — CYANOCOBALAMIN 1000 MCG/ML IJ SOLN
1000.0000 ug | Freq: Once | INTRAMUSCULAR | Status: AC
  Administered 2021-05-17: 1000 ug via INTRAMUSCULAR

## 2021-05-17 MED ORDER — PREDNISONE 20 MG PO TABS
ORAL_TABLET | ORAL | 0 refills | Status: AC
Start: 2021-05-17 — End: 2021-05-23

## 2021-05-17 MED ORDER — DOXYCYCLINE HYCLATE 100 MG PO TABS: 100.0000 mg | ORAL_TABLET | Freq: Two times a day (BID) | ORAL | 0 refills | Status: AC

## 2021-05-17 NOTE — Progress Notes (Signed)
Patient was seen by neurology today and noted to have wheezing on exam. She was scheduled for a nurse visit and I went into listen to her. She did have wheezing. I saw her yesterday in office. Patient, daughter, and husband was with her. We discussed starting medications inclusive of switching her antihistamine, starting doxycycline, and prednisone. We discussed common side effects. She can continue taking the mucinex and hycodan. Follow up if no improvement ?

## 2021-05-17 NOTE — Telephone Encounter (Signed)
Pt daughter called stating that pt saw Amy Lomax with [facility] today and detected wheezing. Amy wants pt to be seen as soon as possible and wonders if an inhaler or steroids would better suit her. Amy is also concern if pt has bronchitis. Please advise.   ?

## 2021-05-17 NOTE — Progress Notes (Signed)
? ? ?PATIENT: Alice Smith ?DOB: [birth year] ? ?REASON FOR VISIT: follow up ?HISTORY FROM: patient ? ?Chief Complaint  ?Patient presents with  ? Follow-up  ?  RM 16 with daughter, [name]. Last seen 11/17/20. Daughter feels there is decline since last visit. Had fall two days ago getting out of bed. Fire department came to get her up. She was ok post fall. Has chronic cough. Has worsened this week. Husband took her to PCP yesterday. Prescribed hycodan prn.  Chest xray done yesterday.   ? ?  ?HISTORY OF PRESENT ILLNESS: ? ?05/17/21 ALL:  ?Alice Smith returns for follow up for dementia. She continues Namenda and Aricept. Memory seems to have declined some. She is having more difficulty with conversations. She does not initiate conversation but usually able to participate if someone speaks to her first. She continues to need assistance with most ADLs. Able to get dressed. Husband helps with bathing but she is able to wash her hair. Appetite is good. They have some concerns of her binge eating if not supervised. She is sleeping really well.  ? ?She continues close follow up with PCP. She was seen yesterday for chronic cough, bowel incontinence, urinary frequency and recent fall. She was prescribed Hycodan and advised to continue Mucinex. Labs were unremarkable but unable to obtain UA. She wears depends.  ? ?11/16/2020 ALL:  ?Alice Smith returns for follow up for dementia. She continues Namenda and Aricept. Memory seems stable. No significant changes. She is sleeping well. Remains mostly independent with ADLs but does need help preparing meals and medications.  ?She had Covid about a month ago. She had mild symptoms. She took antiviral and recovered well. She continues to walk three times a day. She is followed closely by PCP.  ? ?05/17/2020 ALL:  ?Alice Smith returns for follow up for dementia. She continues Aricept and Namenda. She presents with [name], her daughter, who aids in history. Memory waxes and wanes. Seems happy but  mostly confused. She does not complain of any pain unless an area (knees, toes) are manipulated. She needs to be reminded to try to use the restroom. Occasionally has accidents. She will not use public restroom. No UTIs. Family reminds her to drink fluids. Miralax is helping with constipation. She showers independently. Some assistance with getting dressed. She will eat if food is provided but she rarely initiates meals. She will pick up a snack from the counter. She continues to walk 1-2 miles daily. No difficulty with gait. No falls. Has 7 children. Husband and children provide 24/7 supervision. Youngest son (of 7 children) still lives at home.  ? ?11/18/2019 ALL:  ?Alice Smith is a 76 y.o. female here today for follow up for dementia. She continues Aricept and Namenda. She is tolerating medications well. Mr Scaduto is helping to dose medications. She continues ADL's independently. She is eating and drinking normally. She has had several episode of bowel incontinence. She is being treated for constipation. This has helped. She is not comfortable using public restrooms. Memory waxes and wanes. She continues to have lack of interest. She has a flat affect. No changes in movement. She is walking 2 miles a day. She continues to go to church.  ? ?HISTORY: (copied from my note on 02/23/2019) ? ?Alice Smith is a 76 y.o. female here today for follow up for dementia. She continues Aricept '10mg'$  daily and Namenda '10mg'$  BID. She is tolerating medications well. Her husband is with her today and aids in history. He reports that  memory is fairly stable. They have noticed some worsening with concentration. She is wanting to sit still more than usual. She continues to walk about 1 mile several times a week. She has had more urine and bowel incontinence. She has had two accidents over the past 6 months. She is wearing pads. She is eating well. She does seem to enjoy simple sugars. She is able to perform ADL's. She does not  cook. She does not drive. No falls.  ?  ?HISTORY: (copied from my note on 08/11/2018) ?  ?Alice Smith is a 76 y.o. female here today for follow up of dementia.  Alice Smith is accompanied by her husband, Mr. Anchondo, who aids in obtaining history.  He reports that overall she has been fairly stable since her last visit in June 2019.  Unfortunately there seems to be some confusion on what medication she was taking. At her last visit, she was instructed to continue Namenda 10 mg twice daily.  Dr. Leta Baptist added Aricept 10 mg daily (titrating from '5mg'$  daily x 1 week).  They are unsure how long she continued Namenda.  It appears that it was discontinued from her medication list.  Mr. Durell does not feel that she has taken this in some time.  She did tolerate the medication well.  She continues to perform all ADLs at home.  She is able to dress bathe and feed herself.  He reports that she can remember events from long ago but struggles with short-term information.  She does very rarely drive when accompanied by a family member.  Her husband reports that this is only short distances and he either rides with her or follows her.  He denies any falls or injuries.  No behavioral concerns at this time. ?  ?HISTORY (copied from Dr Gladstone Lighter note on 08/06/2017)  ?  ?UPDATE (08/06/17, VRP): Since last visit, doing slightly worse. Tolerating memantine. No alleviating or aggravating factors. Driving less now. Some diff with shopping. MMSE 19/30. Alice Smith 6/6. [place] IADL 5/8.  ?  ?PRIOR HPI (02/04/17): 76 year old left-handed female here for evaluation of cognitive decline, confusion, lack of emotions, difficulty processing information. ?  ?Family has noted at least 2 years of gradual onset progressive confusion, fatigue, decreased processing speed, comprehension difficulty, decreased interest in day-to-day activity specifically household chores and cleanliness, which she used to be quite involved with.  She is also shown flat and  emotional affect, less nurturing, less social.  She has had trouble with maintaining her checkbook, finances, shopping and ordering.  In June 2018 she got lost in [place] when trying to pick up their grandson. ?  ?Overall patient having decreased speech output and fluency. ?  ?Patient also underwent 7 or 8 surgeries in 2014, and family noted that many symptoms started at that time ? ? ?REVIEW OF SYSTEMS: Out of a complete 14 system review of symptoms, the patient complains only of the following symptoms, memory loss, incontinence, cough, recent fall and all other reviewed systems are negative. ? ? ?ALLERGIES: ?Allergies  ?Allergen Reactions  ? Clarithromycin   ?  REACTION: nausea and vomiting  ? Erythromycin Ethylsuccinate   ?  REACTION: GI upset  ? Fluticasone-Salmeterol   ?  REACTION: Sore tongue and rash  ? Procaine Hcl   ?  REACTION: swelling  ? Sulfonamide Derivatives   ?  REACTION: rash  ? Topiramate   ?  REACTION: confusion  ? ? ?HOME MEDICATIONS: ?Outpatient Medications Prior to Visit  ?Medication Sig Dispense Refill  ?  Bacillus Coagulans-Inulin (PROBIOTIC) 1-250 BILLION-MG CAPS Take by mouth daily.    ? calcium-vitamin D (OSCAL WITH D) 500-200 MG-UNIT tablet Take 1 tablet by mouth daily.    ? Cholecalciferol (DIALYVITE VITAMIN D 5000) 125 MCG (5000 UT) capsule Take 1 capsule (5,000 Units total) by mouth daily.    ? cyanocobalamin (,VITAMIN B-12,) 1000 MCG/ML injection Inject 1 mL (1,000 mcg total) into the muscle every 14 (fourteen) days.    ? donepezil (ARICEPT) 10 MG tablet Take 1 tablet (10 mg total) by mouth at bedtime. 90 tablet 3  ? famotidine (PEPCID) 20 MG tablet Take 1 tablet (20 mg total) by mouth 2 (two) times daily. 180 tablet 1  ? HYDROcodone bit-homatropine (HYCODAN) 5-1.5 MG/5ML syrup Take 5 mLs by mouth at bedtime as needed for up to 10 days for cough. 50 mL 0  ? hydrocortisone 2.5 % cream Apply to rash on neck and arms once to twice daily until improved. 30 g 3  ? loratadine  (CLARITIN) 10 MG tablet Take 10 mg by mouth daily.    ? memantine (NAMENDA) 10 MG tablet Take 1 tablet (10 mg total) by mouth 2 (two) times daily. 180 tablet 3  ? ?No facility-administered medications prior to visit.

## 2021-05-17 NOTE — Telephone Encounter (Signed)
Patient was seen by Romilda Garret, NP while patient was in the office today for B12 shot. See mychart messages. Nothing further is needed.  ?

## 2021-05-17 NOTE — Progress Notes (Signed)
Pt came in today to receive her Vit b12 injection pt was given  Vit B12 1,000 mcg/ml IM  in the Right deltoid successfully.   ?

## 2021-05-17 NOTE — Patient Instructions (Addendum)
Below is our plan: ? ?We will continue namenda '10mg'$  twice daily and Aricept '10mg'$  at bedtime. Please keep close eye on cough. Hycodan can make her a little sleepy so monitor her closely. Let PCP know, today, about wheezing. Consider Mucinex '1200mg'$  every 12 hours and either Zyrtec or Xyzal if ok with PCP.  ? ?Please make sure you are staying well hydrated. I recommend 50-60 ounces daily. Well balanced diet and regular exercise encouraged. Consistent sleep schedule with 6-8 hours recommended.  ? ?Please continue follow up with care team as directed.  ? ?Follow up with me in 6-12 months ? ?You may receive a survey regarding today's visit. I encourage you to leave honest feed back as I do use this information to improve patient care. Thank you for seeing me today!  ? ?Management of Memory Problems ?  ?There are some general things you can do to help manage your memory problems.  Your memory may not in fact recover, but by using techniques and strategies you will be able to manage your memory difficulties better. ?  ?1)  Establish a routine. ?Try to establish and then stick to a regular routine.  By doing this, you will get used to what to expect and you will reduce the need to rely on your memory.  Also, try to do things at the same time of day, such as taking your medication or checking your calendar first thing in the morning. ?Think about think that you can do as a part of a regular routine and make a list.  Then enter them into a daily planner to remind you.  This will help you establish a routine. ?  ?2)  Organize your environment. ?Organize your environment so that it is uncluttered.  Decrease visual stimulation.  Place everyday items such as keys or cell phone in the same place every day (ie.  Basket next to front door) ?Use post it notes with a brief message to yourself (ie. Turn off light, lock the door) ?Use labels to indicate where things go (ie. Which cupboards are for food, dishes, etc.) ?Keep a notepad and  pen by the telephone to take messages ?  ?3)  Memory Aids ?A diary or journal/notebook/daily planner ?Making a list (shopping list, chore list, to do list that needs to be done) ?Using an alarm as a reminder (kitchen timer or cell phone alarm) ?Using cell phone to store information (Notes, Calendar, Reminders) ?Calendar/White board placed in a prominent position ?Post-it notes ?  ?In order for memory aids to be useful, you need to have good habits.  It's no good remembering to make a note in your journal if you don't remember to look in it.  Try setting aside a certain time of day to look in journal. ?  ?4)  Improving mood and managing fatigue. ?There may be other factors that contribute to memory difficulties.  Factors, such as anxiety, depression and tiredness can affect memory. ?Regular gentle exercise can help improve your mood and give you more energy. ?Simple relaxation techniques may help relieve symptoms of anxiety ?Try to get back to completing activities or hobbies you enjoyed doing in the past. ?Learn to pace yourself through activities to decrease fatigue. ?Find out about some local support groups where you can share experiences with others. ?Try and achieve 7-8 hours of sleep at night. ? ? ? ?

## 2021-05-18 ENCOUNTER — Ambulatory Visit: Payer: Medicare Other | Admitting: Nurse Practitioner

## 2021-05-31 ENCOUNTER — Ambulatory Visit (INDEPENDENT_AMBULATORY_CARE_PROVIDER_SITE_OTHER): Payer: Medicare Other

## 2021-05-31 DIAGNOSIS — E538 Deficiency of other specified B group vitamins: Secondary | ICD-10-CM | POA: Diagnosis not present

## 2021-05-31 MED ORDER — CYANOCOBALAMIN 1000 MCG/ML IJ SOLN
1000.0000 ug | Freq: Once | INTRAMUSCULAR | Status: AC
  Administered 2021-05-31: 1000 ug via INTRAMUSCULAR

## 2021-05-31 NOTE — Progress Notes (Signed)
Per orders of Allie Bossier, in Dr. Josefine Class absence, every 2 week injection of B12 given by Loreen Freud. ?Patient tolerated injection well.  ?

## 2021-06-21 ENCOUNTER — Other Ambulatory Visit (INDEPENDENT_AMBULATORY_CARE_PROVIDER_SITE_OTHER): Payer: Medicare Other

## 2021-06-21 ENCOUNTER — Ambulatory Visit (INDEPENDENT_AMBULATORY_CARE_PROVIDER_SITE_OTHER): Payer: Medicare Other

## 2021-06-21 DIAGNOSIS — E538 Deficiency of other specified B group vitamins: Secondary | ICD-10-CM

## 2021-06-21 MED ORDER — CYANOCOBALAMIN 1000 MCG/ML IJ SOLN
1000.0000 ug | Freq: Once | INTRAMUSCULAR | Status: AC
  Administered 2021-06-21: 1000 ug via INTRAMUSCULAR

## 2021-06-21 NOTE — Progress Notes (Signed)
Per orders of Dr. Javier Gutierrez, injection of B-12 given by Cas Tracz in left deltoid. Patient tolerated injection well.    

## 2021-06-22 LAB — VITAMIN B12: Vitamin B-12: 1288 pg/mL — ABNORMAL HIGH (ref 211–911)

## 2021-06-23 NOTE — Progress Notes (Signed)
Note need cosigned.  ?

## 2021-06-28 ENCOUNTER — Other Ambulatory Visit: Payer: Self-pay | Admitting: Family Medicine

## 2021-06-28 DIAGNOSIS — E538 Deficiency of other specified B group vitamins: Secondary | ICD-10-CM

## 2021-06-28 MED ORDER — CYANOCOBALAMIN 1000 MCG/ML IJ SOLN: 1000.0000 ug | INTRAMUSCULAR | Status: AC

## 2021-07-13 ENCOUNTER — Ambulatory Visit (INDEPENDENT_AMBULATORY_CARE_PROVIDER_SITE_OTHER): Payer: Medicare Other

## 2021-07-13 DIAGNOSIS — E538 Deficiency of other specified B group vitamins: Secondary | ICD-10-CM

## 2021-07-13 MED ORDER — CYANOCOBALAMIN 1000 MCG/ML IJ SOLN
1000.0000 ug | Freq: Once | INTRAMUSCULAR | Status: AC
  Administered 2021-07-13: 1000 ug via INTRAMUSCULAR

## 2021-07-13 NOTE — Progress Notes (Signed)
Per orders of Dr. Duncan, injection of vit B12 given by Caily Rakers. Patient tolerated injection well.  

## 2021-08-02 ENCOUNTER — Ambulatory Visit (INDEPENDENT_AMBULATORY_CARE_PROVIDER_SITE_OTHER): Payer: Medicare Other

## 2021-08-02 DIAGNOSIS — E538 Deficiency of other specified B group vitamins: Secondary | ICD-10-CM | POA: Diagnosis not present

## 2021-08-02 MED ORDER — CYANOCOBALAMIN 1000 MCG/ML IJ SOLN
1000.0000 ug | Freq: Once | INTRAMUSCULAR | Status: AC
  Administered 2021-08-02: 1000 ug via INTRAMUSCULAR

## 2021-08-02 NOTE — Progress Notes (Signed)
Patient presented for B 12 injection given by Debar Plate, CMA to right deltoid, patient voiced no concerns nor showed any signs of distress during injection.  

## 2021-08-21 ENCOUNTER — Other Ambulatory Visit: Payer: Self-pay

## 2021-08-21 MED ORDER — MEMANTINE HCL 10 MG PO TABS: 10.0000 mg | ORAL_TABLET | Freq: Two times a day (BID) | ORAL | 1 refills | Status: DC

## 2021-08-21 NOTE — Progress Notes (Signed)
Rx refilled as per last office visit note. 

## 2021-08-23 ENCOUNTER — Ambulatory Visit (INDEPENDENT_AMBULATORY_CARE_PROVIDER_SITE_OTHER): Payer: Medicare Other

## 2021-08-23 DIAGNOSIS — E538 Deficiency of other specified B group vitamins: Secondary | ICD-10-CM | POA: Diagnosis not present

## 2021-08-23 MED ORDER — CYANOCOBALAMIN 1000 MCG/ML IJ SOLN
1000.0000 ug | Freq: Once | INTRAMUSCULAR | Status: AC
  Administered 2021-08-23: 1000 ug via INTRAMUSCULAR

## 2021-08-23 NOTE — Progress Notes (Signed)
Per orders of Romilda Garret, NP biweekly injection of B12 given by Loreen Freud. Patient tolerated injection well.

## 2021-09-01 NOTE — Telephone Encounter (Signed)
error 

## 2021-09-06 ENCOUNTER — Ambulatory Visit (INDEPENDENT_AMBULATORY_CARE_PROVIDER_SITE_OTHER): Payer: Medicare Other

## 2021-09-06 DIAGNOSIS — E538 Deficiency of other specified B group vitamins: Secondary | ICD-10-CM | POA: Diagnosis not present

## 2021-09-06 MED ORDER — CYANOCOBALAMIN 1000 MCG/ML IJ SOLN
1000.0000 ug | Freq: Once | INTRAMUSCULAR | Status: AC
  Administered 2021-09-06: 1000 ug via INTRAMUSCULAR

## 2021-09-06 NOTE — Progress Notes (Signed)
Per orders of Allie Bossier, in Dr. Josefine Class absence, bi-weekly injection of B12 given by Loreen Freud. Patient tolerated injection well.

## 2021-09-19 ENCOUNTER — Other Ambulatory Visit: Payer: Self-pay | Admitting: Family Medicine

## 2021-09-19 DIAGNOSIS — E538 Deficiency of other specified B group vitamins: Secondary | ICD-10-CM

## 2021-09-20 ENCOUNTER — Ambulatory Visit: Payer: Medicare Other

## 2021-09-26 ENCOUNTER — Other Ambulatory Visit: Payer: Self-pay

## 2021-09-26 DIAGNOSIS — F03B0 Unspecified dementia, moderate, without behavioral disturbance, psychotic disturbance, mood disturbance, and anxiety: Secondary | ICD-10-CM

## 2021-09-26 MED ORDER — DONEPEZIL HCL 10 MG PO TABS: 10.0000 mg | ORAL_TABLET | Freq: Every day | ORAL | 0 refills | Status: DC

## 2021-09-27 ENCOUNTER — Ambulatory Visit (INDEPENDENT_AMBULATORY_CARE_PROVIDER_SITE_OTHER): Payer: Medicare Other

## 2021-09-27 ENCOUNTER — Other Ambulatory Visit (INDEPENDENT_AMBULATORY_CARE_PROVIDER_SITE_OTHER): Payer: Medicare Other

## 2021-09-27 DIAGNOSIS — E538 Deficiency of other specified B group vitamins: Secondary | ICD-10-CM

## 2021-09-27 MED ORDER — CYANOCOBALAMIN 1000 MCG/ML IJ SOLN
1000.0000 ug | Freq: Once | INTRAMUSCULAR | Status: AC
  Administered 2021-09-27: 1000 ug via INTRAMUSCULAR

## 2021-09-27 NOTE — Progress Notes (Signed)
Patient presented for B 12 injection given by Lacee Grey, CMA to right deltoid, patient voiced no concerns nor showed any signs of distress during injection.  

## 2021-09-28 LAB — VITAMIN B12: Vitamin B-12: 796 pg/mL (ref 211–911)

## 2021-09-29 ENCOUNTER — Telehealth: Payer: Self-pay | Admitting: Family Medicine

## 2021-09-29 ENCOUNTER — Encounter: Payer: Self-pay | Admitting: Family Medicine

## 2021-09-29 NOTE — Telephone Encounter (Signed)
Pt's daughter, Joanna Puff (on [place]) requesting a letter of diagnosis for my mother's dementia. Court requiring taking guardianship of my adult brother. Would like a call from the nurse.

## 2021-10-02 NOTE — Telephone Encounter (Signed)
Called the daughter back. She had also sent a mychart message. She is needing this letter to release guardianship of her older son given her dementia diagnosis. Advised we would write this up and get it sent to her. She asked it be emailed.

## 2021-10-18 ENCOUNTER — Ambulatory Visit (INDEPENDENT_AMBULATORY_CARE_PROVIDER_SITE_OTHER): Payer: Medicare Other | Admitting: *Deleted

## 2021-10-18 DIAGNOSIS — E538 Deficiency of other specified B group vitamins: Secondary | ICD-10-CM

## 2021-10-18 MED ORDER — CYANOCOBALAMIN 1000 MCG/ML IJ SOLN
1000.0000 ug | Freq: Once | INTRAMUSCULAR | Status: AC
  Administered 2021-10-18: 1000 ug via INTRAMUSCULAR

## 2021-10-18 NOTE — Progress Notes (Signed)
Per orders of Dr. Gutierrez, injection of Vitamin B-12 given by Cleatus Goodin. Patient tolerated injection well. 

## 2021-11-16 NOTE — Progress Notes (Deleted)
PATIENT: Alice Smith DOB: October 18, 1945  REASON FOR VISIT: follow up HISTORY FROM: patient  No chief complaint on file.    HISTORY OF PRESENT ILLNESS:  11/16/21 ALL:  Alice Smith returns for follow up for dementia. She continues Namenda and Aricept.   05/17/2021 ALL:  Alice Smith returns for follow up for dementia. She continues Namenda and Aricept. Memory seems to have declined some. She is having more difficulty with conversations. She does not initiate conversation but usually able to participate if someone speaks to her first. She continues to need assistance with most ADLs. Able to get dressed. Husband helps with bathing but she is able to wash her hair. Appetite is good. They have some concerns of her binge eating if not supervised. She is sleeping really well.   She continues close follow up with PCP. She was seen yesterday for chronic cough, bowel incontinence, urinary frequency and recent fall. She was prescribed Hycodan and advised to continue Mucinex. Labs were unremarkable but unable to obtain UA. She wears depends.   11/16/2020 ALL:  Alice Smith returns for follow up for dementia. She continues Namenda and Aricept. Memory seems stable. No significant changes. She is sleeping well. Remains mostly independent with ADLs but does need help preparing meals and medications.  She had Covid about a month ago. She had mild symptoms. She took antiviral and recovered well. She continues to walk three times a day. She is followed closely by PCP.   05/17/2020 ALL:  Alice Smith returns for follow up for dementia. She continues Aricept and Namenda. She presents with [name], her daughter, who aids in history. Memory waxes and wanes. Seems happy but mostly confused. She does not complain of any pain unless an area (knees, toes) are manipulated. She needs to be reminded to try to use the restroom. Occasionally has accidents. She will not use public restroom. No UTIs. Family reminds her to drink fluids.  Miralax is helping with constipation. She showers independently. Some assistance with getting dressed. She will eat if food is provided but she rarely initiates meals. She will pick up a snack from the counter. She continues to walk 1-2 miles daily. No difficulty with gait. No falls. Has 7 children. Husband and children provide 24/7 supervision. Youngest son (of 7 children) still lives at home.   11/18/2019 ALL:  Alice Smith is a 76 y.o. female here today for follow up for dementia. She continues Aricept and Namenda. She is tolerating medications well. Mr Muchmore is helping to dose medications. She continues ADL's independently. She is eating and drinking normally. She has had several episode of bowel incontinence. She is being treated for constipation. This has helped. She is not comfortable using public restrooms. Memory waxes and wanes. She continues to have lack of interest. She has a flat affect. No changes in movement. She is walking 2 miles a day. She continues to go to church.   HISTORY: (copied from my note on 02/23/2019)  Alice Smith is a 76 y.o. female here today for follow up for dementia. She continues Aricept '10mg'$  daily and Namenda '10mg'$  BID. She is tolerating medications well. Her husband is with her today and aids in history. He reports that memory is fairly stable. They have noticed some worsening with concentration. She is wanting to sit still more than usual. She continues to walk about 1 mile several times a week. She has had more urine and bowel incontinence. She has had two accidents over the past 6 months. She  is wearing pads. She is eating well. She does seem to enjoy simple sugars. She is able to perform ADL's. She does not cook. She does not drive. No falls.    HISTORY: (copied from my note on 08/11/2018)   Alice Smith is a 76 y.o. female here today for follow up of dementia.  Alice Smith is accompanied by her husband, Mr. Budreau, who aids in obtaining history.  He reports  that overall she has been fairly stable since her last visit in June 2019.  Unfortunately there seems to be some confusion on what medication she was taking. At her last visit, she was instructed to continue Namenda 10 mg twice daily.  Dr. Leta Baptist added Aricept 10 mg daily (titrating from '5mg'$  daily x 1 week).  They are unsure how long she continued Namenda.  It appears that it was discontinued from her medication list.  Mr. Peeters does not feel that she has taken this in some time.  She did tolerate the medication well.  She continues to perform all ADLs at home.  She is able to dress bathe and feed herself.  He reports that she can remember events from long ago but struggles with short-term information.  She does very rarely drive when accompanied by a family member.  Her husband reports that this is only short distances and he either rides with her or follows her.  He denies any falls or injuries.  No behavioral concerns at this time.   HISTORY (copied from Dr Gladstone Lighter note on 08/06/2017)    UPDATE (08/06/17, VRP): Since last visit, doing slightly worse. Tolerating memantine. No alleviating or aggravating factors. Driving less now. Some diff with shopping. MMSE 19/30. Ron Parker 6/6. [place] IADL 5/8.    PRIOR HPI (02/04/17): 76 year old left-handed female here for evaluation of cognitive decline, confusion, lack of emotions, difficulty processing information.   Family has noted at least 2 years of gradual onset progressive confusion, fatigue, decreased processing speed, comprehension difficulty, decreased interest in day-to-day activity specifically household chores and cleanliness, which she used to be quite involved with.  She is also shown flat and emotional affect, less nurturing, less social.  She has had trouble with maintaining her checkbook, finances, shopping and ordering.  In June 2018 she got lost in [place] when trying to pick up their grandson.   Overall patient having decreased speech  output and fluency.   Patient also underwent 7 or 8 surgeries in 2014, and family noted that many symptoms started at that time   REVIEW OF SYSTEMS: Out of a complete 14 system review of symptoms, the patient complains only of the following symptoms, memory loss, incontinence, cough, recent fall and all other reviewed systems are negative.   ALLERGIES: Allergies  Allergen Reactions   Clarithromycin     REACTION: nausea and vomiting   Erythromycin Ethylsuccinate     REACTION: GI upset   Fluticasone-Salmeterol     REACTION: Sore tongue and rash   Procaine Hcl     REACTION: swelling   Sulfonamide Derivatives     REACTION: rash   Topiramate     REACTION: confusion    HOME MEDICATIONS: Outpatient Medications Prior to Visit  Medication Sig Dispense Refill   Bacillus Coagulans-Inulin (PROBIOTIC) 1-250 BILLION-MG CAPS Take by mouth daily.     calcium-vitamin D (OSCAL WITH D) 500-200 MG-UNIT tablet Take 1 tablet by mouth daily.     Cholecalciferol (DIALYVITE VITAMIN D 5000) 125 MCG (5000 UT) capsule Take 1 capsule (5,000 Units total)  by mouth daily.     cyanocobalamin (,VITAMIN B-12,) 1000 MCG/ML injection Inject 1 mL (1,000 mcg total) into the muscle every 21 ( twenty-one) days. 1 mL    [START ON 11/19/2021] donepezil (ARICEPT) 10 MG tablet Take 1 tablet (10 mg total) by mouth at bedtime. 90 tablet 0   famotidine (PEPCID) 20 MG tablet Take 1 tablet (20 mg total) by mouth 2 (two) times daily. 180 tablet 1   hydrocortisone 2.5 % cream Apply to rash on neck and arms once to twice daily until improved. 30 g 3   loratadine (CLARITIN) 10 MG tablet Take 10 mg by mouth daily.     memantine (NAMENDA) 10 MG tablet Take 1 tablet (10 mg total) by mouth 2 (two) times daily. 180 tablet 1   No facility-administered medications prior to visit.    PAST MEDICAL HISTORY: Past Medical History:  Diagnosis Date   Arthritis    Breast mass 09/2954   benign   Complication of anesthesia    slow to wake  up x 1   Concussion 2014, 05/2016   Dry cough    at night   Hemorrhoids    Migraine    evaluated with Dr. Domingo Cocking 11/2009   Osteoporosis     PAST SURGICAL HISTORY: Past Surgical History:  Procedure Laterality Date   ABDOMINAL EXPLORATION SURGERY     for twisted bowel   ABDOMINAL HYSTERECTOMY     yrs ago   [place]   aspirated lump left breast   CARPAL TUNNEL RELEASE     right 03/2001, left 07/2001   ESOPHAGOGASTRODUODENOSCOPY     KNEE ARTHROSCOPY     rt knee   nissenfundiplication     PARTIAL HYSTERECTOMY  1989   dyspasia, ovaries left in   PARTIAL KNEE ARTHROPLASTY Right 06/23/2013   Procedure: RIGHT KNEE MEDIAL UNICOMPARTMENTAL ARTHROPLASTY;  Surgeon: Mauri Pole, MD;  Location: [facility];  Service: Orthopedics;  Laterality: Right;   PARTIAL KNEE ARTHROPLASTY Left 08/31/2013   Procedure: LEFT UNICOMPARTMENTAL KNEE ATHROPLASTY   (MEDIAL) ;  Surgeon: Mauri Pole, MD;  Location: [facility];  Service: Orthopedics;  Laterality: Left;   ROTATOR CUFF REPAIR     right 03/2001, left 07/2001    FAMILY HISTORY: Family History  Problem Relation Age of Onset   Arthritis Mother    Cancer Mother        lung cancer, dx'd in her 77s   Cancer Father        throat   COPD Brother    Cancer Sister        breast   Breast cancer Sister    Breast cancer Sister    Colon cancer Neg Hx     SOCIAL HISTORY: Social History   Socioeconomic History   Marital status: Married    Spouse name: Not on file   Number of children: 3   Years of education: Not on file   Highest education level: Not on file  Occupational History   Occupation: home  Tobacco Use   Smoking status: Former    Packs/day: 1.00    Years: 5.00    Total pack years: 5.00    Types: Cigarettes    Quit date: 06/17/1978    Years since quitting: 43.4   Smokeless tobacco: Never   Tobacco comments:    5 pack year history  Substance and Sexual Activity   Alcohol use: No   Drug use: No   Sexual activity: Not on  file   Other Topics Concern   Not on file  Social History Narrative   Second marriage, 46. Has 3 adult children, adopted 4, 6 grandkids.   From [place], [place]   Education- GED, some college   Social Determinants of [place] Strain: Low Risk  (07/06/2019)   Overall Financial Resource Strain (CARDIA)    Difficulty of Paying Living Expenses: Not hard at all  Food Insecurity: No Food Insecurity (07/06/2019)   Hunger Vital Sign    Worried About Running Out of Food in the Last Year: Never true    [place] in the Last Year: Never true  Transportation Needs: No Transportation Needs (07/06/2019)   PRAPARE - Hydrologist (Medical): No    Lack of Transportation (Non-Medical): No  Physical Activity: Sufficiently Active (07/06/2019)   Exercise Vital Sign    Days of Exercise per Week: 3 days    Minutes of Exercise per Session: 50 min  Stress: No Stress Concern Present (07/06/2019)   [place]    Feeling of Stress : Not at all  Social Connections: Not on file  Intimate Partner Violence: Not At Risk (07/06/2019)   Humiliation, Afraid, Rape, and Kick questionnaire    Fear of Current or Ex-Partner: No    Emotionally Abused: No    Physically Abused: No    Sexually Abused: No      PHYSICAL EXAM  There were no vitals filed for this visit.    There is no height or weight on file to calculate BMI.  Generalized: Well developed, in no acute distress, flat affect  Cardiology: normal rate and rhythm, no murmur noted Respiratory: diffuse wheezing of upper and lower lobes, bilaterally, with auscultation of posterior lung fields. No acute dyspnea. Patient coughs throughout visit. Nonproductive. O2 99%.  Neurological examination  Mentation: Alert, Unable to complete MMSE, not oriented to time or place, not able to assist with history taking. Follows all commands speech and language  fluent.  Cranial nerve II-XII: Pupils were equal round reactive to light. Extraocular movements were full, visual field were full on confrontational test. Facial sensation and strength were normal. Head turning and shoulder shrug  were normal and symmetric. Motor: The motor testing reveals 5 over 5 strength of all 4 extremities. Good symmetric motor tone is noted throughout. No bradykinesia noted. No cogwheel rigidity.  Unable to test coordination.  Gait and station: Gait is normal. Normal posture.    DIAGNOSTIC DATA (LABS, IMAGING, TESTING) - I reviewed patient records, labs, notes, testing and imaging myself where available.     05/17/2020    2:33 PM 11/18/2019    1:56 PM 07/06/2019    9:04 AM  MMSE - Mini Mental State Exam  Not completed: Unable to complete  Refused  Orientation to time  1   Orientation to Place  2   Registration  3   Attention/ Calculation  0   Recall  2   Language- name 2 objects  2   Language- repeat  1   Language- follow 3 step command  3   Language- read & follow direction  0   Write a sentence  0   Copy design  0   Total score  14      Lab Results  Component Value Date   WBC 5.7 05/16/2021   HGB 14.4 05/16/2021   HCT 42.8 05/16/2021   MCV 92.3  05/16/2021   PLT 205.0 05/16/2021      Component Value Date/Time   NA 140 05/16/2021 1053   K 3.8 05/16/2021 1053   CL 104 05/16/2021 1053   CO2 27 05/16/2021 1053   GLUCOSE 92 05/16/2021 1053   BUN 13 05/16/2021 1053   CREATININE 0.84 05/16/2021 1053   CREATININE 0.81 07/25/2018 1527   CALCIUM 9.2 05/16/2021 1053   PROT 6.7 05/16/2021 1053   ALBUMIN 4.0 05/16/2021 1053   AST 23 05/16/2021 1053   ALT 19 05/16/2021 1053   ALKPHOS 95 05/16/2021 1053   BILITOT 0.8 05/16/2021 1053   GFRNONAA 90 (L) 09/01/2013 0452   GFRAA >90 09/01/2013 0452   Lab Results  Component Value Date   CHOL 216 (H) 03/24/2021   HDL 50.10 03/24/2021   LDLCALC 132 (H) 03/24/2021   LDLDIRECT 129.8 10/01/2011   TRIG 170.0  (H) 03/24/2021   CHOLHDL 4 03/24/2021   No results found for: "HGBA1C" Lab Results  Component Value Date   VITAMINB12 796 09/27/2021   Lab Results  Component Value Date   TSH 6.74 (H) 03/24/2021       ASSESSMENT AND PLAN 76 y.o. year old female  has a past medical history of Arthritis, Breast mass (03/9516), Complication of anesthesia, Concussion (2014, 05/2016), Dry cough, Hemorrhoids, Migraine, and Osteoporosis. here with   No diagnosis found.    [place] is doing fairly well from memory standpoint. Family has noticed some decline in conversation. Long term memory still very much intact.. We will continue Aricept and Namenda as prescribed. I have discussed clinical progression and disease process with her daughter. We will focus on safety and comfort. I have asked her to let Dr Damita Dunnings know, today at B12 visit, that wheezing seems to have worsened since visit yesterday. Xray was reassuring. Hycodan did help some last night but cough is fairly constant with audible wheezing in the room. Vital signs stable. No obvious difficult breathing an no reported chest pain. She has significant history of seasonal allergies/asthma. She was encouraged to continue healthy lifestyle habits. She will continue close follow up with PCP. She will stay active. She will follow up with me in 6-12 months.    No orders of the defined types were placed in this encounter.     No orders of the defined types were placed in this encounter.   I spent 30 minutes of face-to-face and non-face-to-face time with patient.  This included previsit chart review, lab review, study review, order entry, electronic health record documentation, patient education.    Debbora Presto, FNP-C 11/16/2021, 9:42 AM [facility] [address], [place], [place] [address] [phone]

## 2021-11-20 ENCOUNTER — Ambulatory Visit: Payer: Medicare Other | Admitting: Family Medicine

## 2021-11-20 DIAGNOSIS — F03B0 Unspecified dementia, moderate, without behavioral disturbance, psychotic disturbance, mood disturbance, and anxiety: Secondary | ICD-10-CM

## 2021-11-22 ENCOUNTER — Ambulatory Visit (INDEPENDENT_AMBULATORY_CARE_PROVIDER_SITE_OTHER): Payer: Medicare Other

## 2021-11-22 DIAGNOSIS — E538 Deficiency of other specified B group vitamins: Secondary | ICD-10-CM

## 2021-11-22 MED ORDER — CYANOCOBALAMIN 1000 MCG/ML IJ SOLN
1000.0000 ug | Freq: Once | INTRAMUSCULAR | Status: AC
  Administered 2021-11-22: 1000 ug via INTRAMUSCULAR

## 2021-11-22 NOTE — Progress Notes (Signed)
Per orders of Dr. Danise Mina, in Dr. Josefine Class absence, q 3 week injection of B12, given by Loreen Freud. Patient tolerated injection well.

## 2021-12-12 ENCOUNTER — Other Ambulatory Visit: Payer: Self-pay

## 2021-12-12 DIAGNOSIS — F03B0 Unspecified dementia, moderate, without behavioral disturbance, psychotic disturbance, mood disturbance, and anxiety: Secondary | ICD-10-CM

## 2021-12-13 ENCOUNTER — Other Ambulatory Visit: Payer: Self-pay | Admitting: Neurology

## 2021-12-13 ENCOUNTER — Ambulatory Visit (INDEPENDENT_AMBULATORY_CARE_PROVIDER_SITE_OTHER): Payer: Medicare Other

## 2021-12-13 DIAGNOSIS — E538 Deficiency of other specified B group vitamins: Secondary | ICD-10-CM | POA: Diagnosis not present

## 2021-12-13 MED ORDER — CYANOCOBALAMIN 1000 MCG/ML IJ SOLN
1000.0000 ug | Freq: Once | INTRAMUSCULAR | Status: AC
  Administered 2021-12-13: 1000 ug via INTRAMUSCULAR

## 2021-12-13 MED ORDER — DONEPEZIL HCL 10 MG PO TABS: 10.0000 mg | ORAL_TABLET | Freq: Every day | ORAL | 0 refills | Status: DC

## 2021-12-13 MED ORDER — MEMANTINE HCL 10 MG PO TABS: 10.0000 mg | ORAL_TABLET | Freq: Two times a day (BID) | ORAL | 1 refills | Status: DC

## 2021-12-13 NOTE — Progress Notes (Signed)
Per orders of Dr. Ria Bush, injection of B-12 given by Pat Kocher in left deltoid. Patient tolerated injection well. Patient will make appointment for 2 week.

## 2021-12-14 ENCOUNTER — Other Ambulatory Visit: Payer: Self-pay | Admitting: Neurology

## 2021-12-14 DIAGNOSIS — F03B0 Unspecified dementia, moderate, without behavioral disturbance, psychotic disturbance, mood disturbance, and anxiety: Secondary | ICD-10-CM

## 2021-12-14 MED ORDER — DONEPEZIL HCL 10 MG PO TABS: 10.0000 mg | ORAL_TABLET | Freq: Every day | ORAL | 0 refills | Status: DC

## 2021-12-25 NOTE — Patient Instructions (Signed)
Below is our plan:  We will continue memantine and donepezil. Try Tylenol 1000mg  in am or 500mg  twice daily to see if this helps with questionable pain.   Please make sure you are staying well hydrated. I recommend 50-60 ounces daily. Well balanced diet and regular exercise encouraged. Consistent sleep schedule with 6-8 hours recommended.   Please continue follow up with care team as directed.   Follow up with me in 6 months   You may receive a survey regarding today's visit. I encourage you to leave honest feed back as I do use this information to improve patient care. Thank you for seeing me today!    Management of Memory Problems   There are some general things you can do to help manage your memory problems.  Your memory may not in fact recover, but by using techniques and strategies you will be able to manage your memory difficulties better.   1)  Establish a routine. Try to establish and then stick to a regular routine.  By doing this, you will get used to what to expect and you will reduce the need to rely on your memory.  Also, try to do things at the same time of day, such as taking your medication or checking your calendar first thing in the morning. Think about think that you can do as a part of a regular routine and make a list.  Then enter them into a daily planner to remind you.  This will help you establish a routine.   2)  Organize your environment. Organize your environment so that it is uncluttered.  Decrease visual stimulation.  Place everyday items such as keys or cell phone in the same place every day (ie.  Basket next to front door) Use post it notes with a brief message to yourself (ie. Turn off light, lock the door) Use labels to indicate where things go (ie. Which cupboards are for food, dishes, etc.) Keep a notepad and pen by the telephone to take messages   3)  Memory Aids A diary or journal/notebook/daily planner Making a list (shopping list, chore list, to do  list that needs to be done) Using an alarm as a reminder (kitchen timer or cell phone alarm) Using cell phone to store information (Notes, Calendar, Reminders) Calendar/White board placed in a prominent position Post-it notes   In order for memory aids to be useful, you need to have good habits.  It's no good remembering to make a note in your journal if you don't remember to look in it.  Try setting aside a certain time of day to look in journal.   4)  Improving mood and managing fatigue. There may be other factors that contribute to memory difficulties.  Factors, such as anxiety, depression and tiredness can affect memory. Regular gentle exercise can help improve your mood and give you more energy. Exercise: there are short videos created by the [employer] on Health specially for older adults: https://bit.ly/2I30q97.  Mediterranean diet: which emphasizes fruits, vegetables, whole grains, legumes, fish, and other seafood; unsaturated fats such as olive oils; and low amounts of red meat, eggs, and sweets. A variation of this, called MIND (Mediterranean-DASH Intervention for Neurodegenerative Delay) incorporates the DASH (Dietary Approaches to Stop Hypertension) diet, which has been shown to lower high blood pressure, a risk factor for Alzheimer's disease. More information at: ExitMarketing.de.  Aerobic exercise that improve heart health is also good for the mind.  [employer] on Aging have short  videos for exercises that you can do at home: BlindWorkshop.com.pt Simple relaxation techniques may help relieve symptoms of anxiety Try to get back to completing activities or hobbies you enjoyed doing in the past. Learn to pace yourself through activities to decrease fatigue. Find out about some local support groups where you can share experiences with others. Try and achieve 7-8 hours of sleep at  night.   Resources for Family/Caregiver  Online caregiver support groups can be found at WesternTunes.it or call Alzheimer's Association's 24/7 hotline: (816)281-4525. [facility] Memory Counseling Program offers in-person, virtual support groups and individual counseling for both care partners and persons with memory loss. Call for more information at [phone].   Advanced care plan: there are two types of Power of Attorney: healthcare and durable. Healthcare POA is a designated person to make healthcare decisions on your behalf if you were too sick to make them yourself. This person can be selected and documented by your physician. Durable POA has to be set up with a lawyer who takes charge of your finances and estate if you were too sick or cognitively impaired to manage your finances accurately. You can find a local Elder Therapist, art here: [place].at.  Check out www.planyourlifespan.org, which will help you plan before a crisis and decide who will take care of life considerations in a circumstance where you may not be able to speak for yourself.   Helpful books (available on [employer] or your local bookstore):  By Dr. Carl Best: Keeping Love Alive as Memories Fade: The 5 Love Languages and the Alzheimer's Journey Nov 20, 2014 The Dementia Care Partner's Workbook: A Guide for Understanding, Education, and [employer] - July 20, 2017.  Both available for less than $15.   "Coping with behavior change in dementia: a family caregiver's guide" by Ricardo Jericho & Valora Piccolo "A Caregiver's Guide to Dementia: Using Activities and Other Strategies to Prevent, Reduce and Manage Behavioral Symptoms" by Mahala Menghini Gitlin and [employer].  Youth worker of Joy for the Person with Alzheimer's or Dementia" 4th edition by Tama High  Caregiver videos on common behaviors related to dementia: PopulationGame.pl  [place] Caregiver Portal: free to sign up,  links to local resources: https://[place]-caregivers.com/login

## 2021-12-25 NOTE — Progress Notes (Unsigned)
PATIENT: Alice Smith DOB: October 03, 1945  REASON FOR VISIT: follow up HISTORY FROM: patient  No chief complaint on file.    HISTORY OF PRESENT ILLNESS:  12/25/21 ALL:  Alice Smith returns for follow up for dementia. She continues donepezil '10mg'$  QHS and memantine '10mg'$  BID.   05/17/2021 ALL: Alice Smith returns for follow up for dementia. She continues Namenda and Aricept. Memory seems to have declined some. She is having more difficulty with conversations. She does not initiate conversation but usually able to participate if someone speaks to her first. She continues to need assistance with most ADLs. Able to get dressed. Husband helps with bathing but she is able to wash her hair. Appetite is good. They have some concerns of her binge eating if not supervised. She is sleeping really well.   She continues close follow up with PCP. She was seen yesterday for chronic cough, bowel incontinence, urinary frequency and recent fall. She was prescribed Hycodan and advised to continue Mucinex. Labs were unremarkable but unable to obtain UA. She wears depends.   11/16/2020 ALL:  Alice Smith returns for follow up for dementia. She continues Namenda and Aricept. Memory seems stable. No significant changes. She is sleeping well. Remains mostly independent with ADLs but does need help preparing meals and medications.  She had Covid about a month ago. She had mild symptoms. She took antiviral and recovered well. She continues to walk three times a day. She is followed closely by PCP.   05/17/2020 ALL:  Alice Smith returns for follow up for dementia. She continues Aricept and Namenda. She presents with Alice Smith, her daughter, who aids in history. Memory waxes and wanes. Seems happy but mostly confused. She does not complain of any pain unless an area (knees, toes) are manipulated. She needs to be reminded to try to use the restroom. Occasionally has accidents. She will not use public restroom. No UTIs. Family reminds her to  drink fluids. Miralax is helping with constipation. She showers independently. Some assistance with getting dressed. She will eat if food is provided but she rarely initiates meals. She will pick up a snack from the counter. She continues to walk 1-2 miles daily. No difficulty with gait. No falls. Has 7 children. Husband and children provide 24/7 supervision. Youngest son (of 7 children) still lives at home.   11/18/2019 ALL:  Alice Smith is a 76 y.o. female here today for follow up for dementia. She continues Aricept and Namenda. She is tolerating medications well. Mr Gittings is helping to dose medications. She continues ADL's independently. She is eating and drinking normally. She has had several episode of bowel incontinence. She is being treated for constipation. This has helped. She is not comfortable using public restrooms. Memory waxes and wanes. She continues to have lack of interest. She has a flat affect. No changes in movement. She is walking 2 miles a day. She continues to go to church.   HISTORY: (copied from my note on 02/23/2019)  Alice Smith is a 76 y.o. female here today for follow up for dementia. She continues Aricept '10mg'$  daily and Namenda '10mg'$  BID. She is tolerating medications well. Her husband is with her today and aids in history. He reports that memory is fairly stable. They have noticed some worsening with concentration. She is wanting to sit still more than usual. She continues to walk about 1 mile several times a week. She has had more urine and bowel incontinence. She has had two accidents over the past  6 months. She is wearing pads. She is eating well. She does seem to enjoy simple sugars. She is able to perform ADL's. She does not cook. She does not drive. No falls.    HISTORY: (copied from my note on 08/11/2018)   Alice Smith is a 76 y.o. female here today for follow up of dementia.  Alice Smith is accompanied by her husband, Mr. Bergevin, who aids in obtaining history.   He reports that overall she has been fairly stable since her last visit in June 2019.  Unfortunately there seems to be some confusion on what medication she was taking. At her last visit, she was instructed to continue Namenda 10 mg twice daily.  Dr. Leta Baptist added Aricept 10 mg daily (titrating from '5mg'$  daily x 1 week).  They are unsure how long she continued Namenda.  It appears that it was discontinued from her medication list.  Mr. Mast does not feel that she has taken this in some time.  She did tolerate the medication well.  She continues to perform all ADLs at home.  She is able to dress bathe and feed herself.  He reports that she can remember events from long ago but struggles with short-term information.  She does very rarely drive when accompanied by a family member.  Her husband reports that this is only short distances and he either rides with her or follows her.  He denies any falls or injuries.  No behavioral concerns at this time.   HISTORY (copied from Dr Gladstone Lighter note on 08/06/2017)    UPDATE (08/06/17, VRP): Since last visit, doing slightly worse. Tolerating memantine. No alleviating or aggravating factors. Driving less now. Some diff with shopping. MMSE 19/30. Ron Parker 6/6. [place] IADL 5/8.    PRIOR HPI (02/04/17): 76 year old left-handed female here for evaluation of cognitive decline, confusion, lack of emotions, difficulty processing information.   Family has noted at least 2 years of gradual onset progressive confusion, fatigue, decreased processing speed, comprehension difficulty, decreased interest in day-to-day activity specifically household chores and cleanliness, which she used to be quite involved with.  She is also shown flat and emotional affect, less nurturing, less social.  She has had trouble with maintaining her checkbook, finances, shopping and ordering.  In June 2018 she got lost in [place] when trying to pick up their grandson.   Overall patient having  decreased speech output and fluency.   Patient also underwent 7 or 8 surgeries in 2014, and family noted that many symptoms started at that time   REVIEW OF SYSTEMS: Out of a complete 14 system review of symptoms, the patient complains only of the following symptoms, memory loss, incontinence, cough, recent fall and all other reviewed systems are negative.   ALLERGIES: Allergies  Allergen Reactions   Clarithromycin     REACTION: nausea and vomiting   Erythromycin Ethylsuccinate     REACTION: GI upset   Fluticasone-Salmeterol     REACTION: Sore tongue and rash   Procaine Hcl     REACTION: swelling   Sulfonamide Derivatives     REACTION: rash   Topiramate     REACTION: confusion    HOME MEDICATIONS: Outpatient Medications Prior to Visit  Medication Sig Dispense Refill   Bacillus Coagulans-Inulin (PROBIOTIC) 1-250 BILLION-MG CAPS Take by mouth daily.     calcium-vitamin D (OSCAL WITH D) 500-200 MG-UNIT tablet Take 1 tablet by mouth daily.     Cholecalciferol (DIALYVITE VITAMIN D 5000) 125 MCG (5000 UT) capsule Take 1 capsule (  5,000 Units total) by mouth daily.     cyanocobalamin (,VITAMIN B-12,) 1000 MCG/ML injection Inject 1 mL (1,000 mcg total) into the muscle every 21 ( twenty-one) days. 1 mL    donepezil (ARICEPT) 10 MG tablet Take 1 tablet (10 mg total) by mouth at bedtime. 90 tablet 0   famotidine (PEPCID) 20 MG tablet Take 1 tablet (20 mg total) by mouth 2 (two) times daily. 180 tablet 1   hydrocortisone 2.5 % cream Apply to rash on neck and arms once to twice daily until improved. 30 g 3   loratadine (CLARITIN) 10 MG tablet Take 10 mg by mouth daily.     memantine (NAMENDA) 10 MG tablet Take 1 tablet (10 mg total) by mouth 2 (two) times daily. 180 tablet 1   No facility-administered medications prior to visit.    PAST MEDICAL HISTORY: Past Medical History:  Diagnosis Date   Arthritis    Breast mass 09/2421   benign   Complication of anesthesia    slow to wake up x  1   Concussion 2014, 05/2016   Dry cough    at night   Hemorrhoids    Migraine    evaluated with Dr. Domingo Cocking 11/2009   Osteoporosis     PAST SURGICAL HISTORY: Past Surgical History:  Procedure Laterality Date   ABDOMINAL EXPLORATION SURGERY     for twisted bowel   ABDOMINAL HYSTERECTOMY     yrs ago   Tat Momoli   aspirated lump left breast   CARPAL TUNNEL RELEASE     right 03/2001, left 07/2001   ESOPHAGOGASTRODUODENOSCOPY     KNEE ARTHROSCOPY     rt knee   nissenfundiplication     PARTIAL HYSTERECTOMY  1989   dyspasia, ovaries left in   PARTIAL KNEE ARTHROPLASTY Right 06/23/2013   Procedure: RIGHT KNEE MEDIAL UNICOMPARTMENTAL ARTHROPLASTY;  Surgeon: Mauri Pole, MD;  Location: [facility];  Service: Orthopedics;  Laterality: Right;   PARTIAL KNEE ARTHROPLASTY Left 08/31/2013   Procedure: LEFT UNICOMPARTMENTAL KNEE ATHROPLASTY   (MEDIAL) ;  Surgeon: Mauri Pole, MD;  Location: [facility];  Service: Orthopedics;  Laterality: Left;   ROTATOR CUFF REPAIR     right 03/2001, left 07/2001    FAMILY HISTORY: Family History  Problem Relation Age of Onset   Arthritis Mother    Cancer Mother        lung cancer, dx'd in her 84s   Cancer Father        throat   COPD Brother    Cancer Sister        breast   Breast cancer Sister    Breast cancer Sister    Colon cancer Neg Hx     SOCIAL HISTORY: Social History   Socioeconomic History   Marital status: Married    Spouse name: Not on file   Number of children: 3   Years of education: Not on file   Highest education level: Not on file  Occupational History   Occupation: home  Tobacco Use   Smoking status: Former    Packs/day: 1.00    Years: 5.00    Total pack years: 5.00    Types: Cigarettes    Quit date: 06/17/1978    Years since quitting: 43.5   Smokeless tobacco: Never   Tobacco comments:    5 pack year history  Substance and Sexual Activity   Alcohol use: No   Drug use: No   Sexual activity: Not on  file   Other Topics Concern   Not on file  Social History Narrative   Second marriage, 46. Has 3 adult children, adopted 4, 6 grandkids.   From [place], [place]   Education- GED, some college   Social Determinants of [place] Strain: Low Risk  (07/06/2019)   Overall Financial Resource Strain (CARDIA)    Difficulty of Paying Living Expenses: Not hard at all  Food Insecurity: No Food Insecurity (07/06/2019)   Hunger Vital Sign    Worried About Running Out of Food in the Last Year: Never true    [place] in the Last Year: Never true  Transportation Needs: No Transportation Needs (07/06/2019)   PRAPARE - Hydrologist (Medical): No    Lack of Transportation (Non-Medical): No  Physical Activity: Sufficiently Active (07/06/2019)   Exercise Vital Sign    Days of Exercise per Week: 3 days    Minutes of Exercise per Session: 50 min  Stress: No Stress Concern Present (07/06/2019)   [place]    Feeling of Stress : Not at all  Social Connections: Not on file  Intimate Partner Violence: Not At Risk (07/06/2019)   Humiliation, Afraid, Rape, and Kick questionnaire    Fear of Current or Ex-Partner: No    Emotionally Abused: No    Physically Abused: No    Sexually Abused: No      PHYSICAL EXAM  There were no vitals filed for this visit.    There is no height or weight on file to calculate BMI.  Generalized: Well developed, in no acute distress, flat affect  Cardiology: normal rate and rhythm, no murmur noted Respiratory: diffuse wheezing of upper and lower lobes, bilaterally, with auscultation of posterior lung fields. No acute dyspnea. Patient coughs throughout visit. Nonproductive. O2 99%.  Neurological examination  Mentation: Alert, Unable to complete MMSE, not oriented to time or place, not able to assist with history taking. Follows all commands speech and language  fluent.  Cranial nerve II-XII: Pupils were equal round reactive to light. Extraocular movements were full, visual field were full on confrontational test. Facial sensation and strength were normal. Head turning and shoulder shrug  were normal and symmetric. Motor: The motor testing reveals 5 over 5 strength of all 4 extremities. Good symmetric motor tone is noted throughout. No bradykinesia noted. No cogwheel rigidity.  Unable to test coordination.  Gait and station: Gait is normal. Normal posture.    DIAGNOSTIC DATA (LABS, IMAGING, TESTING) - I reviewed patient records, labs, notes, testing and imaging myself where available.     05/17/2020    2:33 PM 11/18/2019    1:56 PM 07/06/2019    9:04 AM  MMSE - Mini Mental State Exam  Not completed: Unable to complete  Refused  Orientation to time  1   Orientation to Place  2   Registration  3   Attention/ Calculation  0   Recall  2   Language- name 2 objects  2   Language- repeat  1   Language- follow 3 step command  3   Language- read & follow direction  0   Write a sentence  0   Copy design  0   Total score  14      Lab Results  Component Value Date   WBC 5.7 05/16/2021   HGB 14.4 05/16/2021   HCT 42.8 05/16/2021   MCV 92.3  05/16/2021   PLT 205.0 05/16/2021      Component Value Date/Time   NA 140 05/16/2021 1053   K 3.8 05/16/2021 1053   CL 104 05/16/2021 1053   CO2 27 05/16/2021 1053   GLUCOSE 92 05/16/2021 1053   BUN 13 05/16/2021 1053   CREATININE 0.84 05/16/2021 1053   CREATININE 0.81 07/25/2018 1527   CALCIUM 9.2 05/16/2021 1053   PROT 6.7 05/16/2021 1053   ALBUMIN 4.0 05/16/2021 1053   AST 23 05/16/2021 1053   ALT 19 05/16/2021 1053   ALKPHOS 95 05/16/2021 1053   BILITOT 0.8 05/16/2021 1053   GFRNONAA 90 (L) 09/01/2013 0452   GFRAA >90 09/01/2013 0452   Lab Results  Component Value Date   CHOL 216 (H) 03/24/2021   HDL 50.10 03/24/2021   LDLCALC 132 (H) 03/24/2021   LDLDIRECT 129.8 10/01/2011   TRIG 170.0  (H) 03/24/2021   CHOLHDL 4 03/24/2021   No results found for: "HGBA1C" Lab Results  Component Value Date   VITAMINB12 796 09/27/2021   Lab Results  Component Value Date   TSH 6.74 (H) 03/24/2021       ASSESSMENT AND PLAN 76 y.o. year old female  has a past medical history of Arthritis, Breast mass (02/624), Complication of anesthesia, Concussion (2014, 05/2016), Dry cough, Hemorrhoids, Migraine, and Osteoporosis. here with   No diagnosis found.    Amil is doing fairly well from memory standpoint. Family has noticed some decline in conversation. Long term memory still very much intact.. We will continue Aricept and Namenda as prescribed. I have discussed clinical progression and disease process with her daughter. We will focus on safety and comfort. I have asked her to let Dr Damita Dunnings know, today at B12 visit, that wheezing seems to have worsened since visit yesterday. Xray was reassuring. Hycodan did help some last night but cough is fairly constant with audible wheezing in the room. Vital signs stable. No obvious difficult breathing an no reported chest pain. She has significant history of seasonal allergies/asthma. She was encouraged to continue healthy lifestyle habits. She will continue close follow up with PCP. She will stay active. She will follow up with me in 6-12 months.    No orders of the defined types were placed in this encounter.     No orders of the defined types were placed in this encounter.   I spent 30 minutes of face-to-face and non-face-to-face time with patient.  This included previsit chart review, lab review, study review, order entry, electronic health record documentation, patient education.    Debbora Presto, FNP-C 12/25/2021, 8:02 AM [facility] [address], [place], [place] [address] [phone]

## 2021-12-28 ENCOUNTER — Encounter: Payer: Self-pay | Admitting: Family Medicine

## 2021-12-28 ENCOUNTER — Ambulatory Visit: Payer: Medicare Other | Admitting: Family Medicine

## 2021-12-28 VITALS — BP 130/65 | HR 66 | Ht 63.0 in | Wt 191.0 lb

## 2021-12-28 DIAGNOSIS — F03B0 Unspecified dementia, moderate, without behavioral disturbance, psychotic disturbance, mood disturbance, and anxiety: Secondary | ICD-10-CM

## 2022-01-03 ENCOUNTER — Ambulatory Visit: Payer: Medicare Other

## 2022-01-04 ENCOUNTER — Telehealth: Payer: Self-pay

## 2022-01-04 NOTE — Telephone Encounter (Signed)
[facility] Night - Client Nonclinical Telephone Record  [facility] Night - Client Client Site [facility] [place] - Night Provider Renford Dills - MD Contact Type Call Who Is Calling Patient / Member / Family / Caregiver Caller Name Mirka Barbone Caller Phone Number [phone] Patient Name Alice Smith Patient DOB November 12, 1945 Call Type Message Only Information Provided Reason for Call Request to Schedule Office Appointment Initial Comment Caller states his wife missed appt. for B12 injection. Needs to reschedule. Patient request to speak to RN No Disp. Time Disposition Final User 01/03/2022 5:26:07 PM General Information Provided Yes Idolina Primer Call Closed By: Idolina Primer Transaction Date/Time: 01/03/2022 5:23:10 PM (ET

## 2022-01-04 NOTE — Telephone Encounter (Signed)
Patient has been rescheduled.

## 2022-01-09 ENCOUNTER — Ambulatory Visit (INDEPENDENT_AMBULATORY_CARE_PROVIDER_SITE_OTHER): Payer: Medicare Other

## 2022-01-09 DIAGNOSIS — E538 Deficiency of other specified B group vitamins: Secondary | ICD-10-CM

## 2022-01-09 MED ORDER — CYANOCOBALAMIN 1000 MCG/ML IJ SOLN
1000.0000 ug | Freq: Once | INTRAMUSCULAR | Status: AC
  Administered 2022-01-09: 1000 ug via INTRAMUSCULAR

## 2022-01-09 NOTE — Progress Notes (Signed)
Per orders of Dr. Elsie Stain, injection of Vitamin B 12 in right deltoid given by Ozzie Hoyle. Patient tolerated injection well.

## 2022-02-14 ENCOUNTER — Other Ambulatory Visit: Payer: Self-pay

## 2022-02-14 ENCOUNTER — Ambulatory Visit (INDEPENDENT_AMBULATORY_CARE_PROVIDER_SITE_OTHER): Payer: Medicare Other

## 2022-02-14 DIAGNOSIS — E538 Deficiency of other specified B group vitamins: Secondary | ICD-10-CM

## 2022-02-14 DIAGNOSIS — F03B0 Unspecified dementia, moderate, without behavioral disturbance, psychotic disturbance, mood disturbance, and anxiety: Secondary | ICD-10-CM

## 2022-02-14 MED ORDER — DONEPEZIL HCL 10 MG PO TABS: 10.0000 mg | ORAL_TABLET | Freq: Every day | ORAL | 0 refills | Status: DC

## 2022-02-14 MED ORDER — CYANOCOBALAMIN 1000 MCG/ML IJ SOLN
1000.0000 ug | Freq: Once | INTRAMUSCULAR | Status: AC
  Administered 2022-02-14: 1000 ug via INTRAMUSCULAR

## 2022-02-14 NOTE — Progress Notes (Signed)
Per orders of Dr. Ivy Lynn, injection of Vitamin B 12 given by Ozzie Hoyle. Patient tolerated injection well.

## 2022-02-21 ENCOUNTER — Other Ambulatory Visit: Payer: Self-pay | Admitting: *Deleted

## 2022-02-21 NOTE — Telephone Encounter (Signed)
Rx for Namenda 10 mg tablet was sent to pharmacy with refills on 12/13/21.

## 2022-03-05 ENCOUNTER — Other Ambulatory Visit: Payer: Self-pay

## 2022-03-05 MED ORDER — MEMANTINE HCL 10 MG PO TABS: 10.0000 mg | ORAL_TABLET | Freq: Two times a day (BID) | ORAL | 3 refills | Status: DC

## 2022-03-07 ENCOUNTER — Ambulatory Visit (INDEPENDENT_AMBULATORY_CARE_PROVIDER_SITE_OTHER): Payer: Medicare Other

## 2022-03-07 DIAGNOSIS — E538 Deficiency of other specified B group vitamins: Secondary | ICD-10-CM

## 2022-03-07 MED ORDER — CYANOCOBALAMIN 1000 MCG/ML IJ SOLN
1000.0000 ug | Freq: Once | INTRAMUSCULAR | Status: AC
  Administered 2022-03-07: 1000 ug via INTRAMUSCULAR

## 2022-03-07 NOTE — Progress Notes (Signed)
Patient presented for B 12 injection given by Sherrilee Gilles, CMA to right deltoid, patient voiced no concerns nor showed any signs of distress during injection.

## 2022-03-08 ENCOUNTER — Other Ambulatory Visit: Payer: Self-pay | Admitting: *Deleted

## 2022-03-08 MED ORDER — MEMANTINE HCL 10 MG PO TABS: 10.0000 mg | ORAL_TABLET | Freq: Two times a day (BID) | ORAL | 3 refills | Status: DC

## 2022-03-08 NOTE — Telephone Encounter (Signed)
Follow up appointment scheduled on 07/05/22. Rx sent to mail order.

## 2022-03-29 ENCOUNTER — Ambulatory Visit (INDEPENDENT_AMBULATORY_CARE_PROVIDER_SITE_OTHER): Payer: Medicare Other | Admitting: Family Medicine

## 2022-03-29 ENCOUNTER — Encounter: Payer: Self-pay | Admitting: Family Medicine

## 2022-03-29 VITALS — BP 124/80 | HR 72 | Temp 96.6°F | Ht 63.0 in | Wt 186.0 lb

## 2022-03-29 DIAGNOSIS — K649 Unspecified hemorrhoids: Secondary | ICD-10-CM | POA: Diagnosis not present

## 2022-03-29 DIAGNOSIS — Z23 Encounter for immunization: Secondary | ICD-10-CM

## 2022-03-29 DIAGNOSIS — E538 Deficiency of other specified B group vitamins: Secondary | ICD-10-CM

## 2022-03-29 MED ORDER — CYANOCOBALAMIN 1000 MCG/ML IJ SOLN
1000.0000 ug | Freq: Once | INTRAMUSCULAR | Status: AC
  Administered 2022-03-29: 1000 ug via INTRAMUSCULAR

## 2022-03-29 NOTE — Patient Instructions (Addendum)
Use prep H as needed and update me as needed.  Take care.  Glad to see you.

## 2022-03-29 NOTE — Progress Notes (Signed)
Spouse had prostate surgery this week.  Here today with daughter.   H/o memory loss with bladder and bowel incontinence. Using depends at baseline.  H/o hemorrhoids.  Family noted hemorrhoid this week.    Patient wasn't complaining about itching or pain but hx is limited based on memory changes.  No blood seen in stool.    Discussed consideration for bone density and mammogram testing.  If the patient would not go through treatment for an abnormal result, then it would not make sense to go through the testing.  Patient and daughter and family can consider this in the meantime and let me know which way they want to go, given her memory loss.  We talked about routine vaccination.  Meds, vitals, and allergies reviewed.   ROS: Per HPI unless specifically indicated in ROS section   Nad Ncat Patient is able to follow commands.  She speaks 1-2 words at a time.  She is responsive but does not engage in conversation.  This is her baseline given her history of memory loss.  She needs to be directed during conversation. Neck supple, no LA Rrr ctab Soft external hemorrhoid w/o gross blood.  No fissure seen.  Skin is in good condition. Chaperoned exam.

## 2022-03-30 ENCOUNTER — Telehealth: Payer: Self-pay | Admitting: Family Medicine

## 2022-03-30 NOTE — Telephone Encounter (Signed)
Patient daughter Alice Smith called in and wanted to follow up on the cream that Dr. Damita Dunnings was suppose to send in for [facility]. She would like a phone call when this is sent in. Please advise. Thank you!

## 2022-03-30 NOTE — Telephone Encounter (Signed)
Dr. Damita Dunnings advised patient to use otc preparation H cream. Called daughter back and let her know. She was okay with this and will let us know if anything else is needed.

## 2022-04-01 NOTE — Assessment & Plan Note (Signed)
Since the hemorrhoid is soft and not bleeding I think it would be inappropriate to remove it.  Her skin is in good condition and intervening at this point may be counterproductive.  I would use Preparation H as needed.  Her family is doing a good job of helping her maintain skin integrity.  Update me as needed.  We talked about hemorrhoid anatomy in general.

## 2022-04-04 ENCOUNTER — Telehealth: Payer: Self-pay

## 2022-04-04 NOTE — Patient Outreach (Signed)
  Care Coordination   Initial Visit Note   04/04/2022 Name: Alice Smith MRN: [id number] DOB: 08-Nov-1945  Alice Smith is a 77 y.o. year old female who sees Tonia Ghent, MD for primary care. I  spoke with Joanna Puff, daughter/ dpr.   What matters to the patients health and wellness today?  Daughter verbalized no nursing or community resource needs at this time. Daughter states patient has dementia and she knows she will probably need services in the future.     Goals Addressed             This Visit's Progress    care coordination activities - no follow up needed       Interventions Today    Flowsheet Row Most Recent Value  Chronic Disease   Chronic disease during today's visit Other  [Dementia]  General Interventions   General Interventions Discussed/Reviewed General Interventions Reviewed  [Care coordination program/ services discussed.  SDOH survey completed.  Caregiver advised to contact PCP office if care coordinations services needed in the future.]            SDOH assessments and interventions completed:  Yes  SDOH Interventions Today    Flowsheet Row Most Recent Value  SDOH Interventions   Food Insecurity Interventions Intervention Not Indicated  Housing Interventions Intervention Not Indicated  Transportation Interventions Intervention Not Indicated        Care Coordination Interventions:  Yes, provided   Follow up plan: No further intervention required.   Encounter Outcome:  Pt. Visit Completed   Quinn Plowman RN,BSN,CCM [place] [phone] direct line

## 2022-04-09 ENCOUNTER — Ambulatory Visit (INDEPENDENT_AMBULATORY_CARE_PROVIDER_SITE_OTHER): Payer: Medicare Other | Admitting: Dermatology

## 2022-04-09 ENCOUNTER — Other Ambulatory Visit: Payer: Self-pay | Admitting: Dermatology

## 2022-04-09 DIAGNOSIS — D3617 Benign neoplasm of peripheral nerves and autonomic nervous system of trunk, unspecified: Secondary | ICD-10-CM | POA: Diagnosis not present

## 2022-04-09 DIAGNOSIS — L578 Other skin changes due to chronic exposure to nonionizing radiation: Secondary | ICD-10-CM | POA: Diagnosis not present

## 2022-04-09 DIAGNOSIS — D485 Neoplasm of uncertain behavior of skin: Secondary | ICD-10-CM

## 2022-04-09 DIAGNOSIS — L309 Dermatitis, unspecified: Secondary | ICD-10-CM

## 2022-04-09 DIAGNOSIS — D229 Melanocytic nevi, unspecified: Secondary | ICD-10-CM

## 2022-04-09 DIAGNOSIS — D492 Neoplasm of unspecified behavior of bone, soft tissue, and skin: Secondary | ICD-10-CM

## 2022-04-09 DIAGNOSIS — L814 Other melanin hyperpigmentation: Secondary | ICD-10-CM | POA: Diagnosis not present

## 2022-04-09 DIAGNOSIS — L821 Other seborrheic keratosis: Secondary | ICD-10-CM

## 2022-04-09 DIAGNOSIS — Z1283 Encounter for screening for malignant neoplasm of skin: Secondary | ICD-10-CM

## 2022-04-09 DIAGNOSIS — D225 Melanocytic nevi of trunk: Secondary | ICD-10-CM | POA: Diagnosis not present

## 2022-04-09 MED ORDER — HYDROCORTISONE 2.5 % EX CREA: TOPICAL_CREAM | Freq: Two times a day (BID) | CUTANEOUS | 2 refills | Status: DC | PRN

## 2022-04-09 NOTE — Progress Notes (Unsigned)
Follow-Up Visit   Subjective  Alice Smith is a 77 y.o. female who presents for the following: Total body skin exam.  Patient accompanied by husband and daughter. Pt has dementia.  The patient presents for Total-Body Skin Exam (TBSE) for skin cancer screening and mole check.  The patient has spots, moles and lesions to be evaluated, some may be new or changing and the patient has concerns that these could be cancer.   The following portions of the chart were reviewed this encounter and updated as appropriate:       Review of Systems:  No other skin or systemic complaints except as noted in HPI or Assessment and Plan.  Objective  Well appearing patient in no apparent distress; mood and affect are within normal limits.  A full examination was performed including scalp, head, eyes, ears, nose, lips, neck, chest, axillae, abdomen, back, buttocks, bilateral upper extremities, bilateral lower extremities, hands, feet, fingers, toes, fingernails, and toenails. All findings within normal limits unless otherwise noted below.  Left Lateral Lower Back  Left Lateral Lower Back 6.26m med brown macule, Slightly irregular pigment     L dorsum hand/wrist Pink excoriated patch L hand and wrist, pink scaly patches R pretibia  spinal upper back 6.055mpink pearly pap       Assessment & Plan   Lentigines - Scattered tan macules - Due to sun exposure - Benign-appearing, observe - Recommend daily broad spectrum sunscreen SPF 30+ to sun-exposed areas, reapply every 2 hours as needed. - Call for any changes - face  Seborrheic Keratoses - Stuck-on, waxy, tan-brown papules and/or plaques  - Benign-appearing - Discussed benign etiology and prognosis. - Observe - Call for any changes - nose, trunk  Melanocytic Nevi - Tan-brown and/or pink-flesh-colored symmetric macules and papules - Benign appearing on exam today - Observation - Call clinic for new or changing moles -  Recommend daily use of broad spectrum spf 30+ sunscreen to sun-exposed areas.   Hemangiomas - Red papules - Discussed benign nature - Observe - Call for any changes - trunk  Actinic Damage - Chronic condition, secondary to cumulative UV/sun exposure - diffuse scaly erythematous macules with underlying dyspigmentation - Recommend daily broad spectrum sunscreen SPF 30+ to sun-exposed areas, reapply every 2 hours as needed.  - Staying in the shade or wearing long sleeves, sun glasses (UVA+UVB protection) and wide brim hats (4-inch brim around the entire circumference of the hat) are also recommended for sun protection.  - Call for new or changing lesions. - chest  Skin cancer screening performed today.   Milia - tiny firm white papules - type of cyst - benign - may be extracted if symptomatic - observe  - face  Nevus Left Lateral Lower Back  Slight irregular nevus, Stable since 2/22, no changes Discussed bx/shave removal vs observation, pts daughter would prefer observation.  Call clinic for new or changing lesions.  Recommend daily use of broad spectrum spf 30+ sunscreen to sun-exposed areas.    Dermatitis L dorsum hand/wrist  Start HC 2.5% cr qd/bid to aa eczema hands/arms, legs  Recommend mild soap and moisturizing cream 1-2 times daily.  Gentle skin care handout provided.    Topical steroids (such as triamcinolone, fluocinolone, fluocinonide, mometasone, clobetasol, halobetasol, betamethasone, hydrocortisone) can cause thinning and lightening of the skin if they are used for too long in the same area. Your physician has selected the right strength medicine for your problem and area affected on the body. Please use  your medication only as directed by your physician to prevent side effects.    hydrocortisone 2.5 % cream - L dorsum hand/wrist Apply topically 2 (two) times daily as needed (Rash). Qd to bid aa eczema on hands, arms, legs until clear, then prn flares  Neoplasm  of skin spinal upper back  Epidermal / dermal shaving  Lesion diameter (cm):  0.6 Informed consent: discussed and consent obtained   Patient was prepped and draped in usual sterile fashion: area prepped with alcohol. Anesthesia: the lesion was anesthetized in a standard fashion   Anesthetic:  1% lidocaine w/ epinephrine 1-100,000 buffered w/ 8.4% NaHCO3 Instrument used: flexible razor blade   Hemostasis achieved with: pressure, aluminum chloride and electrodesiccation   Outcome: patient tolerated procedure well    Destruction of lesion  Destruction method: electrodesiccation and curettage   Informed consent: discussed and consent obtained   Curettage performed in three different directions: Yes   Electrodesiccation performed over the curetted area: Yes   Lesion length (cm):  0.6 Lesion width (cm):  0.6 Final wound size (cm):  0.9 Hemostasis achieved with:  pressure, aluminum chloride and electrodesiccation Outcome: patient tolerated procedure well with no complications   Post-procedure details: wound care instructions given   Additional details:  Mupirocin ointment and Bandaid applied   Specimen 1 - Surgical pathology Differential Diagnosis: D48.5 R/O BCC  Check Margins: yes 6.37m pink pearly pap EDC   Return in about 1 year (around 04/10/2023) for TBSE.  I, SOthelia Pulling RMA, am acting as scribe for TBrendolyn Patty MD .  Documentation: I have reviewed the above documentation for accuracy and completeness, and I agree with the above.  TBrendolyn PattyMD

## 2022-04-09 NOTE — Patient Instructions (Addendum)
Melanoma ABCDEs  Melanoma is the most dangerous type of skin cancer, and is the leading cause of death from skin disease.  You are more likely to develop melanoma if you: Have light-colored skin, light-colored eyes, or red or blond hair Spend a lot of time in the sun Tan regularly, either outdoors or in a tanning bed Have had blistering sunburns, especially during childhood Have a close family member who has had a melanoma Have atypical moles or large birthmarks  Early detection of melanoma is key since treatment is typically straightforward and cure rates are extremely high if we catch it early.   The first sign of melanoma is often a change in a mole or a new dark spot.  The ABCDE system is a way of remembering the signs of melanoma.  A for asymmetry:  The two halves do not match. B for border:  The edges of the growth are irregular. C for color:  A mixture of colors are present instead of an even brown color. D for diameter:  Melanomas are usually (but not always) greater than 48m - the size of a pencil eraser. E for evolution:  The spot keeps changing in size, shape, and color.  Please check your skin once per month between visits. You can use a small mirror in front and a large mirror behind you to keep an eye on the back side or your body.   If you see any new or changing lesions before your next follow-up, please call to schedule a visit.  Please continue daily skin protection including broad spectrum sunscreen SPF 30+ to sun-exposed areas, reapplying every 2 hours as needed when you're outdoors.   Staying in the shade or wearing long sleeves, sun glasses (UVA+UVB protection) and wide brim hats (4-inch brim around the entire circumference of the hat) are also recommended for sun protection.     Wound Care Instructions  Cleanse wound gently with soap and water once a day then pat dry with clean gauze. Apply a thin coat of Petrolatum (petroleum jelly, "Vaseline") over the wound  (unless you have an allergy to this). We recommend that you use a new, sterile tube of Vaseline. Do not pick or remove scabs. Do not remove the yellow or white "healing tissue" from the base of the wound.  Cover the wound with fresh, clean, nonstick gauze and secure with paper tape. You may use Band-Aids in place of gauze and tape if the wound is small enough, but would recommend trimming much of the tape off as there is often too much. Sometimes Band-Aids can irritate the skin.  You should call the office for your biopsy report after 1 week if you have not already been contacted.  If you experience any problems, such as abnormal amounts of bleeding, swelling, significant bruising, significant pain, or evidence of infection, please call the office immediately.  FOR ADULT SURGERY PATIENTS: If you need something for pain relief you may take 1 extra strength Tylenol (acetaminophen) AND 2 Ibuprofen (2041meach) together every 4 hours as needed for pain. (do not take these if you are allergic to them or if you have a reason you should not take them.) Typically, you may only need pain medication for 1 to 3 days.      Due to recent changes in healthcare laws, you may see results of your pathology and/or laboratory studies on MyChart before the doctors have had a chance to review them. We understand that in some cases there  may be results that are confusing or concerning to you. Please understand that not all results are received at the same time and often the doctors may need to interpret multiple results in order to provide you with the best plan of care or course of treatment. Therefore, we ask that you please give [place] 2 business days to thoroughly review all your results before contacting the office for clarification. Should we see a critical lab result, you will be contacted sooner.   If You Need Anything After Your Visit  If you have any questions or concerns for your doctor, please call our main line  at [phone] and press option 4 to reach your doctor's medical assistant. If no one answers, please leave a voicemail as directed and we will return your call as soon as possible. Messages left after 4 pm will be answered the following business day.   You may also send [place] a message via [place]. We typically respond to MyChart messages within 1-2 business days.  For prescription refills, please ask your pharmacy to contact our office. Our fax number is [phone].  If you have an urgent issue when the clinic is closed that cannot wait until the next business day, you can page your doctor at the number below.    Please note that while we do our best to be available for urgent issues outside of office hours, we are not available 24/7.   If you have an urgent issue and are unable to reach [place], you may choose to seek medical care at your doctor's office, retail clinic, urgent care center, or emergency room.  If you have a medical emergency, please immediately call 911 or go to the emergency department.  Pager Numbers  - Dr. Nehemiah Massed: [phone]  - Dr. Laurence Ferrari: [phone]  - Dr. Nicole Kindred: [phone]  In the event of inclement weather, please call our main line at [phone] for an update on the status of any delays or closures.  Dermatology Medication Tips: Please keep the boxes that topical medications come in in order to help keep track of the instructions about where and how to use these. Pharmacies typically print the medication instructions only on the boxes and not directly on the medication tubes.   If your medication is too expensive, please contact our office at [phone] option 4 or send [place] a message through [place].   We are unable to tell what your co-pay for medications will be in advance as this is different depending on your insurance coverage. However, we may be able to find a substitute medication at lower cost or fill out paperwork to get insurance to cover a  needed medication.   If a prior authorization is required to get your medication covered by your insurance company, please allow [place] 1-2 business days to complete this process.  Drug prices often vary depending on where the prescription is filled and some pharmacies may offer cheaper prices.  The website www.goodrx.com contains coupons for medications through different pharmacies. The prices here do not account for what the cost may be with help from insurance (it may be cheaper with your insurance), but the website can give you the price if you did not use any insurance.  - You can print the associated coupon and take it with your prescription to the pharmacy.  - You may also stop by our office during regular business hours and pick up a GoodRx coupon card.  - If you need your prescription sent electronically to  a different pharmacy, notify our office through [facility] MyChart or by phone at [phone] option 4.     Si Usted Necesita Algo Despus de Su Visita  Tambin puede enviarnos un mensaje a travs de MyChart. Por lo general respondemos a los mensajes de MyChart en el transcurso de 1 a 2 das hbiles.  Para renovar recetas, por favor pida a su farmacia que se ponga en contacto con nuestra oficina. Nuestro nmero de fax es el [phone].  Si tiene un asunto urgente cuando la clnica est cerrada y que no puede esperar hasta el siguiente da hbil, puede llamar/localizar a su doctor(a) al nmero que aparece a continuacin.   Por favor, tenga en cuenta que aunque hacemos todo lo posible para estar disponibles para asuntos urgentes fuera del horario de oficina, no estamos disponibles las 24 horas del da, los 7 das de la semana.   Si tiene un problema urgente y no puede comunicarse con nosotros, puede optar por buscar atencin mdica  en el consultorio de su doctor(a), en una clnica privada, en un centro de atencin urgente o en una sala de emergencias.  Si tiene una emergencia  mdica, por favor llame inmediatamente al 911 o vaya a la sala de emergencias.  Nmeros de bper  - Dr. Kowalski: [phone]  - Dra. Moye: [phone]  - Dra. Stewart: [phone]  En caso de inclemencias del tiempo, por favor llame a nuestra lnea principal al [phone] para una actualizacin sobre el estado de cualquier retraso o cierre.  Consejos para la medicacin en dermatologa: Por favor, guarde las cajas en las que vienen los medicamentos de uso tpico para ayudarle a seguir las instrucciones sobre dnde y cmo usarlos. Las farmacias generalmente imprimen las instrucciones del medicamento slo en las cajas y no directamente en los tubos del medicamento.   Si su medicamento es muy caro, por favor, pngase en contacto con nuestra oficina llamando al [phone] y presione la opcin 4 o envenos un mensaje a travs de MyChart.   No podemos decirle cul ser su copago por los medicamentos por adelantado ya que esto es diferente dependiendo de la cobertura de su seguro. Sin embargo, es posible que podamos encontrar un medicamento sustituto a menor costo o llenar un formulario para que el seguro cubra el medicamento que se considera necesario.   Si se requiere una autorizacin previa para que su compaa de seguros cubra su medicamento, por favor permtanos de 1 a 2 das hbiles para completar este proceso.  Los precios de los medicamentos varan con frecuencia dependiendo del lugar de dnde se surte la receta y alguna farmacias pueden ofrecer precios ms baratos.  El sitio web www.goodrx.com tiene cupones para medicamentos de diferentes farmacias. Los precios aqu no tienen en cuenta lo que podra costar con la ayuda del seguro (puede ser ms barato con su seguro), pero el sitio web puede darle el precio si no utiliz ningn seguro.  - Puede imprimir el cupn correspondiente y llevarlo con su receta a la farmacia.  - Tambin puede pasar por nuestra oficina durante el horario de  atencin regular y recoger una tarjeta de cupones de GoodRx.  - Si necesita que su receta se enve electrnicamente a una farmacia diferente, informe a nuestra oficina a travs de MyChart de [facility] o por telfono llamando al [phone] y presione la opcin 4.  

## 2022-04-10 ENCOUNTER — Telehealth: Payer: Self-pay

## 2022-04-10 ENCOUNTER — Ambulatory Visit: Payer: Medicare Other

## 2022-04-10 NOTE — Telephone Encounter (Signed)
Left message for patients daughter, [name], to call for bx results./sh

## 2022-04-10 NOTE — Telephone Encounter (Signed)
-----   Message from Brendolyn Patty, MD sent at 04/10/2022  5:42 PM EST ----- Skin , spinal upper back NEUROFIBROMA, EXCORIATED, BASE INVOLVED   Benign growth of nerve tissue, scratched and irritated- please call patient

## 2022-04-11 ENCOUNTER — Telehealth: Payer: Self-pay

## 2022-04-11 NOTE — Telephone Encounter (Signed)
-----   Message from Brendolyn Patty, MD sent at 04/10/2022  5:42 PM EST ----- Skin , spinal upper back NEUROFIBROMA, EXCORIATED, BASE INVOLVED   Benign growth of nerve tissue, scratched and irritated- please call patient

## 2022-04-11 NOTE — Telephone Encounter (Signed)
Patient's daughter advised of BX results. aw

## 2022-04-24 ENCOUNTER — Ambulatory Visit (INDEPENDENT_AMBULATORY_CARE_PROVIDER_SITE_OTHER): Payer: Medicare Other | Admitting: *Deleted

## 2022-04-24 DIAGNOSIS — E538 Deficiency of other specified B group vitamins: Secondary | ICD-10-CM

## 2022-04-24 MED ORDER — CYANOCOBALAMIN 1000 MCG/ML IJ SOLN
1000.0000 ug | Freq: Once | INTRAMUSCULAR | Status: AC
  Administered 2022-04-24: 1000 ug via INTRAMUSCULAR

## 2022-04-24 NOTE — Progress Notes (Signed)
Per orders of Dr. Damita Dunnings, injection of Vitamin B 12  given by Emelia Salisbury LPN Patient tolerated injection well.

## 2022-04-25 ENCOUNTER — Ambulatory Visit (INDEPENDENT_AMBULATORY_CARE_PROVIDER_SITE_OTHER): Payer: Medicare Other

## 2022-04-25 VITALS — Ht 60.0 in | Wt 184.0 lb

## 2022-04-25 DIAGNOSIS — Z0000 Encounter for general adult medical examination without abnormal findings: Secondary | ICD-10-CM

## 2022-04-25 NOTE — Progress Notes (Signed)
I connected with  daughter Alice Smith on behalf of Alice Smith 04/25/22 by a audio enabled telemedicine application and verified that I am speaking with the correct person using two identifiers.  Patient Location: Home  Provider Location: Home Office  I discussed the limitations of evaluation and management by telemedicine. The patient expressed understanding and agreed to proceed.  Subjective:   Alice Smith is a 77 y.o. female who presents for Medicare Annual (Subsequent) preventive examination.  Review of Systems     Cardiac Risk Factors include: advanced age (>72mn, >>24women);sedentary lifestyle;dyslipidemia     Objective:    Today's Vitals   04/25/22 1115  Weight: 184 lb (83.5 kg)  Height: 5' (1.524 m)   Body mass index is 35.94 kg/m.     04/25/2022   11:26 AM 07/06/2019    9:01 AM 07/23/2016    3:29 PM 08/31/2013   12:50 PM 08/28/2013   11:18 AM 06/23/2013    1:15 PM 06/16/2013    1:41 PM  Advanced Directives  Does Patient Have a Medical Advance Directive? Yes Yes No Patient has advance directive, copy not in chart Patient has advance directive, copy not in chart Patient has advance directive, copy not in chart Patient has advance directive, copy not in chart  Type of Advance Directive [place] will [place]Living will  [place]Living will [place] SpringsLiving will HAgua DulceLiving will HAlexander CityLiving will  Does patient want to make changes to medical advance directive?    No change requested No change requested    Copy of [place] Chart? No - copy requested No - copy requested  Copy requested from family Copy requested from family Copy requested from family Copy requested from family  Would patient like information on creating a medical advance directive?   No - Patient declined      Pre-existing out of facility DNR  order (yellow form or pink MOST form)    No No No     Current Medications (verified) Outpatient Encounter Medications as of 04/25/2022  Medication Sig   Bacillus Coagulans-Inulin (PROBIOTIC) 1-250 BILLION-MG CAPS Take by mouth daily.   calcium-vitamin D (OSCAL WITH D) 500-200 MG-UNIT tablet Take 1 tablet by mouth daily.   Cholecalciferol (DIALYVITE VITAMIN D 5000) 125 MCG (5000 UT) capsule Take 1 capsule (5,000 Units total) by mouth daily.   cyanocobalamin (,VITAMIN B-12,) 1000 MCG/ML injection Inject 1 mL (1,000 mcg total) into the muscle every 21 ( twenty-one) days.   donepezil (ARICEPT) 10 MG tablet Take 1 tablet (10 mg total) by mouth at bedtime.   famotidine (PEPCID) 20 MG tablet Take 1 tablet (20 mg total) by mouth 2 (two) times daily.   hydrocortisone 2.5 % cream Apply topically 2 (two) times daily as needed (Rash). Qd to bid aa eczema on hands, arms, legs until clear, then prn flares   loratadine (CLARITIN) 10 MG tablet Take 10 mg by mouth daily.   memantine (NAMENDA) 10 MG tablet Take 1 tablet (10 mg total) by mouth 2 (two) times daily.   No facility-administered encounter medications on file as of 04/25/2022.    Allergies (verified) Clarithromycin, Erythromycin ethylsuccinate, Fluticasone-salmeterol, Procaine hcl, Sulfonamide derivatives, and Topiramate   History: Past Medical History:  Diagnosis Date   Arthritis    Breast mass [id number]  benign   Complication of anesthesia    slow to wake up x 1  Concussion 2014, 05/2016   Dry cough    at night   Hemorrhoids    Migraine    evaluated with Dr. Domingo Cocking 11/2009   Osteoporosis    Past Surgical History:  Procedure Laterality Date   ABDOMINAL EXPLORATION SURGERY     for twisted bowel   ABDOMINAL HYSTERECTOMY     yrs ago   [place]   aspirated lump left breast   CARPAL TUNNEL RELEASE     right 03/2001, left 07/2001   ESOPHAGOGASTRODUODENOSCOPY     KNEE ARTHROSCOPY     rt knee   nissenfundiplication      PARTIAL HYSTERECTOMY  1989   dyspasia, ovaries left in   PARTIAL KNEE ARTHROPLASTY Right 06/23/2013   Procedure: RIGHT KNEE MEDIAL UNICOMPARTMENTAL ARTHROPLASTY;  Surgeon: Mauri Pole, MD;  Location: [facility];  Service: Orthopedics;  Laterality: Right;   PARTIAL KNEE ARTHROPLASTY Left 08/31/2013   Procedure: LEFT UNICOMPARTMENTAL KNEE ATHROPLASTY   (MEDIAL) ;  Surgeon: Mauri Pole, MD;  Location: [facility];  Service: Orthopedics;  Laterality: Left;   ROTATOR CUFF REPAIR     right 03/2001, left 07/2001   Family History  Problem Relation Age of Onset   Arthritis Mother    Cancer Mother        lung cancer, dx'd in her 56s   Cancer Father        throat   COPD Brother    Cancer Sister        breast   Breast cancer Sister    Breast cancer Sister    Colon cancer Neg Hx    Social History   Socioeconomic History   Marital status: Married    Spouse name: Not on file   Number of children: 3   Years of education: Not on file   Highest education level: Not on file  Occupational History   Occupation: home  Tobacco Use   Smoking status: Former    Packs/day: 1.00    Years: 5.00    Total pack years: 5.00    Types: Cigarettes    Quit date: 06/17/1978    Years since quitting: 43.8   Smokeless tobacco: Never   Tobacco comments:    5 pack year history  Substance and Sexual Activity   Alcohol use: No   Drug use: No   Sexual activity: Not on file  Other Topics Concern   Not on file  Social History Narrative   Second marriage, 83. Has 3 adult children, adopted 4, 6 grandkids.   From [place], [place]   Education- GED, some college   Social Determinants of Health   Financial Resource Strain: Low Risk  (04/25/2022)   Overall Financial Resource Strain (CARDIA)    Difficulty of Paying Living Expenses: Not hard at all  Food Insecurity: No Food Insecurity (04/25/2022)   Hunger Vital Sign    Worried About Running Out of Food in the Last Year: Never true    Ran Out of Food in the Last Year:  Never true  Transportation Needs: No Transportation Needs (04/25/2022)   PRAPARE - Hydrologist (Medical): No    Lack of Transportation (Non-Medical): No  Physical Activity: Inactive (04/25/2022)   Exercise Vital Sign    Days of Exercise per Week: 0 days    Minutes of Exercise per Session: 0 min  Stress: No Stress Concern Present (07/06/2019)   [place]  Feeling of Stress : Not at all  Social Connections: Moderately Integrated (04/25/2022)   Social Connection and Isolation Panel [NHANES]    Frequency of Communication with Friends and Family: Three times a week    Frequency of Social Gatherings with Friends and Family: More than three times a week    Attends Religious Services: More than 4 times per year    Active Member of [employer] or Organizations: No    Attends Music therapist: Never    Marital Status: Married    Tobacco Counseling Counseling given: Not Answered Tobacco comments: 5 pack year history   Clinical Intake:  Pre-visit preparation completed: Yes  Pain : No/denies pain     Nutritional Risks: None Diabetes: No  How often do you need to have someone help you when you read instructions, pamphlets, or other written materials from your doctor or pharmacy?: 4 - Often (family assists)  Diabetic? no  Interpreter Needed?: No  Information entered by :: C.Tallula Grindle LPN   Activities of Daily Living    04/25/2022   11:27 AM  In your present state of health, do you have any difficulty performing the following activities:  Hearing? 0  Vision? 0  Difficulty concentrating or making decisions? 1  Comment dementia per daughter  Walking or climbing stairs? 0  Dressing or bathing? 1  Comment family assists  Doing errands, shopping? 1  Comment family assists  Preparing Food and eating ? Y  Comment family assists  Using the Toilet? Y  Comment incontinent per daughter   In the past six months, have you accidently leaked urine? Y  Comment wears depends  Do you have problems with loss of bowel control? Y  Comment wears depends  Managing your Medications? Y  Comment family assists  Managing your Finances? Y  Comment family assists  Housekeeping or managing your Housekeeping? Y  Comment family assists    Patient Care Team: Tonia Ghent, MD as PCP - General  Indicate any recent Medical Services you may have received from other than [facility] providers in the past year (date may be approximate).     Assessment:   This is a routine wellness examination for Alice Smith.  Hearing/Vision screen Hearing Screening - Comments:: No aids Vision Screening - Comments:: Glasses- unknown provider  Dietary issues and exercise activities discussed: Current Exercise Habits: The patient does not participate in regular exercise at present, Exercise limited by: Other - see comments (dementia per daughter)   Goals Addressed   None    Depression Screen    04/25/2022   11:23 AM 03/29/2022    3:14 PM 03/24/2021   12:06 PM 07/06/2019    9:01 AM 07/25/2018    3:58 PM 01/25/2017   12:43 PM 08/31/2014    2:39 PM  PHQ 2/9 Scores  PHQ - 2 Score  3  0 0 0 0  PHQ- 9 Score  12  0 0    Exception Documentation Patient refusal  Medical reason        Fall Risk    04/25/2022   11:27 AM 03/29/2022    3:14 PM 03/24/2021   12:06 PM 07/06/2019    9:01 AM 08/06/2017   10:49 AM  Fall Risk   Falls in the past year? 0 1 0 0 No  Number falls in past yr: 0 0 0 0   Injury with Fall? 0 0 0 0   Risk for fall due to : No Fall Risks No Fall Risks No  Fall Risks Medication side effect   Follow up Falls prevention discussed;Falls evaluation completed Falls evaluation completed Falls evaluation completed Falls evaluation completed;Falls prevention discussed     FALL RISK PREVENTION PERTAINING TO THE HOME:  Any stairs in or around the home? Yes  If so, are there any without handrails? No  Home free  of loose throw rugs in walkways, pet beds, electrical cords, etc? Yes  Adequate lighting in your home to reduce risk of falls? Yes   ASSISTIVE DEVICES UTILIZED TO PREVENT FALLS:  Life alert? No  Use of a cane, walker or w/c? No  Grab bars in the bathroom? Yes  Shower chair or bench in shower? Yes  Elevated toilet seat or a handicapped toilet? Yes    Cognitive Function:    05/17/2020    2:33 PM 11/18/2019    1:56 PM 07/06/2019    9:04 AM 02/23/2019   10:02 AM 08/06/2017   10:52 AM  MMSE - Mini Mental State Exam  Not completed: Unable to complete  Refused    Orientation to time  '1  4 4  '$ Orientation to Place  '2  3 3  '$ Registration  '3  3 3  '$ Attention/ Calculation  0  0 0  Recall  '2  3 1  '$ Language- name 2 objects  '2  2 2  '$ Language- repeat  '1  1 1  '$ Language- follow 3 step command  '3  3 3  '$ Language- read & follow direction  0  0 1  Write a sentence  0  1 0  Write a sentence-comments     No subject  Copy design  0  1 1  Copy design-comments    named 3 animals   Total score  '14  21 19        '$ Immunizations Immunization History  Administered Date(s) Administered   Fluad Quad(high Dose 65+) 12/01/2018, 12/03/2019, 03/24/2021, 03/29/2022   Influenza Whole 12/18/2007, 11/19/2009   Influenza, High Dose Seasonal PF 10/09/2016, 12/09/2017   Influenza-Unspecified 10/20/2012, 12/20/2013, 11/26/2014   PFIZER(Purple Top)SARS-COV-2 Vaccination 03/10/2019, 03/31/2019, 01/06/2020, 09/12/2020   Pneumococcal Conjugate-13 08/31/2014   Pneumococcal Polysaccharide-23 11/27/2004, 10/12/2011   Td 02/26/2003   Tdap 09/09/2014   Zoster Recombinat (Shingrix) 06/06/2020, 08/06/2020   Zoster, Live 01/29/2007    TDAP status: Up to date  Flu Vaccine status: Up to date  Pneumococcal vaccine status: Up to date  Covid-19 vaccine status: Information provided on how to obtain vaccines.   Qualifies for Shingles Vaccine? No   Zostavax completed Yes   Shingrix Completed?: Yes  Screening Tests Health  Maintenance  Topic Date Due   COVID-19 Vaccine (5 - 2023-24 season) 10/20/2021   Medicare Annual Wellness (AWV)  04/25/2023   DTaP/Tdap/Td (3 - Td or Tdap) 09/08/2024   Pneumonia Vaccine 74+ Years old  Completed   INFLUENZA VACCINE  Completed   DEXA SCAN  Completed   Hepatitis C Screening  Completed   Zoster Vaccines- Shingrix  Completed   HPV VACCINES  Aged Out   COLONOSCOPY (Pts 45-25yr Insurance coverage will need to be confirmed)  Discontinued    Health Maintenance  Health Maintenance Due  Topic Date Due   COVID-19 Vaccine (5 - 2023-24 season) 10/20/2021    Colorectal cancer screening: No longer required.   Mammogram status: No longer required due to age.  Bone Density status: Completed 08/202/20. Results reflect: Bone density results: OSTEOPOROSIS. Repeat every 2 years. Declined.  Lung Cancer Screening: (Low Dose CT Chest recommended if  Age 31-80 years, 30 pack-year currently smoking OR have quit w/in 15years.) does not qualify.   Lung Cancer Screening Referral: no  Additional Screening:  Hepatitis C Screening: does not qualify; Completed 02/02/2015  Vision Screening: Recommended annual ophthalmology exams for early detection of glaucoma and other disorders of the eye. Is the patient up to date with their annual eye exam?  Yes  Who is the provider or what is the name of the office in which the patient attends annual eye exams? unsure If pt is not established with a provider, would they like to be referred to a provider to establish care? No .   Dental Screening: Recommended annual dental exams for proper oral hygiene  Community Resource Referral / Chronic Care Management: CRR required this visit?  No   CCM required this visit?  No      Plan:     I have personally reviewed and noted the following in the patient's chart:   Medical and social history Use of alcohol, tobacco or illicit drugs  Current medications and supplements including opioid prescriptions.  Patient is not currently taking opioid prescriptions. Functional ability and status Nutritional status Physical activity Advanced directives List of other physicians Hospitalizations, surgeries, and ER visits in previous 12 months Vitals Screenings to include cognitive, depression, and falls Referrals and appointments  In addition, I have reviewed and discussed with patient certain preventive protocols, quality metrics, and best practice recommendations. A written personalized care plan for preventive services as well as general preventive health recommendations were provided to patient.     Lebron Conners, LPN   [id number]   Nurse Notes: Pt unable to answer questions, daughter assisted with visit. Unable to perform 6 SIT.

## 2022-04-25 NOTE — Patient Instructions (Signed)
Alice Smith , Thank you for taking time to come for your Medicare Wellness Visit. I appreciate your ongoing commitment to your health goals. Please review the following plan we discussed and let me know if I can assist you in the future.   These are the goals we discussed:  Goals      care coordination activities - no follow up needed     Interventions Today    Flowsheet Row Most Recent Value  Chronic Disease   Chronic disease during today's visit Other  [Dementia]  General Interventions   General Interventions Discussed/Reviewed General Interventions Reviewed  [Care coordination program/ services discussed.  SDOH survey completed.  Caregiver advised to contact PCP office if care coordinations services needed in the future.]           Patient Stated     07/06/2019, I will continue to walk 3 days a week for 45 minutes.     Patient Stated              This is a list of the screening recommended for you and due dates:  Health Maintenance  Topic Date Due   COVID-19 Vaccine (5 - 2023-24 season) 10/20/2021   Medicare Annual Wellness Visit  04/25/2023   DTaP/Tdap/Td vaccine (3 - Td or Tdap) 09/08/2024   Pneumonia Vaccine  Completed   Flu Shot  Completed   DEXA scan (bone density measurement)  Completed   Hepatitis C Screening: USPSTF Recommendation to screen - Ages 53-79 yo.  Completed   Zoster (Shingles) Vaccine  Completed   HPV Vaccine  Aged Out   Colon Cancer Screening  Discontinued    Advanced directives: Please bring a copy of your health care power of attorney and living will to the office to be added to your chart at your convenience.   Conditions/risks identified: Aim for 30 minutes of exercise or brisk walking, 6-8 glasses of water, and 5 servings of fruits and vegetables each day.   Next appointment: Follow up in one year for your annual wellness visit 04/30/23 @ 2:30 pm via telephone.   Preventive Care 80 Years and Older, Female Preventive care refers to  lifestyle choices and visits with your health care provider that can promote health and wellness. What does preventive care include? A yearly physical exam. This is also called an annual well check. Dental exams once or twice a year. Routine eye exams. Ask your health care provider how often you should have your eyes checked. Personal lifestyle choices, including: Daily care of your teeth and gums. Regular physical activity. Eating a healthy diet. Avoiding tobacco and drug use. Limiting alcohol use. Practicing safe sex. Taking low-dose aspirin every day. Taking vitamin and mineral supplements as recommended by your health care provider. What happens during an annual well check? The services and screenings done by your health care provider during your annual well check will depend on your age, overall health, lifestyle risk factors, and family history of disease. Counseling  Your health care provider may ask you questions about your: Alcohol use. Tobacco use. Drug use. Emotional well-being. Home and relationship well-being. Sexual activity. Eating habits. History of falls. Memory and ability to understand (cognition). Work and work Statistician. Reproductive health. Screening  You may have the following tests or measurements: Height, weight, and BMI. Blood pressure. Lipid and cholesterol levels. These may be checked every 5 years, or more frequently if you are over 26 years old. Skin check. Lung cancer screening. You may have this screening  every year starting at age 91 if you have a 30-pack-year history of smoking and currently smoke or have quit within the past 15 years. Fecal occult blood test (FOBT) of the stool. You may have this test every year starting at age 55. Flexible sigmoidoscopy or colonoscopy. You may have a sigmoidoscopy every 5 years or a colonoscopy every 10 years starting at age 73. Hepatitis C blood test. Hepatitis B blood test. Sexually transmitted disease  (STD) testing. Diabetes screening. This is done by checking your blood sugar (glucose) after you have not eaten for a while (fasting). You may have this done every 1-3 years. Bone density scan. This is done to screen for osteoporosis. You may have this done starting at age 61. Mammogram. This may be done every 1-2 years. Talk to your health care provider about how often you should have regular mammograms. Talk with your health care provider about your test results, treatment options, and if necessary, the need for more tests. Vaccines  Your health care provider may recommend certain vaccines, such as: Influenza vaccine. This is recommended every year. Tetanus, diphtheria, and acellular pertussis (Tdap, Td) vaccine. You may need a Td booster every 10 years. Zoster vaccine. You may need this after age 39. Pneumococcal 13-valent conjugate (PCV13) vaccine. One dose is recommended after age 35. Pneumococcal polysaccharide (PPSV23) vaccine. One dose is recommended after age 8. Talk to your health care provider about which screenings and vaccines you need and how often you need them. This information is not intended to replace advice given to you by your health care provider. Make sure you discuss any questions you have with your health care provider. Document Released: 03/04/2015 Document Revised: 10/26/2015 Document Reviewed: 12/07/2014 Elsevier Interactive Patient Education  2017 [place] Prevention in the Home Falls can cause injuries. They can happen to people of all ages. There are many things you can do to make your home safe and to help prevent falls. What can I do on the outside of my home? Regularly fix the edges of walkways and driveways and fix any cracks. Remove anything that might make you trip as you walk through a door, such as a raised step or threshold. Trim any bushes or trees on the path to your home. Use bright outdoor lighting. Clear any walking paths of anything  that might make someone trip, such as rocks or tools. Regularly check to see if handrails are loose or broken. Make sure that both sides of any steps have handrails. Any raised decks and porches should have guardrails on the edges. Have any leaves, snow, or ice cleared regularly. Use sand or salt on walking paths during winter. Clean up any spills in your garage right away. This includes oil or grease spills. What can I do in the bathroom? Use night lights. Install grab bars by the toilet and in the tub and shower. Do not use towel bars as grab bars. Use non-skid mats or decals in the tub or shower. If you need to sit down in the shower, use a plastic, non-slip stool. Keep the floor dry. Clean up any water that spills on the floor as soon as it happens. Remove soap buildup in the tub or shower regularly. Attach bath mats securely with double-sided non-slip rug tape. Do not have throw rugs and other things on the floor that can make you trip. What can I do in the bedroom? Use night lights. Make sure that you have a light by your bed  that is easy to reach. Do not use any sheets or blankets that are too big for your bed. They should not hang down onto the floor. Have a firm chair that has side arms. You can use this for support while you get dressed. Do not have throw rugs and other things on the floor that can make you trip. What can I do in the kitchen? Clean up any spills right away. Avoid walking on wet floors. Keep items that you use a lot in easy-to-reach places. If you need to reach something above you, use a strong step stool that has a grab bar. Keep electrical cords out of the way. Do not use floor polish or wax that makes floors slippery. If you must use wax, use non-skid floor wax. Do not have throw rugs and other things on the floor that can make you trip. What can I do with my stairs? Do not leave any items on the stairs. Make sure that there are handrails on both sides of  the stairs and use them. Fix handrails that are broken or loose. Make sure that handrails are as long as the stairways. Check any carpeting to make sure that it is firmly attached to the stairs. Fix any carpet that is loose or worn. Avoid having throw rugs at the top or bottom of the stairs. If you do have throw rugs, attach them to the floor with carpet tape. Make sure that you have a light switch at the top of the stairs and the bottom of the stairs. If you do not have them, ask someone to add them for you. What else can I do to help prevent falls? Wear shoes that: Do not have high heels. Have rubber bottoms. Are comfortable and fit you well. Are closed at the toe. Do not wear sandals. If you use a stepladder: Make sure that it is fully opened. Do not climb a closed stepladder. Make sure that both sides of the stepladder are locked into place. Ask someone to hold it for you, if possible. Clearly mark and make sure that you can see: Any grab bars or handrails. First and last steps. Where the edge of each step is. Use tools that help you move around (mobility aids) if they are needed. These include: Canes. Walkers. Scooters. Crutches. Turn on the lights when you go into a dark area. Replace any light bulbs as soon as they burn out. Set up your furniture so you have a clear path. Avoid moving your furniture around. If any of your floors are uneven, fix them. If there are any pets around you, be aware of where they are. Review your medicines with your doctor. Some medicines can make you feel dizzy. This can increase your chance of falling. Ask your doctor what other things that you can do to help prevent falls. This information is not intended to replace advice given to you by your health care provider. Make sure you discuss any questions you have with your health care provider. Document Released: 12/02/2008 Document Revised: 07/14/2015 Document Reviewed: 03/12/2014 Elsevier Interactive  Patient Education  2017 [employer].

## 2022-05-15 ENCOUNTER — Ambulatory Visit (INDEPENDENT_AMBULATORY_CARE_PROVIDER_SITE_OTHER): Payer: Medicare Other

## 2022-05-15 DIAGNOSIS — E538 Deficiency of other specified B group vitamins: Secondary | ICD-10-CM

## 2022-05-15 MED ORDER — CYANOCOBALAMIN 1000 MCG/ML IJ SOLN
1000.0000 ug | Freq: Once | INTRAMUSCULAR | Status: AC
  Administered 2022-05-15: 1000 ug via INTRAMUSCULAR

## 2022-05-15 NOTE — Progress Notes (Signed)
Per orders of Dr. Graham Duncan, injection of B-12 given by Eve Rey in left deltoid. Patient tolerated injection well.    

## 2022-05-27 ENCOUNTER — Encounter: Payer: Self-pay | Admitting: Family Medicine

## 2022-05-29 ENCOUNTER — Encounter: Payer: Self-pay | Admitting: Family Medicine

## 2022-05-29 NOTE — Telephone Encounter (Signed)
Letter emailed to the requested email.

## 2022-06-05 ENCOUNTER — Ambulatory Visit (INDEPENDENT_AMBULATORY_CARE_PROVIDER_SITE_OTHER): Payer: Medicare Other

## 2022-06-05 DIAGNOSIS — E538 Deficiency of other specified B group vitamins: Secondary | ICD-10-CM | POA: Diagnosis not present

## 2022-06-05 MED ORDER — CYANOCOBALAMIN 1000 MCG/ML IJ SOLN
1000.0000 ug | Freq: Once | INTRAMUSCULAR | Status: AC
  Administered 2022-06-05: 1000 ug via INTRAMUSCULAR

## 2022-06-05 NOTE — Progress Notes (Signed)
Per orders of Dr. Para March, every 21 days injection of B12 1000 mcg/ml given by Eual Fines, CMA in Right Deltoid. Patient tolerated injection well.  Husband will set up next appointment if not already done.

## 2022-06-26 ENCOUNTER — Ambulatory Visit (INDEPENDENT_AMBULATORY_CARE_PROVIDER_SITE_OTHER): Payer: Medicare Other

## 2022-06-26 DIAGNOSIS — E538 Deficiency of other specified B group vitamins: Secondary | ICD-10-CM

## 2022-06-26 MED ORDER — CYANOCOBALAMIN 1000 MCG/ML IJ SOLN
1000.0000 ug | Freq: Once | INTRAMUSCULAR | Status: AC
  Administered 2022-06-26: 1000 ug via INTRAMUSCULAR

## 2022-06-26 NOTE — Progress Notes (Signed)
Per orders of Dr. Para March, every 21 days injection of B12 1000 mcg/ml given by Eual Fines, CMA in Left Deltoid. Patient tolerated injection well.

## 2022-07-04 NOTE — Patient Instructions (Signed)
Below is our plan:  We will continue donepezil 10mg  at bedtime and memantine 10mg  twice daily.   Please make sure you are staying well hydrated. I recommend 50-60 ounces daily. Well balanced diet and regular exercise encouraged. Consistent sleep schedule with 6-8 hours recommended.   Please continue follow up with care team as directed.   Follow up with me in 6 months   You may receive a survey regarding today's visit. I encourage you to leave honest feed back as I do use this information to improve patient care. Thank you for seeing me today!   Management of Memory Problems   There are some general things you can do to help manage your memory problems.  Your memory may not in fact recover, but by using techniques and strategies you will be able to manage your memory difficulties better.   1)  Establish a routine. Try to establish and then stick to a regular routine.  By doing this, you will get used to what to expect and you will reduce the need to rely on your memory.  Also, try to do things at the same time of day, such as taking your medication or checking your calendar first thing in the morning. Think about think that you can do as a part of a regular routine and make a list.  Then enter them into a daily planner to remind you.  This will help you establish a routine.   2)  Organize your environment. Organize your environment so that it is uncluttered.  Decrease visual stimulation.  Place everyday items such as keys or cell phone in the same place every day (ie.  Basket next to front door) Use post it notes with a brief message to yourself (ie. Turn off light, lock the door) Use labels to indicate where things go (ie. Which cupboards are for food, dishes, etc.) Keep a notepad and pen by the telephone to take messages   3)  Memory Aids A diary or journal/notebook/daily planner Making a list (shopping list, chore list, to do list that needs to be done) Using an alarm as a reminder  (kitchen timer or cell phone alarm) Using cell phone to store information (Notes, Calendar, Reminders) Calendar/White board placed in a prominent position Post-it notes   In order for memory aids to be useful, you need to have good habits.  It's no good remembering to make a note in your journal if you don't remember to look in it.  Try setting aside a certain time of day to look in journal.   4)  Improving mood and managing fatigue. There may be other factors that contribute to memory difficulties.  Factors, such as anxiety, depression and tiredness can affect memory. Regular gentle exercise can help improve your mood and give you more energy. Exercise: there are short videos created by the [employer] on Health specially for older adults: https://bit.ly/2I30q97.  Mediterranean diet: which emphasizes fruits, vegetables, whole grains, legumes, fish, and other seafood; unsaturated fats such as olive oils; and low amounts of red meat, eggs, and sweets. A variation of this, called MIND (Mediterranean-DASH Intervention for Neurodegenerative Delay) incorporates the DASH (Dietary Approaches to Stop Hypertension) diet, which has been shown to lower high blood pressure, a risk factor for Alzheimer's disease. More information at: ExitMarketing.de.  Aerobic exercise that improve heart health is also good for the mind.  [employer] on Aging have short videos for exercises that you can do at home: BlindWorkshop.com.pt Simple relaxation  techniques may help relieve symptoms of anxiety Try to get back to completing activities or hobbies you enjoyed doing in the past. Learn to pace yourself through activities to decrease fatigue. Find out about some local support groups where you can share experiences with others. Try and achieve 7-8 hours of sleep at night.   Resources for Family/Caregiver  Online caregiver support  groups can be found at LDLive.be or call Alzheimer's Association's 24/7 hotline: (248) 067-6717. [facility] offers in-person, virtual support groups and individual counseling for both care partners and persons with memory loss. Call for more information at [phone].   Advanced care plan: there are two types of Power of Attorney: healthcare and durable. Healthcare POA is a designated person to make healthcare decisions on your behalf if you were too sick to make them yourself. This person can be selected and documented by your physician. Durable POA has to be set up with a lawyer who takes charge of your finances and estate if you were too sick or cognitively impaired to manage your finances accurately. You can find a local Elder Engineer, mining here: ToyShower.it.  Check out www.planyourlifespan.org, which will help you plan before a crisis and decide who will take care of life considerations in a circumstance where you may not be able to speak for yourself.   Helpful books (available on [employer] or your local bookstore):  By Dr. Army Chaco: Keeping Love Alive as Memories Fade: The 5 Love Languages and the Alzheimer's Journey Nov 20, 2014 The Dementia Care Partner's Workbook: A Guide for Understanding, Education, and [employer] - July 20, 2017.  Both available for less than $15.   "Coping with behavior change in dementia: a family caregiver's guide" by [place] "A Caregiver's Guide to Dementia: Using Activities and Other Strategies to Prevent, Reduce and Manage Behavioral Symptoms" by Osie Bond Gitlin and [employer].  Arts development officer of Joy for the Person with Alzheimer's or Dementia" 4th edition by Vance Peper  Caregiver videos on common behaviors related to dementia: quierodirigir.com  [facility] Caregiver Portal: free to sign up, links to local resources: [email or web address]-[email or web address]/login

## 2022-07-04 NOTE — Progress Notes (Unsigned)
PATIENT: Alice Smith DOB: March 26, 1945  REASON FOR VISIT: follow up HISTORY FROM: patient  No chief complaint on file.    HISTORY OF PRESENT ILLNESS:  07/04/22 ALL:  Alice Smith returns for follow up for dementia. She was last seen 12/2021 and doing fialry well. She continues donepezil 10mg  QHS and memantine 10mg  BID.   12/28/2021 ALL: Alice Smith returns for follow up for dementia. She continues donepezil 10mg  QHS and memantine 10mg  BID. She presents with her daughter, Shirlee More, who aids in history. No significant changes since last visit. She seems to tolerate meds. She is needing more assistance with ADLs. She seems to eat fairly well but does push the plate away. Husband does offer the food again and eventually she will eat. She is sleeping well. She goes to bed around 10 and wakes at 7. She sleeps through the night. She needs assistance with showers and dressing. She is unable to control bowel or bladder function. She is wearing a depend. She is not as active. She will freeze for a few seconds from time to time. Concerns for pain were raised a few times but she will say "ouch" at inappropriate times. History of arthritis. Multiple orthopedic surgeries. She is not taking as much. She does continue to go to church when she can.   05/17/2021 ALL: Alice Smith returns for follow up for dementia. She continues Namenda and Aricept. Memory seems to have declined some. She is having more difficulty with conversations. She does not initiate conversation but usually able to participate if someone speaks to her first. She continues to need assistance with most ADLs. Able to get dressed. Husband helps with bathing but she is able to wash her hair. Appetite is good. They have some concerns of her binge eating if not supervised. She is sleeping really well.   She continues close follow up with PCP. She was seen yesterday for chronic cough, bowel incontinence, urinary frequency and recent fall. She was prescribed  Hycodan and advised to continue Mucinex. Labs were unremarkable but unable to obtain UA. She wears depends.   11/16/2020 ALL:  Alice Smith returns for follow up for dementia. She continues Namenda and Aricept. Memory seems stable. No significant changes. She is sleeping well. Remains mostly independent with ADLs but does need help preparing meals and medications.  She had Covid about a month ago. She had mild symptoms. She took antiviral and recovered well. She continues to walk three times a day. She is followed closely by PCP.   05/17/2020 ALL:  Alice Smith returns for follow up for dementia. She continues Aricept and Namenda. She presents with Shirlee More, her daughter, who aids in history. Memory waxes and wanes. Seems happy but mostly confused. She does not complain of any pain unless an area (knees, toes) are manipulated. She needs to be reminded to try to use the restroom. Occasionally has accidents. She will not use public restroom. No UTIs. Family reminds her to drink fluids. Miralax is helping with constipation. She showers independently. Some assistance with getting dressed. She will eat if food is provided but she rarely initiates meals. She will pick up a snack from the counter. She continues to walk 1-2 miles daily. No difficulty with gait. No falls. Has 7 children. Husband and children provide 24/7 supervision. Youngest son (of 7 children) still lives at home.   11/18/2019 ALL:  Alice Smith is a 77 y.o. female here today for follow up for dementia. She continues Aricept and Namenda. She is tolerating medications  well. Mr Laneve is helping to dose medications. She continues ADL's independently. She is eating and drinking normally. She has had several episode of bowel incontinence. She is being treated for constipation. This has helped. She is not comfortable using public restrooms. Memory waxes and wanes. She continues to have lack of interest. She has a flat affect. No changes in movement. She is  walking 2 miles a day. She continues to go to church.   HISTORY: (copied from my note on 02/23/2019)  Alice Smith is a 77 y.o. female here today for follow up for dementia. She continues Aricept 10mg  daily and Namenda 10mg  BID. She is tolerating medications well. Her husband is with her today and aids in history. He reports that memory is fairly stable. They have noticed some worsening with concentration. She is wanting to sit still more than usual. She continues to walk about 1 mile several times a week. She has had more urine and bowel incontinence. She has had two accidents over the past 6 months. She is wearing pads. She is eating well. She does seem to enjoy simple sugars. She is able to perform ADL's. She does not cook. She does not drive. No falls.    HISTORY: (copied from my note on 08/11/2018)   Alice Smith is a 77 y.o. female here today for follow up of dementia.  Travonna is accompanied by her husband, Mr. Sise, who aids in obtaining history.  He reports that overall she has been fairly stable since her last visit in June 2019.  Unfortunately there seems to be some confusion on what medication she was taking. At her last visit, she was instructed to continue Namenda 10 mg twice daily.  Dr. Marjory Lies added Aricept 10 mg daily (titrating from 5mg  daily x 1 week).  They are unsure how long she continued Namenda.  It appears that it was discontinued from her medication list.  Mr. Beals does not feel that she has taken this in some time.  She did tolerate the medication well.  She continues to perform all ADLs at home.  She is able to dress bathe and feed herself.  He reports that she can remember events from long ago but struggles with short-term information.  She does very rarely drive when accompanied by a family member.  Her husband reports that this is only short distances and he either rides with her or follows her.  He denies any falls or injuries.  No behavioral concerns at this  time.   HISTORY (copied from Dr Richrd Humbles note on 08/06/2017)    UPDATE (08/06/17, VRP): Since last visit, doing slightly worse. Tolerating memantine. No alleviating or aggravating factors. Driving less now. Some diff with shopping. MMSE 19/30. Myrtis Ser 6/6. [place] IADL 5/8.    PRIOR HPI (02/04/17): 77 year old left-handed female here for evaluation of cognitive decline, confusion, lack of emotions, difficulty processing information.   Family has noted at least 2 years of gradual onset progressive confusion, fatigue, decreased processing speed, comprehension difficulty, decreased interest in day-to-day activity specifically household chores and cleanliness, which she used to be quite involved with.  She is also shown flat and emotional affect, less nurturing, less social.  She has had trouble with maintaining her checkbook, finances, shopping and ordering.  In June 2018 she got lost in [place] when trying to pick up their grandson.   Overall patient having decreased speech output and fluency.   Patient also underwent 7 or 8 surgeries in 2014, and family noted  that many symptoms started at that time   REVIEW OF SYSTEMS: Out of a complete 14 system review of symptoms, the patient complains only of the following symptoms, memory loss, incontinence, cough, recent fall and all other reviewed systems are negative.   ALLERGIES: Allergies  Allergen Reactions   Clarithromycin     REACTION: nausea and vomiting   Erythromycin Ethylsuccinate     REACTION: GI upset   Fluticasone-Salmeterol     REACTION: Sore tongue and rash   Procaine Hcl     REACTION: swelling   Sulfonamide Derivatives     REACTION: rash   Topiramate     REACTION: confusion    HOME MEDICATIONS: Outpatient Medications Prior to Visit  Medication Sig Dispense Refill   Bacillus Coagulans-Inulin (PROBIOTIC) 1-250 BILLION-MG CAPS Take by mouth daily.     calcium-vitamin D (OSCAL WITH D) 500-200 MG-UNIT tablet Take 1 tablet by  mouth daily.     Cholecalciferol (DIALYVITE VITAMIN D 5000) 125 MCG (5000 UT) capsule Take 1 capsule (5,000 Units total) by mouth daily.     cyanocobalamin (,VITAMIN B-12,) 1000 MCG/ML injection Inject 1 mL (1,000 mcg total) into the muscle every 21 ( twenty-one) days. 1 mL    donepezil (ARICEPT) 10 MG tablet Take 1 tablet (10 mg total) by mouth at bedtime. 90 tablet 0   famotidine (PEPCID) 20 MG tablet Take 1 tablet (20 mg total) by mouth 2 (two) times daily. 180 tablet 1   hydrocortisone 2.5 % cream Apply topically 2 (two) times daily as needed (Rash). Qd to bid aa eczema on hands, arms, legs until clear, then prn flares 60 g 2   loratadine (CLARITIN) 10 MG tablet Take 10 mg by mouth daily.     memantine (NAMENDA) 10 MG tablet Take 1 tablet (10 mg total) by mouth 2 (two) times daily. 180 tablet 3   No facility-administered medications prior to visit.    PAST MEDICAL HISTORY: Past Medical History:  Diagnosis Date   Arthritis    Breast mass 05/2001   benign   Complication of anesthesia    slow to wake up x 1   Concussion 2014, 05/2016   Dry cough    at night   Hemorrhoids    Migraine    evaluated with Dr. Neale Burly 11/2009   Osteoporosis     PAST SURGICAL HISTORY: Past Surgical History:  Procedure Laterality Date   ABDOMINAL EXPLORATION SURGERY     for twisted bowel   ABDOMINAL HYSTERECTOMY     yrs ago   BREAST SURGERY  1989   aspirated lump left breast   CARPAL TUNNEL RELEASE     right 03/2001, left 07/2001   ESOPHAGOGASTRODUODENOSCOPY     KNEE ARTHROSCOPY     rt knee   nissenfundiplication     PARTIAL HYSTERECTOMY  1989   dyspasia, ovaries left in   PARTIAL KNEE ARTHROPLASTY Right 06/23/2013   Procedure: RIGHT KNEE MEDIAL UNICOMPARTMENTAL ARTHROPLASTY;  Surgeon: Shelda Pal, MD;  Location: [facility];  Service: Orthopedics;  Laterality: Right;   PARTIAL KNEE ARTHROPLASTY Left 08/31/2013   Procedure: LEFT UNICOMPARTMENTAL KNEE ATHROPLASTY   (MEDIAL) ;  Surgeon: Shelda Pal,  MD;  Location: [facility];  Service: Orthopedics;  Laterality: Left;   ROTATOR CUFF REPAIR     right 03/2001, left 07/2001    FAMILY HISTORY: Family History  Problem Relation Age of Onset   Arthritis Mother    Cancer Mother        lung cancer,  dx'd in her 9s   Cancer Father        throat   COPD Brother    Cancer Sister        breast   Breast cancer Sister    Breast cancer Sister    Colon cancer Neg Hx     SOCIAL HISTORY: Social History   Socioeconomic History   Marital status: Married    Spouse name: Not on file   Number of children: 3   Years of education: Not on file   Highest education level: Not on file  Occupational History   Occupation: home  Tobacco Use   Smoking status: Former    Packs/day: 1.00    Years: 5.00    Additional pack years: 0.00    Total pack years: 5.00    Types: Cigarettes    Quit date: 06/17/1978    Years since quitting: 44.0   Smokeless tobacco: Never   Tobacco comments:    5 pack year history  Substance and Sexual Activity   Alcohol use: No   Drug use: No   Sexual activity: Not on file  Other Topics Concern   Not on file  Social History Narrative   Second marriage, 17. Has 3 adult children, adopted 4, 6 grandkids.   From [place], [place]   Education- GED, some college   Social Determinants of Health   Financial Resource Strain: Low Risk  (04/25/2022)   Overall Financial Resource Strain (CARDIA)    Difficulty of Paying Living Expenses: Not hard at all  Food Insecurity: No Food Insecurity (04/25/2022)   Hunger Vital Sign    Worried About Running Out of Food in the Last Year: Never true    Ran Out of Food in the Last Year: Never true  Transportation Needs: No Transportation Needs (04/25/2022)   PRAPARE - Administrator, Civil Service (Medical): No    Lack of Transportation (Non-Medical): No  Physical Activity: Inactive (04/25/2022)   Exercise Vital Sign    Days of Exercise per Week: 0 days    Minutes of Exercise per Session: 0  min  Stress: No Stress Concern Present (07/06/2019)   [employer] of Occupational Health - Occupational Stress Questionnaire    Feeling of Stress : Not at all  Social Connections: Moderately Integrated (04/25/2022)   Social Connection and Isolation Panel [NHANES]    Frequency of Communication with Friends and Family: Three times a week    Frequency of Social Gatherings with Friends and Family: More than three times a week    Attends Religious Services: More than 4 times per year    Active Member of [employer] or Organizations: No    Attends Banker Meetings: Never    Marital Status: Married  Catering manager Violence: Not At Risk (04/25/2022)   Humiliation, Afraid, Rape, and Kick questionnaire    Fear of Current or Ex-Partner: No    Emotionally Abused: No    Physically Abused: No    Sexually Abused: No      PHYSICAL EXAM  There were no vitals filed for this visit.     There is no height or weight on file to calculate BMI.  Generalized: Well developed, in no acute distress, flat affect  Cardiology: normal rate and rhythm, no murmur noted Respiratory: diffuse wheezing of upper and lower lobes, bilaterally, with auscultation of posterior lung fields. No acute dyspnea. Patient coughs throughout visit. Nonproductive. O2 99%.  Neurological examination  Mentation: Alert, Unable to complete  MMSE, not oriented to time or place, not able to assist with history taking. Follows all commands speech and language fluent.  Cranial nerve II-XII: Pupils were equal round reactive to light. Extraocular movements were full, visual field were full on confrontational test. Facial sensation and strength were normal. Head turning and shoulder shrug  were normal and symmetric. Motor: The motor testing reveals 5 over 5 strength of all 4 extremities. Good symmetric motor tone is noted throughout. No bradykinesia noted. No cogwheel rigidity.  Unable to test coordination.  Gait and station: Gait is  normal. Normal posture.    DIAGNOSTIC DATA (LABS, IMAGING, TESTING) - I reviewed patient records, labs, notes, testing and imaging myself where available.     05/17/2020    2:33 PM 11/18/2019    1:56 PM 07/06/2019    9:04 AM  MMSE - Mini Mental State Exam  Not completed: Unable to complete  Refused  Orientation to time  1   Orientation to Place  2   Registration  3   Attention/ Calculation  0   Recall  2   Language- name 2 objects  2   Language- repeat  1   Language- follow 3 step command  3   Language- read & follow direction  0   Write a sentence  0   Copy design  0   Total score  14      Lab Results  Component Value Date   WBC 5.7 05/16/2021   HGB 14.4 05/16/2021   HCT 42.8 05/16/2021   MCV 92.3 05/16/2021   PLT 205.0 05/16/2021      Component Value Date/Time   NA 140 05/16/2021 1053   K 3.8 05/16/2021 1053   CL 104 05/16/2021 1053   CO2 27 05/16/2021 1053   GLUCOSE 92 05/16/2021 1053   BUN 13 05/16/2021 1053   CREATININE 0.84 05/16/2021 1053   CREATININE 0.81 07/25/2018 1527   CALCIUM 9.2 05/16/2021 1053   PROT 6.7 05/16/2021 1053   ALBUMIN 4.0 05/16/2021 1053   AST 23 05/16/2021 1053   ALT 19 05/16/2021 1053   ALKPHOS 95 05/16/2021 1053   BILITOT 0.8 05/16/2021 1053   GFRNONAA 90 (L) 09/01/2013 0452   GFRAA >90 09/01/2013 0452   Lab Results  Component Value Date   CHOL 216 (H) 03/24/2021   HDL 50.10 03/24/2021   LDLCALC 132 (H) 03/24/2021   LDLDIRECT 129.8 10/01/2011   TRIG 170.0 (H) 03/24/2021   CHOLHDL 4 03/24/2021   No results found for: "HGBA1C" Lab Results  Component Value Date   VITAMINB12 796 09/27/2021   Lab Results  Component Value Date   TSH 6.74 (H) 03/24/2021       ASSESSMENT AND PLAN 77 y.o. year old female  has a past medical history of Arthritis, Breast mass (05/2001), Complication of anesthesia, Concussion (2014, 05/2016), Dry cough, Hemorrhoids, Migraine, and Osteoporosis. here with   No diagnosis found.   Shelene is  doing fairly well from memory standpoint. Family has noticed some decline in conversation and activity. We will continue Aricept and Namenda as prescribed. I have discussed clinical progression and disease process with her daughter. We will focus on safety and comfort. I have encouraged Shirlee More to try Tylenol in the mornings to see if concerns of pain improve. She was encouraged to continue healthy lifestyle habits. She will continue close follow up with PCP. She will stay active. She will follow up with me in 6-12 months.    No orders of  the defined types were placed in this encounter.     No orders of the defined types were placed in this encounter.   I spent 30 minutes of face-to-face and non-face-to-face time with patient.  This included previsit chart review, lab review, study review, order entry, electronic health record documentation, patient education.    Shawnie Dapper, FNP-C 07/04/2022, 8:25 AM [facility] [address], [address] [place], [place] [address] [phone]

## 2022-07-05 ENCOUNTER — Encounter: Payer: Self-pay | Admitting: Family Medicine

## 2022-07-05 ENCOUNTER — Ambulatory Visit: Payer: Medicare Other | Admitting: Family Medicine

## 2022-07-05 DIAGNOSIS — F03B0 Unspecified dementia, moderate, without behavioral disturbance, psychotic disturbance, mood disturbance, and anxiety: Secondary | ICD-10-CM | POA: Diagnosis not present

## 2022-07-05 MED ORDER — MEMANTINE HCL 10 MG PO TABS: 10.0000 mg | ORAL_TABLET | Freq: Two times a day (BID) | ORAL | 3 refills | Status: DC

## 2022-07-05 MED ORDER — DONEPEZIL HCL 10 MG PO TABS: 10.0000 mg | ORAL_TABLET | Freq: Every day | ORAL | 3 refills | Status: DC

## 2022-07-06 ENCOUNTER — Encounter: Payer: Self-pay | Admitting: Family Medicine

## 2022-07-06 ENCOUNTER — Ambulatory Visit (INDEPENDENT_AMBULATORY_CARE_PROVIDER_SITE_OTHER): Payer: Medicare Other | Admitting: Family Medicine

## 2022-07-06 VITALS — BP 106/70 | HR 67 | Temp 97.9°F | Ht 61.0 in | Wt 187.0 lb

## 2022-07-06 DIAGNOSIS — Z0000 Encounter for general adult medical examination without abnormal findings: Secondary | ICD-10-CM

## 2022-07-06 DIAGNOSIS — F03B0 Unspecified dementia, moderate, without behavioral disturbance, psychotic disturbance, mood disturbance, and anxiety: Secondary | ICD-10-CM

## 2022-07-06 DIAGNOSIS — M810 Age-related osteoporosis without current pathological fracture: Secondary | ICD-10-CM

## 2022-07-06 DIAGNOSIS — E538 Deficiency of other specified B group vitamins: Secondary | ICD-10-CM

## 2022-07-06 DIAGNOSIS — M179 Osteoarthritis of knee, unspecified: Secondary | ICD-10-CM | POA: Diagnosis not present

## 2022-07-06 DIAGNOSIS — Z7189 Other specified counseling: Secondary | ICD-10-CM

## 2022-07-06 LAB — COMPREHENSIVE METABOLIC PANEL
ALT: 16 U/L (ref 0–35)
AST: 20 U/L (ref 0–37)
Albumin: 3.8 g/dL (ref 3.5–5.2)
Alkaline Phosphatase: 79 U/L (ref 39–117)
BUN: 15 mg/dL (ref 6–23)
CO2: 28 mEq/L (ref 19–32)
Calcium: 9.1 mg/dL (ref 8.4–10.5)
Chloride: 104 mEq/L (ref 96–112)
Creatinine, Ser: 0.8 mg/dL (ref 0.40–1.20)
GFR: 71.21 mL/min (ref >60.00)
Glucose, Bld: 96 mg/dL (ref 70–99)
Potassium: 3.9 mEq/L (ref 3.5–5.1)
Sodium: 139 mEq/L (ref 135–145)
Total Bilirubin: 0.7 mg/dL (ref 0.2–1.2)
Total Protein: 6.7 g/dL (ref 6.0–8.3)

## 2022-07-06 LAB — CBC WITH DIFFERENTIAL/PLATELET
Basophils Absolute: 0 10*3/uL (ref 0.0–0.1)
Basophils Relative: 0.9 % (ref 0.0–3.0)
Eosinophils Absolute: 0.2 10*3/uL (ref 0.0–0.7)
Eosinophils Relative: 4.2 % (ref 0.0–5.0)
HCT: 41.4 % (ref 36.0–46.0)
Hemoglobin: 14.1 g/dL (ref 12.0–15.0)
Lymphocytes Relative: 38 % (ref 12.0–46.0)
Lymphs Abs: 2.1 10*3/uL (ref 0.7–4.0)
MCHC: 34.1 g/dL (ref 30.0–36.0)
MCV: 90.9 fl (ref 78.0–100.0)
Monocytes Absolute: 0.4 10*3/uL (ref 0.1–1.0)
Monocytes Relative: 7.5 % (ref 3.0–12.0)
Neutro Abs: 2.7 10*3/uL (ref 1.4–7.7)
Neutrophils Relative %: 49.4 % (ref 43.0–77.0)
Platelets: 222 10*3/uL (ref 150.0–400.0)
RBC: 4.55 Mil/uL (ref 3.87–5.11)
RDW: 13.6 % (ref 11.5–15.5)
WBC: 5.5 10*3/uL (ref 4.0–10.5)

## 2022-07-06 LAB — VITAMIN B12: Vitamin B-12: 845 pg/mL (ref 211–911)

## 2022-07-06 LAB — TSH: TSH: 3.18 u[IU]/mL (ref 0.35–5.50)

## 2022-07-06 LAB — VITAMIN D 25 HYDROXY (VIT D DEFICIENCY, FRACTURES): VITD: 63.24 ng/mL (ref 30.00–100.00)

## 2022-07-06 NOTE — Progress Notes (Unsigned)
No syncope.  Lower BP noted.  Not known to be lightheaded. Cautions d/w pt.  Some clear rhinorrhea yesterday.  Better today.    Defer lipid check at this point.    H/o osteoporosis.  Recheck vit D pending.  It may be reasonable to defer DXA at this point.   Defer colon screening.   Would defer mammogram, d/w pt and daughter.   Advance directive- husband and daughter designated if patient were incapacitated.  Husband had prostate surgery.  Daughter is coming to help out.    Still on nameda and donepezil at baseline.  Saw neuro yesterday.  Patient can occ get startled.    H/o knee replacement B.  Occ limp noted.     Pleasant but short answers.

## 2022-07-06 NOTE — Patient Instructions (Signed)
Go to the lab on the way out.   If you have mychart we'll likely use that to update you.    Take care.  Glad to see you. 

## 2022-07-08 NOTE — Assessment & Plan Note (Signed)
Recheck labs pending regarding B12 deficiency.

## 2022-07-08 NOTE — Assessment & Plan Note (Signed)
H/o osteoporosis.  Recheck vit D pending.  It may be reasonable to defer DXA at this point.

## 2022-07-08 NOTE — Assessment & Plan Note (Signed)
H/o osteoporosis.  Recheck vit D pending.  It may be reasonable to defer DXA at this point.   Defer colon screening given her memory loss. Would defer mammogram, d/w pt and daughter.   Advance directive- husband and daughter designated if patient were incapacitated.

## 2022-07-08 NOTE — Assessment & Plan Note (Signed)
Fortunately with unremarkable knee exam today.  Update me as needed.

## 2022-07-08 NOTE — Assessment & Plan Note (Signed)
Advance directive- husband and daughter designated if patient were incapacitated.  °

## 2022-07-08 NOTE — Assessment & Plan Note (Signed)
History of memory loss.  Still on nameda and donepezil at baseline.  Would continue as is.  Saw neuro yesterday.  Patient can occ get startled but otherwise she is tolerating her current medications and family is helping her manage her slow decline in memory loss.  She is still ambulatory.  She continues on B12 replacement.

## 2022-07-11 ENCOUNTER — Telehealth: Payer: Self-pay | Admitting: Family Medicine

## 2022-07-11 ENCOUNTER — Other Ambulatory Visit: Payer: Self-pay | Admitting: *Deleted

## 2022-07-11 DIAGNOSIS — F03B0 Unspecified dementia, moderate, without behavioral disturbance, psychotic disturbance, mood disturbance, and anxiety: Secondary | ICD-10-CM

## 2022-07-11 MED ORDER — DONEPEZIL HCL 10 MG PO TABS
10.0000 mg | ORAL_TABLET | Freq: Every day | ORAL | 3 refills | Status: DC
Start: 2022-07-11 — End: 2023-05-13

## 2022-07-11 NOTE — Telephone Encounter (Signed)
Patient called regarding lab results.I relayed lab message to her and scheduled her for her next b12 injection.

## 2022-07-11 NOTE — Telephone Encounter (Signed)
Last seen on 07/05/22  Follow up scheduled on 01/14/23

## 2022-07-13 DIAGNOSIS — M160 Bilateral primary osteoarthritis of hip: Secondary | ICD-10-CM | POA: Diagnosis not present

## 2022-07-13 DIAGNOSIS — M169 Osteoarthritis of hip, unspecified: Secondary | ICD-10-CM | POA: Insufficient documentation

## 2022-07-17 ENCOUNTER — Ambulatory Visit (INDEPENDENT_AMBULATORY_CARE_PROVIDER_SITE_OTHER): Payer: Medicare Other

## 2022-07-17 DIAGNOSIS — E538 Deficiency of other specified B group vitamins: Secondary | ICD-10-CM

## 2022-07-17 MED ORDER — CYANOCOBALAMIN 1000 MCG/ML IJ SOLN
1000.0000 ug | Freq: Once | INTRAMUSCULAR | Status: AC
Start: 2022-07-17 — End: 2022-07-17
  Administered 2022-07-17: 1000 ug via INTRAMUSCULAR

## 2022-07-17 NOTE — Progress Notes (Signed)
Per orders of Dr. Crawford Givens, injection of Vitamin B12 given by Gabriel Rainwater in right deltoid. Patient tolerated injection well. Patient will make appointment for every 21 days.

## 2022-07-31 ENCOUNTER — Ambulatory Visit: Payer: Medicare Other

## 2022-08-07 ENCOUNTER — Ambulatory Visit (INDEPENDENT_AMBULATORY_CARE_PROVIDER_SITE_OTHER): Payer: Medicare Other

## 2022-08-07 DIAGNOSIS — E538 Deficiency of other specified B group vitamins: Secondary | ICD-10-CM

## 2022-08-07 MED ORDER — CYANOCOBALAMIN 1000 MCG/ML IJ SOLN
1000.0000 ug | Freq: Once | INTRAMUSCULAR | Status: AC
Start: 2022-08-07 — End: 2022-08-07
  Administered 2022-08-07: 1000 ug via INTRAMUSCULAR

## 2022-08-07 NOTE — Progress Notes (Signed)
Per orders of Dr. Graham Duncan, injection of B-12 given by Danai Gotto in left deltoid. Patient tolerated injection well.    

## 2022-08-22 ENCOUNTER — Encounter: Payer: Self-pay | Admitting: Family Medicine

## 2022-08-30 ENCOUNTER — Ambulatory Visit (INDEPENDENT_AMBULATORY_CARE_PROVIDER_SITE_OTHER): Payer: Medicare Other

## 2022-08-30 DIAGNOSIS — E538 Deficiency of other specified B group vitamins: Secondary | ICD-10-CM

## 2022-08-30 MED ORDER — CYANOCOBALAMIN 1000 MCG/ML IJ SOLN
1000.0000 ug | Freq: Once | INTRAMUSCULAR | Status: AC
Start: 2022-08-30 — End: 2022-08-30
  Administered 2022-08-30: 1000 ug via INTRAMUSCULAR

## 2022-08-30 NOTE — Progress Notes (Signed)
Per orders of Dr. Crawford Givens, injection of vitamin b 12 given by [name] in right deltoid. Patient tolerated injection well. Patient will make appointment for 3 week.

## 2022-09-20 ENCOUNTER — Ambulatory Visit: Payer: Medicare Other

## 2022-09-20 DIAGNOSIS — E538 Deficiency of other specified B group vitamins: Secondary | ICD-10-CM

## 2022-09-20 MED ORDER — CYANOCOBALAMIN 1000 MCG/ML IJ SOLN
1000.0000 ug | Freq: Once | INTRAMUSCULAR | Status: AC
Start: 2022-09-20 — End: 2022-09-20
  Administered 2022-09-20: 1000 ug via INTRAMUSCULAR

## 2022-09-20 NOTE — Progress Notes (Signed)
Per orders of Dr. [name] is out of office and Alice Maes NP who is in office injection of vitamin b 12  given by [name] in left deltoid.Patient tolerated injection well. Patient will make appointment for 3 week.

## 2022-10-16 ENCOUNTER — Ambulatory Visit: Payer: Medicare Other

## 2022-10-17 ENCOUNTER — Ambulatory Visit: Payer: Medicare Other

## 2022-10-17 DIAGNOSIS — E538 Deficiency of other specified B group vitamins: Secondary | ICD-10-CM

## 2022-10-17 MED ORDER — CYANOCOBALAMIN 1000 MCG/ML IJ SOLN
1000.0000 ug | Freq: Once | INTRAMUSCULAR | Status: AC
Start: 2022-10-17 — End: 2022-10-17
  Administered 2022-10-17: 1000 ug via INTRAMUSCULAR

## 2022-10-17 NOTE — Progress Notes (Signed)
Per orders of Dr. Para March, injection of every 21 days B12 1000 mcg/ml given by Eual Fines, CMA. Patient tolerated injection well.  Forwarding to Dr Sharen Hones in Dr Lianne Bushy absence.

## 2022-11-07 ENCOUNTER — Ambulatory Visit: Payer: Medicare Other

## 2022-11-20 ENCOUNTER — Ambulatory Visit (INDEPENDENT_AMBULATORY_CARE_PROVIDER_SITE_OTHER): Payer: Medicare Other

## 2022-11-20 DIAGNOSIS — E538 Deficiency of other specified B group vitamins: Secondary | ICD-10-CM

## 2022-11-20 MED ORDER — CYANOCOBALAMIN 1000 MCG/ML IJ SOLN
1000.0000 ug | Freq: Once | INTRAMUSCULAR | Status: AC
Start: 2022-11-20 — End: 2022-11-20
  Administered 2022-11-20: 1000 ug via INTRAMUSCULAR

## 2022-11-20 NOTE — Progress Notes (Signed)
Per orders of Dr. Para March, injection of every 21 days B12 1000 mcg/ml given by Eual Fines, CMA in Left Deltoid. Patient tolerated injection well.

## 2022-12-11 ENCOUNTER — Ambulatory Visit (INDEPENDENT_AMBULATORY_CARE_PROVIDER_SITE_OTHER): Payer: Medicare Other

## 2022-12-11 ENCOUNTER — Ambulatory Visit: Payer: Medicare Other

## 2022-12-11 DIAGNOSIS — E538 Deficiency of other specified B group vitamins: Secondary | ICD-10-CM | POA: Diagnosis not present

## 2022-12-11 MED ORDER — CYANOCOBALAMIN 1000 MCG/ML IJ SOLN
1000.0000 ug | Freq: Once | INTRAMUSCULAR | Status: AC
Start: 2022-12-11 — End: 2022-12-11
  Administered 2022-12-11: 1000 ug via INTRAMUSCULAR

## 2022-12-11 NOTE — Progress Notes (Signed)
Patient presented for B 12 injection given by Makinzy Cleere, CMA to right deltoid, patient voiced no concerns nor showed any signs of distress during injection.  

## 2022-12-25 ENCOUNTER — Encounter: Payer: Self-pay | Admitting: Family Medicine

## 2023-01-09 NOTE — Progress Notes (Deleted)
PATIENT: Alice Smith DOB: 02-15-1946  REASON FOR VISIT: follow up HISTORY FROM: patient  No chief complaint on file.    HISTORY OF PRESENT ILLNESS:  01/09/23 ALL:  Alice Smith returns for follow up for dementia. She was last seen 06/2022 and stable on memantine and donepezil. Since,   07/05/2022 ALL:  Alice Smith returns for follow up for dementia. She was last seen 12/2021 and doing fairly well. She continues donepezil 10mg  QHS and memantine 10mg  BID. She continues to do well. No significant changes. She is tolerating meds. Sleeping well. Less active. No falls. She continues to assist with most ADLs. She is drinking Smith water. She continues to go to church on occasion and will go with Alice Smith to the grocery store. No very active, otherwise.   12/28/2021 ALL: Alice Smith returns for follow up for dementia. She continues donepezil 10mg  QHS and memantine 10mg  BID. She presents with her daughter, Alice Smith, who aids in history. No significant changes since last visit. She seems to tolerate meds. She is needing Smith assistance with ADLs. She seems to eat fairly well but does push the plate away. Alice does offer the food again and eventually she will eat. She is sleeping well. She goes to bed around 10 and wakes at 7. She sleeps through the night. She needs assistance with showers and dressing. She is unable to control bowel or bladder function. She is wearing a depend. She is not as active. She will freeze for a few seconds from time to time. Concerns for pain were raised a few times but she will say "ouch" at inappropriate times. History of arthritis. Multiple orthopedic surgeries. She is not taking as much. She does continue to go to church when she can.   05/17/2021 ALL: Alice Smith returns for follow up for dementia. She continues Namenda and Aricept. Memory seems to have declined some. She is having Smith difficulty with conversations. She does not initiate conversation but usually able to participate  if someone speaks to her first. She continues to need assistance with most ADLs. Able to get dressed. Alice helps with bathing but she is able to wash her hair. Appetite is good. They have some concerns of her binge eating if not supervised. She is sleeping really well.   She continues close follow up with PCP. She was seen yesterday for chronic cough, bowel incontinence, urinary frequency and recent fall. She was prescribed Hycodan and advised to continue Mucinex. Labs were unremarkable but unable to obtain UA. She wears depends.   11/16/2020 ALL:  Alice Smith returns for follow up for dementia. She continues Namenda and Aricept. Memory seems stable. No significant changes. She is sleeping well. Remains mostly independent with ADLs but does need help preparing meals and medications.  She had Covid about a month ago. She had mild symptoms. She took antiviral and recovered well. She continues to walk three times a day. She is followed closely by PCP.   05/17/2020 ALL:  Alice Smith returns for follow up for dementia. She continues Aricept and Namenda. She presents with Alice Smith, her daughter, who aids in history. Memory waxes and wanes. Seems happy but mostly confused. She does not complain of any pain unless an area (knees, toes) are manipulated. She needs to be reminded to try to use the restroom. Occasionally has accidents. She will not use public restroom. No UTIs. Family reminds her to drink fluids. Miralax is helping with constipation. She showers independently. Some assistance with getting dressed. She will eat if food  is provided but she rarely initiates meals. She will pick up a snack from the counter. She continues to walk 1-2 miles daily. No difficulty with gait. No falls. Has 7 children. Alice and children provide 24/7 supervision. Youngest son (of 7 children) still lives at home.   11/18/2019 ALL:  Alice Smith is a 77 y.o. female here today for follow up for dementia. She continues Aricept and  Namenda. She is tolerating medications well. Alice Smith is helping to dose medications. She continues ADL's independently. She is eating and drinking normally. She has had several episode of bowel incontinence. She is being treated for constipation. This has helped. She is not comfortable using public restrooms. Memory waxes and wanes. She continues to have lack of interest. She has a flat affect. No changes in movement. She is walking 2 miles a day. She continues to go to church.   HISTORY: (copied from my note on 02/23/2019)  Alice Smith is a 77 y.o. female here today for follow up for dementia. She continues Aricept 10mg  daily and Namenda 10mg  BID. She is tolerating medications well. Her Alice is with her today and aids in history. He reports that memory is fairly stable. They have noticed some worsening with concentration. She is wanting to sit still Smith than usual. She continues to walk about 1 mile several times a week. She has had Smith urine and bowel incontinence. She has had two accidents over the past 6 months. She is wearing pads. She is eating well. She does seem to enjoy simple sugars. She is able to perform ADL's. She does not cook. She does not drive. No falls.    HISTORY: (copied from my note on 08/11/2018)   Alice Smith is a 77 y.o. female here today for follow up of dementia.  [name] is accompanied by her Alice, Alice. Smith, who aids in obtaining history.  He reports that overall she has been fairly stable since her last visit in June 2019.  Unfortunately there seems to be some confusion on what medication she was taking. At her last visit, she was instructed to continue Namenda 10 mg twice daily.  Dr. Marjory Lies added Aricept 10 mg daily (titrating from 5mg  daily x 1 week).  They are unsure how long she continued Namenda.  It appears that it was discontinued from her medication list.  Alice. Jollie does not feel that she has taken this in some time.  She did tolerate the medication  well.  She continues to perform all ADLs at home.  She is able to dress bathe and feed herself.  He reports that she can remember events from long ago but struggles with short-term information.  She does very rarely drive when accompanied by a family member.  Her Alice reports that this is only short distances and he either rides with her or follows her.  He denies any falls or injuries.  No behavioral concerns at this time.   HISTORY (copied from Dr Richrd Humbles note on 08/06/2017)    UPDATE (08/06/17, VRP): Since last visit, doing slightly worse. Tolerating memantine. No alleviating or aggravating factors. Driving less now. Some diff with shopping. MMSE 19/30. Myrtis Ser 6/6. [place] IADL 5/8.    PRIOR HPI (02/04/17): 77 year old left-handed female here for evaluation of cognitive decline, confusion, lack of emotions, difficulty processing information.   Family has noted at least 2 years of gradual onset progressive confusion, fatigue, decreased processing speed, comprehension difficulty, decreased interest in day-to-day activity specifically household chores  and cleanliness, which she used to be quite involved with.  She is also shown flat and emotional affect, less nurturing, less social.  She has had trouble with maintaining her checkbook, finances, shopping and ordering.  In June 2018 she got lost in [place] when trying to pick up their grandson.   Overall patient having decreased speech output and fluency.   Patient also underwent 7 or 8 surgeries in 2014, and family noted that many symptoms started at that time   REVIEW OF SYSTEMS: Out of a complete 14 system review of symptoms, the patient complains only of the following symptoms, memory loss, incontinence, cough, recent fall and all other reviewed systems are negative.   ALLERGIES: Allergies  Allergen Reactions   Clarithromycin     REACTION: nausea and vomiting   Erythromycin Ethylsuccinate     REACTION: GI upset    Fluticasone-Salmeterol     REACTION: Sore tongue and rash   Procaine Hcl     REACTION: swelling   Sulfonamide Derivatives     REACTION: rash   Topiramate     REACTION: confusion    HOME MEDICATIONS: Outpatient Medications Prior to Visit  Medication Sig Dispense Refill   Bacillus Coagulans-Inulin (PROBIOTIC) 1-250 BILLION-MG CAPS Take by mouth daily.     calcium-vitamin D (OSCAL WITH D) 500-200 MG-UNIT tablet Take 1 tablet by mouth daily.     Cholecalciferol (DIALYVITE VITAMIN D 5000) 125 MCG (5000 UT) capsule Take 1 capsule (5,000 Units total) by mouth daily.     cyanocobalamin (,VITAMIN B-12,) 1000 MCG/ML injection Inject 1 mL (1,000 mcg total) into the muscle every 21 ( twenty-one) days. 1 mL    donepezil (ARICEPT) 10 MG tablet Take 1 tablet (10 mg total) by mouth at bedtime. 90 tablet 3   famotidine (PEPCID) 20 MG tablet Take 1 tablet (20 mg total) by mouth 2 (two) times daily. 180 tablet 1   loratadine (CLARITIN) 10 MG tablet Take 10 mg by mouth daily.     memantine (NAMENDA) 10 MG tablet Take 1 tablet (10 mg total) by mouth 2 (two) times daily. 180 tablet 3   No facility-administered medications prior to visit.    PAST MEDICAL HISTORY: Past Medical History:  Diagnosis Date   Arthritis    Breast mass 05/2001   benign   Complication of anesthesia    slow to wake up x 1   Concussion 2014, 05/2016   Dry cough    at night   Hemorrhoids    Migraine    evaluated with Dr. Neale Burly 11/2009   Osteoporosis     PAST SURGICAL HISTORY: Past Surgical History:  Procedure Laterality Date   ABDOMINAL EXPLORATION SURGERY     for twisted bowel   ABDOMINAL HYSTERECTOMY     yrs ago   BREAST SURGERY  1989   aspirated lump left breast   CARPAL TUNNEL RELEASE     right 03/2001, left 07/2001   ESOPHAGOGASTRODUODENOSCOPY     KNEE ARTHROSCOPY     rt knee   nissenfundiplication     PARTIAL HYSTERECTOMY  1989   dyspasia, ovaries left in   PARTIAL KNEE ARTHROPLASTY Right 06/23/2013    Procedure: RIGHT KNEE MEDIAL UNICOMPARTMENTAL ARTHROPLASTY;  Surgeon: Shelda Pal, MD;  Location: [facility];  Service: Orthopedics;  Laterality: Right;   PARTIAL KNEE ARTHROPLASTY Left 08/31/2013   Procedure: LEFT UNICOMPARTMENTAL KNEE ATHROPLASTY   (MEDIAL) ;  Surgeon: Shelda Pal, MD;  Location: [facility];  Service: Orthopedics;  Laterality: Left;  ROTATOR CUFF REPAIR     right 03/2001, left 07/2001    FAMILY HISTORY: Family History  Problem Relation Age of Onset   Arthritis Mother    Cancer Mother        lung cancer, dx'd in her 68s   Cancer Father        throat   COPD Brother    Cancer Sister        breast   Breast cancer Sister    Breast cancer Sister    Colon cancer Neg Hx     SOCIAL HISTORY: Social History   Socioeconomic History   Marital status: Married    Spouse name: Not on file   Number of children: 3   Years of education: Not on file   Highest education level: Not on file  Occupational History   Occupation: home  Tobacco Use   Smoking status: Former    Current packs/day: 0.00    Average packs/day: 1 pack/day for 5.0 years (5.0 ttl pk-yrs)    Types: Cigarettes    Start date: 06/16/1973    Quit date: 06/17/1978    Years since quitting: 44.5   Smokeless tobacco: Never   Tobacco comments:    5 pack year history  Substance and Sexual Activity   Alcohol use: No   Drug use: No   Sexual activity: Not on file  Other Topics Concern   Not on file  Social History Narrative   Second marriage, 4. Has 3 adult children, adopted 4, 6 grandkids.   From [place], [place]   Education- GED, some college   Social Determinants of Health   Financial Resource Strain: Low Risk  (04/25/2022)   Overall Financial Resource Strain (CARDIA)    Difficulty of Paying Living Expenses: Not hard at all  Food Insecurity: No Food Insecurity (04/25/2022)   Hunger Vital Sign    Worried About Running Out of Food in the Last Year: Never true    Ran Out of Food in the Last Year: Never true   Transportation Needs: No Transportation Needs (04/25/2022)   PRAPARE - Administrator, Civil Service (Medical): No    Lack of Transportation (Non-Medical): No  Physical Activity: Inactive (04/25/2022)   Exercise Vital Sign    Days of Exercise per Week: 0 days    Minutes of Exercise per Session: 0 min  Stress: No Stress Concern Present (07/06/2019)   [employer] of Occupational Health - Occupational Stress Questionnaire    Feeling of Stress : Not at all  Social Connections: Moderately Integrated (04/25/2022)   Social Connection and Isolation Panel [NHANES]    Frequency of Communication with Friends and Family: Three times a week    Frequency of Social Gatherings with Friends and Family: Smith than three times a week    Attends Religious Services: Smith than 4 times per year    Active Member of [employer] or Organizations: No    Attends Banker Meetings: Never    Marital Status: Married  Catering manager Violence: Not At Risk (04/25/2022)   Humiliation, Afraid, Rape, and Kick questionnaire    Fear of Current or Ex-Partner: No    Emotionally Abused: No    Physically Abused: No    Sexually Abused: No      PHYSICAL EXAM  There were no vitals filed for this visit.      There is no height or weight on file to calculate BMI.  Generalized: Well developed, in no acute distress, flat  affect  Cardiology: normal rate and rhythm, no murmur noted Respiratory: diffuse wheezing of upper and lower lobes, bilaterally, with auscultation of posterior lung fields. No acute dyspnea. Patient coughs throughout visit. Nonproductive. O2 99%.  Neurological examination  Mentation: Alert, Unable to complete MMSE, not oriented to time or place, not able to assist with history taking. Follows all commands speech and language fluent.  Cranial nerve II-XII: Pupils were equal round reactive to light. Extraocular movements were full, visual field were full on confrontational test. Facial  sensation and strength were normal. Head turning and shoulder shrug  were normal and symmetric. Motor: The motor testing reveals 5 over 5 strength of all 4 extremities. Good symmetric motor tone is noted throughout. No bradykinesia noted. No cogwheel rigidity.  Unable to test coordination.  Gait and station: Gait is normal. Normal posture.    DIAGNOSTIC DATA (LABS, IMAGING, TESTING) - I reviewed patient records, labs, notes, testing and imaging myself where available.     05/17/2020    2:33 PM 11/18/2019    1:56 PM 07/06/2019    9:04 AM  MMSE - Mini Mental State Exam  Not completed: Unable to complete  Refused  Orientation to time  1   Orientation to Place  2   Registration  3   Attention/ Calculation  0   Recall  2   Language- name 2 objects  2   Language- repeat  1   Language- follow 3 step command  3   Language- read & follow direction  0   Write a sentence  0   Copy design  0   Total score  14      Lab Results  Component Value Date   WBC 5.5 07/06/2022   HGB 14.1 07/06/2022   HCT 41.4 07/06/2022   MCV 90.9 07/06/2022   PLT 222.0 07/06/2022      Component Value Date/Time   NA 139 07/06/2022 1112   K 3.9 07/06/2022 1112   CL 104 07/06/2022 1112   CO2 28 07/06/2022 1112   GLUCOSE 96 07/06/2022 1112   BUN 15 07/06/2022 1112   CREATININE 0.80 07/06/2022 1112   CREATININE 0.81 07/25/2018 1527   CALCIUM 9.1 07/06/2022 1112   PROT 6.7 07/06/2022 1112   ALBUMIN 3.8 07/06/2022 1112   AST 20 07/06/2022 1112   ALT 16 07/06/2022 1112   ALKPHOS 79 07/06/2022 1112   BILITOT 0.7 07/06/2022 1112   GFRNONAA 90 (L) 09/01/2013 0452   GFRAA >90 09/01/2013 0452   Lab Results  Component Value Date   CHOL 216 (H) 03/24/2021   HDL 50.10 03/24/2021   LDLCALC 132 (H) 03/24/2021   LDLDIRECT 129.8 10/01/2011   TRIG 170.0 (H) 03/24/2021   CHOLHDL 4 03/24/2021   No results found for: "HGBA1C" Lab Results  Component Value Date   VITAMINB12 845 07/06/2022   Lab Results   Component Value Date   TSH 3.18 07/06/2022       ASSESSMENT AND PLAN 77 y.o. year old female  has a past medical history of Arthritis, Breast mass (05/2001), Complication of anesthesia, Concussion (2014, 05/2016), Dry cough, Hemorrhoids, Migraine, and Osteoporosis. here with   No diagnosis found.    Jaylenne is doing fairly well from memory standpoint. Family continues to note decline in conversation and activity but she seems happy. We will continue Aricept and Namenda as prescribed. I have discussed clinical progression and disease process with her daughter. We will focus on safety and comfort. May consider [facility] services  if needed in the future. She was encouraged to continue healthy lifestyle habits. She will continue close follow up with PCP. She will stay active. She will follow up with me in 6-12 months.    No orders of the defined types were placed in this encounter.     No orders of the defined types were placed in this encounter.   I spent 30 minutes of face-to-face and non-face-to-face time with patient.  This included previsit chart review, lab review, study review, order entry, electronic health record documentation, patient education.    Shawnie Dapper, FNP-C 01/09/2023, 3:28 PM [facility] [address] [place], [place] [address] [phone]

## 2023-01-14 ENCOUNTER — Ambulatory Visit: Payer: Medicare Other | Admitting: Family Medicine

## 2023-01-14 ENCOUNTER — Ambulatory Visit (INDEPENDENT_AMBULATORY_CARE_PROVIDER_SITE_OTHER): Payer: Medicare Other

## 2023-01-14 DIAGNOSIS — E538 Deficiency of other specified B group vitamins: Secondary | ICD-10-CM | POA: Diagnosis not present

## 2023-01-14 DIAGNOSIS — F03B0 Unspecified dementia, moderate, without behavioral disturbance, psychotic disturbance, mood disturbance, and anxiety: Secondary | ICD-10-CM

## 2023-01-14 MED ORDER — CYANOCOBALAMIN 1000 MCG/ML IJ SOLN
1000.0000 ug | Freq: Once | INTRAMUSCULAR | Status: AC
  Administered 2023-01-14: 1000 ug via INTRAMUSCULAR

## 2023-01-14 NOTE — Progress Notes (Signed)
Patient presented for B 12 injection given by Rhylan Kagel, CMA to left deltoid, patient voiced no concerns nor showed any signs of distress during injection.  

## 2023-01-29 ENCOUNTER — Other Ambulatory Visit: Payer: Self-pay

## 2023-01-30 ENCOUNTER — Other Ambulatory Visit: Payer: Self-pay | Admitting: *Deleted

## 2023-01-30 MED ORDER — MEMANTINE HCL 10 MG PO TABS: 10.0000 mg | ORAL_TABLET | Freq: Two times a day (BID) | ORAL | 0 refills | Status: DC

## 2023-01-30 NOTE — Telephone Encounter (Signed)
Last seen on 07/05/22 No follow up scheduled

## 2023-02-05 ENCOUNTER — Ambulatory Visit (INDEPENDENT_AMBULATORY_CARE_PROVIDER_SITE_OTHER): Payer: Medicare Other

## 2023-02-05 ENCOUNTER — Ambulatory Visit: Payer: Medicare Other

## 2023-02-05 DIAGNOSIS — E538 Deficiency of other specified B group vitamins: Secondary | ICD-10-CM

## 2023-02-05 MED ORDER — CYANOCOBALAMIN 1000 MCG/ML IJ SOLN
1000.0000 ug | Freq: Once | INTRAMUSCULAR | Status: AC
  Administered 2023-02-05: 1000 ug via INTRAMUSCULAR

## 2023-02-05 NOTE — Progress Notes (Signed)
Per orders of Dr. Crawford Givens, injection of B-12 given by Melina Copa in right deltoid. Patient tolerated injection well. Patient will make appointment for 3 weeks.

## 2023-02-27 ENCOUNTER — Ambulatory Visit: Payer: Medicare Other

## 2023-02-27 DIAGNOSIS — E538 Deficiency of other specified B group vitamins: Secondary | ICD-10-CM | POA: Diagnosis not present

## 2023-02-27 MED ORDER — CYANOCOBALAMIN 1000 MCG/ML IJ SOLN
1000.0000 ug | Freq: Once | INTRAMUSCULAR | Status: AC
  Administered 2023-02-27: 1000 ug via INTRAMUSCULAR

## 2023-02-27 NOTE — Progress Notes (Signed)
 Per orders of Dr. Crawford Givens, injection of B-12 given by Leonor Liv in left deltoid. Patient tolerated injection well. Patient will make appointment for 3 week.

## 2023-03-21 ENCOUNTER — Telehealth: Payer: Self-pay | Admitting: Family Medicine

## 2023-03-21 ENCOUNTER — Ambulatory Visit: Payer: Medicare Other | Admitting: *Deleted

## 2023-03-21 DIAGNOSIS — E538 Deficiency of other specified B group vitamins: Secondary | ICD-10-CM

## 2023-03-21 MED ORDER — CYANOCOBALAMIN 1000 MCG/ML IJ SOLN
1000.0000 ug | Freq: Once | INTRAMUSCULAR | Status: AC
  Administered 2023-03-21: 1000 ug via INTRAMUSCULAR

## 2023-03-21 NOTE — Telephone Encounter (Signed)
Please schedule yearly visit for the spring, when possible.  We can do labs at the visit if needed, wouldn't need to fast. Thanks.

## 2023-03-21 NOTE — Progress Notes (Signed)
Per orders of Dr. Crawford Givens, injection of Vitamin B12 given by Lorel Monaco in right deltoid. Patient tolerated injection well.

## 2023-03-22 NOTE — Telephone Encounter (Signed)
Called pt daughter answer and stated she will call back to schedule appt for her mom when she gets home this afternoon

## 2023-03-25 NOTE — Telephone Encounter (Signed)
 [email or web address]

## 2023-04-01 ENCOUNTER — Other Ambulatory Visit: Payer: Self-pay

## 2023-04-01 MED ORDER — MEMANTINE HCL 10 MG PO TABS: 10.0000 mg | ORAL_TABLET | Freq: Two times a day (BID) | ORAL | 0 refills | Status: DC

## 2023-04-09 ENCOUNTER — Encounter: Payer: Self-pay | Admitting: Dermatology

## 2023-04-09 ENCOUNTER — Ambulatory Visit: Payer: Medicare Other | Admitting: Dermatology

## 2023-04-09 DIAGNOSIS — L821 Other seborrheic keratosis: Secondary | ICD-10-CM

## 2023-04-09 DIAGNOSIS — L853 Xerosis cutis: Secondary | ICD-10-CM | POA: Diagnosis not present

## 2023-04-09 DIAGNOSIS — W908XXA Exposure to other nonionizing radiation, initial encounter: Secondary | ICD-10-CM | POA: Diagnosis not present

## 2023-04-09 DIAGNOSIS — L578 Other skin changes due to chronic exposure to nonionizing radiation: Secondary | ICD-10-CM | POA: Diagnosis not present

## 2023-04-09 DIAGNOSIS — Z1283 Encounter for screening for malignant neoplasm of skin: Secondary | ICD-10-CM

## 2023-04-09 DIAGNOSIS — L814 Other melanin hyperpigmentation: Secondary | ICD-10-CM | POA: Diagnosis not present

## 2023-04-09 DIAGNOSIS — D225 Melanocytic nevi of trunk: Secondary | ICD-10-CM | POA: Diagnosis not present

## 2023-04-09 DIAGNOSIS — D229 Melanocytic nevi, unspecified: Secondary | ICD-10-CM | POA: Diagnosis not present

## 2023-04-09 DIAGNOSIS — L299 Pruritus, unspecified: Secondary | ICD-10-CM

## 2023-04-09 DIAGNOSIS — S20419A Abrasion of unspecified back wall of thorax, initial encounter: Secondary | ICD-10-CM

## 2023-04-09 DIAGNOSIS — L300 Nummular dermatitis: Secondary | ICD-10-CM | POA: Diagnosis not present

## 2023-04-09 DIAGNOSIS — D1801 Hemangioma of skin and subcutaneous tissue: Secondary | ICD-10-CM

## 2023-04-09 DIAGNOSIS — T148XXA Other injury of unspecified body region, initial encounter: Secondary | ICD-10-CM

## 2023-04-09 MED ORDER — TRIAMCINOLONE ACETONIDE 0.1 % EX CREA: TOPICAL_CREAM | CUTANEOUS | 2 refills | Status: AC

## 2023-04-09 NOTE — Patient Instructions (Addendum)
Gentle Skin Care Guide  1. Bathe no more than once a day.  2. Avoid bathing in hot water  3. Use a mild soap like Dove, Vanicream, Cetaphil, CeraVe. Can use Lever 2000 or Cetaphil antibacterial soap  4. Use soap only where you need it. On most days, use it under your arms, between your legs, and on your feet. Let the water rinse other areas unless visibly dirty.  5. When you get out of the bath/shower, use a towel to gently blot your skin dry, don't rub it.  6. While your skin is still a little damp, apply a moisturizing cream such as Vanicream, CeraVe, Cetaphil, Eucerin, Sarna lotion or plain Vaseline Jelly. For hands apply Neutrogena [place] Hand Cream or Excipial Hand Cream.  7. Reapply moisturizer any time you start to itch or feel dry.  8. Sometimes using free and clear laundry detergents can be helpful. Fabric softener sheets should be avoided. Downy Free & Gentle liquid, or any liquid fabric softener that is free of dyes and perfumes, it acceptable to use  9. If your doctor has given you prescription creams you may apply moisturizers over them   Start triamcinolone 0.1% cream 1-2 times daily as needed for itch. Avoid applying to face, groin, and axilla. Use as directed. Long-term use can cause thinning of the skin.  Topical steroids (such as triamcinolone, fluocinolone, fluocinonide, mometasone, clobetasol, halobetasol, betamethasone, hydrocortisone) can cause thinning and lightening of the skin if they are used for too long in the same area. Your physician has selected the right strength medicine for your problem and area affected on the body. Please use your medication only as directed by your physician to prevent side effects.   Due to recent changes in healthcare laws, you may see results of your pathology and/or laboratory studies on MyChart before the doctors have had a chance to review them. We understand that in some cases there may be results that are confusing or concerning  to you. Please understand that not all results are received at the same time and often the doctors may need to interpret multiple results in order to provide you with the best plan of care or course of treatment. Therefore, we ask that you please give [place] 2 business days to thoroughly review all your results before contacting the office for clarification. Should we see a critical lab result, you will be contacted sooner.   If You Need Anything After Your Visit  If you have any questions or concerns for your doctor, please call our main line at [phone] and press option 4 to reach your doctor's medical assistant. If no one answers, please leave a voicemail as directed and we will return your call as soon as possible. Messages left after 4 pm will be answered the following business day.   You may also send [place] a message via MyChart. We typically respond to MyChart messages within 1-2 business days.  For prescription refills, please ask your pharmacy to contact our office. Our fax number is [phone].  If you have an urgent issue when the clinic is closed that cannot wait until the next business day, you can page your doctor at the number below.    Please note that while we do our best to be available for urgent issues outside of office hours, we are not available 24/7.   If you have an urgent issue and are unable to reach [place], you may choose to seek medical care at your doctor's office,  retail clinic, urgent care center, or emergency room.  If you have a medical emergency, please immediately call 911 or go to the emergency department.  Pager Numbers  - Dr. Gwen Pounds: [phone]  - Dr. Roseanne Reno: [phone]  - Dr. Katrinka Blazing: [phone]   In the event of inclement weather, please call our main line at [phone] for an update on the status of any delays or closures.  Dermatology Medication Tips: Please keep the boxes that topical medications come in in order to help keep track of the  instructions about where and how to use these. Pharmacies typically print the medication instructions only on the boxes and not directly on the medication tubes.   If your medication is too expensive, please contact our office at [phone] option 4 or send [place] a message through MyChart.   We are unable to tell what your co-pay for medications will be in advance as this is different depending on your insurance coverage. However, we may be able to find a substitute medication at lower cost or fill out paperwork to get insurance to cover a needed medication.   If a prior authorization is required to get your medication covered by your insurance company, please allow [place] 1-2 business days to complete this process.  Drug prices often vary depending on where the prescription is filled and some pharmacies may offer cheaper prices.  The website www.goodrx.com contains coupons for medications through different pharmacies. The prices here do not account for what the cost may be with help from insurance (it may be cheaper with your insurance), but the website can give you the price if you did not use any insurance.  - You can print the associated coupon and take it with your prescription to the pharmacy.  - You may also stop by our office during regular business hours and pick up a GoodRx coupon card.  - If you need your prescription sent electronically to a different pharmacy, notify our office through [facility] or by phone at [phone] option 4.     Si Usted Necesita Algo Despus de Su Visita  Tambin puede enviarnos un mensaje a travs de Clinical cytogeneticist. Por lo general respondemos a los mensajes de MyChart en el transcurso de 1 a 2 das hbiles.  Para renovar recetas, por favor pida a su farmacia que se ponga en contacto con nuestra oficina. Annie Sable de fax es [place] [phone].  Si tiene un asunto urgente cuando la clnica est cerrada y que no puede esperar hasta el siguiente da hbil,  puede llamar/localizar a su doctor(a) al nmero que aparece a continuacin.   Por favor, tenga en cuenta que aunque hacemos todo lo posible para estar disponibles para asuntos urgentes fuera del horario de [place], no estamos disponibles las 24 horas del da, los 7 [address]  [address] de la [place].   Si tiene un problema urgente y no puede comunicarse con nosotros, puede optar por buscar atencin mdica  en el consultorio de su doctor(a), en una clnica privada, en un centro de atencin urgente o en una sala de emergencias.  Si tiene Engineer, drilling, por favor llame inmediatamente al 911 o vaya a la sala de emergencias.  Nmeros de bper  - Dr. Gwen Pounds: [phone]  - Dra. Roseanne Reno: [phone]  - Dr. Katrinka Blazing: [phone]   En caso de inclemencias del tiempo, por favor llame a Lacy Duverney principal al [phone] para una actualizacin sobre el [place] de cualquier retraso o cierre.  Consejos para la medicacin en dermatologa: Por  favor, guarde las cajas en las que vienen los medicamentos de uso tpico para ayudarle a seguir las instrucciones sobre dnde y cmo usarlos. Las farmacias generalmente imprimen las instrucciones del medicamento slo en las cajas y no directamente en los tubos del [place].   Si su medicamento es muy caro, por favor, pngase en contacto con Rolm Gala llamando al [phone] y presione la opcin 4 o envenos un mensaje a travs de Clinical cytogeneticist.   No podemos decirle cul ser su copago por los medicamentos por adelantado ya que esto es diferente dependiendo de la cobertura de su seguro. Sin embargo, es posible que podamos encontrar un medicamento sustituto a Audiological scientist un formulario para que el seguro cubra el medicamento que se considera necesario.   Si se requiere una autorizacin previa para que su compaa de seguros [place] su medicamento, por favor permtanos de 1 a 2 das hbiles para completar [address]  Los precios de los medicamentos varan  con frecuencia dependiendo del Environmental consultant de dnde se surte la receta y alguna farmacias pueden ofrecer precios ms baratos.  El sitio web www.goodrx.com tiene cupones para medicamentos de Health and safety inspector. Los precios aqu no tienen en cuenta lo que podra costar con la ayuda del seguro (puede ser ms barato con su seguro), pero el sitio web puede darle el precio si no utiliz Tourist information centre manager.  - Puede imprimir el cupn correspondiente y llevarlo con su receta a la farmacia.  - Tambin puede pasar por nuestra oficina durante el horario de atencin regular y Education officer, museum una tarjeta de cupones de GoodRx.  - Si necesita que su receta se enve electrnicamente a una farmacia diferente, informe a nuestra oficina a travs de MyChart de [place] o por telfono llamando al [phone] y presione la opcin 4.

## 2023-04-09 NOTE — Progress Notes (Signed)
Follow-Up Visit   Subjective  Alice Smith is a 78 y.o. female who presents for the following: Skin Cancer Screening and Full Body Skin Exam  The patient presents for Total-Body Skin Exam (TBSE) for skin cancer screening and mole check. The patient has spots, moles and lesions to be evaluated, some may be new or changing and the patient may have concern these could be cancer. Some itching on back.  No hx skin cancer. Patient accompanied by husband who contributes to history. Pt has dementia and is poor historian.  The following portions of the chart were reviewed this encounter and updated as appropriate: medications, allergies, medical history  Review of Systems:  No other skin or systemic complaints except as noted in HPI or Assessment and Plan.  Objective  Well appearing patient in no apparent distress; mood and affect are within normal limits.  A full examination was performed including scalp, head, eyes, ears, nose, lips, neck, chest, axillae, abdomen, back, buttocks, bilateral upper extremities, bilateral lower extremities, hands, feet, fingers, toes, fingernails, and toenails. All findings within normal limits unless otherwise noted below.   Relevant physical exam findings are noted in the Assessment and Plan.  Mid Back Pink excoriated papules Left Lateral Lower Back 6.25mm med brown macule with mild scale/erythema c/w recent healing excoriation.  Benign features under dermoscopy. Size stable from previous visit   Assessment & Plan   SKIN CANCER SCREENING PERFORMED TODAY.  ACTINIC DAMAGE - Chronic condition, secondary to cumulative UV/sun exposure - diffuse scaly erythematous macules with underlying dyspigmentation - Recommend daily broad spectrum sunscreen SPF 30+ to sun-exposed areas, reapply every 2 hours as needed.  - Staying in the shade or wearing long sleeves, sun glasses (UVA+UVB protection) and wide brim hats (4-inch brim around the entire circumference of the  hat) are also recommended for sun protection.  - Call for new or changing lesions.  LENTIGINES, SEBORRHEIC KERATOSES, HEMANGIOMAS - Benign normal skin lesions - Benign-appearing - Call for any changes  MELANOCYTIC NEVI - Tan-brown and/or pink-flesh-colored symmetric macules and papules - Benign appearing on exam today - Observation - Call clinic for new or changing moles - Recommend daily use of broad spectrum spf 30+ sunscreen to sun-exposed areas.   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 Condition is bothersome/symptomatic for patient. Currently flared.   Nummular dermatitis (eczema) is a chronic, relapsing, pruritic condition that can significantly affect quality of life. It is often associated with dry skin can require treatment with prescription topical medications, in addition to dry skin care.  If there is underlying atopic dermatitis and topicals are not working, then biologic injections may be necessary to control symptoms.   Treatment Plan:  Start TMC 0.1% cr 1-2 times daily as needed for itch. Avoid applying to face, groin, and axilla. Use as directed. Long-term use can cause thinning of the skin.  Topical steroids (such as triamcinolone, fluocinolone, fluocinonide, mometasone, clobetasol, halobetasol, betamethasone, hydrocortisone) can cause thinning and lightening of the skin if they are used for too long in the same area. Your physician has selected the right strength medicine for your problem and area affected on the body. Please use your medication only as directed by your physician to prevent side effects.   Recommend mild  soap and moisturizing cream 1-2 times daily.  Gentle skin care  handout provided.   triamcinolone cream (KENALOG) 0.1 % - Mid Back Apply 1-2 times daily to AA PRN itch. Avoid applying to face, groin, and axilla. Use as directed. Long-term use can cause thinning of the skin. MELANOCYTIC NEVUS OF TRUNK Left Lateral Lower Back Benign-appearing. Stable compared to previous visit. Observation.  Call clinic for new or changing moles.  Recommend daily use of broad spectrum spf 30+ sunscreen to sun-exposed areas  Return in about 6 months (around 10/07/2023) for recheck nevus, with Dr. Roseanne Reno.   Documentation: I have reviewed the above documentation for accuracy and completeness, and I agree with the above.  Willeen Niece, MD

## 2023-04-11 ENCOUNTER — Ambulatory Visit: Payer: Medicare Other

## 2023-04-11 ENCOUNTER — Ambulatory Visit (INDEPENDENT_AMBULATORY_CARE_PROVIDER_SITE_OTHER): Payer: Medicare Other

## 2023-04-11 DIAGNOSIS — E538 Deficiency of other specified B group vitamins: Secondary | ICD-10-CM

## 2023-04-11 MED ORDER — CYANOCOBALAMIN 1000 MCG/ML IJ SOLN
1000.0000 ug | Freq: Once | INTRAMUSCULAR | Status: AC
  Administered 2023-04-11: 1000 ug via INTRAMUSCULAR

## 2023-04-11 NOTE — Progress Notes (Signed)
 Per orders of Dr. Crawford Givens, injection of B-12 given by Nanci Pina in left deltoid. Patient tolerated injection well.

## 2023-04-16 ENCOUNTER — Ambulatory Visit: Payer: Medicare Other

## 2023-04-30 ENCOUNTER — Ambulatory Visit (INDEPENDENT_AMBULATORY_CARE_PROVIDER_SITE_OTHER): Payer: Medicare Other

## 2023-04-30 VITALS — Ht 61.0 in | Wt 163.0 lb

## 2023-04-30 DIAGNOSIS — Z0000 Encounter for general adult medical examination without abnormal findings: Secondary | ICD-10-CM | POA: Diagnosis not present

## 2023-04-30 NOTE — Patient Instructions (Signed)
 Ms. Schrodt , Thank you for taking time to come for your Medicare Wellness Visit. I appreciate your ongoing commitment to your health goals. Please review the following plan we discussed and let me know if I can assist you in the future.   Referrals/Orders/Follow-Ups/Clinician Recommendations: none  This is a list of the screening recommended for you and due dates:  Health Maintenance  Topic Date Due   Medicare Annual Wellness Visit  04/29/2024   DTaP/Tdap/Td vaccine (3 - Td or Tdap) 09/08/2024   Pneumonia Vaccine  Completed   Flu Shot  Completed   DEXA scan (bone density measurement)  Completed   Hepatitis C Screening  Completed   Zoster (Shingles) Vaccine  Completed   HPV Vaccine  Aged Out   Colon Cancer Screening  Discontinued   COVID-19 Vaccine  Discontinued    Advanced directives: (Copy Requested) Please bring a copy of your health care power of attorney and living will to the office to be added to your chart at your convenience. You can mail to [facility] [address] [place], [place] [address] or email to [email or web address]  Next Medicare Annual Wellness Visit scheduled for next year: Yes 04/30/24 @ 2:20pm televisit

## 2023-04-30 NOTE — Progress Notes (Signed)
 Subjective:   ACHOL AZPEITIA is a 78 y.o. who presents for a Medicare Wellness preventive visit.  Visit Complete: Virtual I connected with Reginia Forts (husband and caretaker) regarding TAMARAH BHULLAR on 04/30/23 by a audio enabled telemedicine application and verified that I am speaking with the correct person using two identifiers.  Patient Location: Home  Provider Location: Home Office  I discussed the limitations of evaluation and management by telemedicine. The patient expressed understanding and agreed to proceed.  Vital Signs: Because this visit was a virtual/telehealth visit, some criteria may be missing or patient reported. Any vitals not documented were not able to be obtained and vitals that have been documented are patient reported.  VideoDeclined- This patient declined Librarian, academic. Therefore the visit was completed with audio only.  AWV Questionnaire: No: Patient Medicare AWV questionnaire was not completed prior to this visit.  Cardiac Risk Factors include: advanced age (>63men, >53 women);dyslipidemia;obesity (BMI >30kg/m2);sedentary lifestyle     Objective:    Today's Vitals   04/30/23 1446  Weight: 163 lb (73.9 kg)  Height: 5\' 1"  (1.549 m)   Body mass index is 30.8 kg/m.     04/30/2023    3:00 PM 04/25/2022   11:26 AM 07/06/2019    9:01 AM 07/23/2016    3:29 PM 08/31/2013   12:50 PM 08/28/2013   11:18 AM 06/23/2013    1:15 PM  Advanced Directives  Does Patient Have a Medical Advance Directive? Yes Yes Yes No Patient has advance directive, copy not in chart Patient has advance directive, copy not in chart Patient has advance directive, copy not in chart  Type of Advance Directive Healthcare Power of [place];Living will Healthcare Power of [place];Living will Healthcare Power of [place];Living will  Healthcare Power of [place];Living will Healthcare Power of [place];Living will Healthcare Power of [place];Living will   Does patient want to make changes to medical advance directive?     No change requested No change requested   Copy of Healthcare Power of Attorney in Chart? No - copy requested No - copy requested No - copy requested  Copy requested from family Copy requested from family Copy requested from family  Would patient like information on creating a medical advance directive?    No - Patient declined     Pre-existing out of facility DNR order (yellow form or pink MOST form)     No No No    Current Medications (verified) Outpatient Encounter Medications as of 04/30/2023  Medication Sig   Bacillus Coagulans-Inulin (PROBIOTIC) 1-250 BILLION-MG CAPS Take by mouth daily.   calcium-vitamin D (OSCAL WITH D) 500-200 MG-UNIT tablet Take 1 tablet by mouth daily.   Cholecalciferol (DIALYVITE VITAMIN D 5000) 125 MCG (5000 UT) capsule Take 1 capsule (5,000 Units total) by mouth daily.   cyanocobalamin (,VITAMIN B-12,) 1000 MCG/ML injection Inject 1 mL (1,000 mcg total) into the muscle every 21 ( twenty-one) days.   donepezil (ARICEPT) 10 MG tablet Take 1 tablet (10 mg total) by mouth at bedtime.   famotidine (PEPCID) 20 MG tablet Take 1 tablet (20 mg total) by mouth 2 (two) times daily.   loratadine (CLARITIN) 10 MG tablet Take 10 mg by mouth daily.   memantine (NAMENDA) 10 MG tablet Take 1 tablet (10 mg total) by mouth 2 (two) times daily.   triamcinolone cream (KENALOG) 0.1 % Apply 1-2 times daily to AA PRN itch. Avoid applying to face, groin, and axilla. Use as directed. Long-term use can cause  thinning of the skin.   No facility-administered encounter medications on file as of 04/30/2023.    Allergies (verified) Clarithromycin, Erythromycin ethylsuccinate, Fluticasone-salmeterol, Procaine hcl, Sulfonamide derivatives, and Topiramate   History: Past Medical History:  Diagnosis Date   Arthritis    Breast mass 05/2001   benign   Complication of anesthesia    slow to wake up x 1   Concussion 2014, 05/2016    Dry cough    at night   Hemorrhoids    Migraine    evaluated with Dr. Neale Burly 11/2009   Osteoporosis    Past Surgical History:  Procedure Laterality Date   ABDOMINAL EXPLORATION SURGERY     for twisted bowel   ABDOMINAL HYSTERECTOMY     yrs ago   BREAST SURGERY  1989   aspirated lump left breast   CARPAL TUNNEL RELEASE     right 03/2001, left 07/2001   ESOPHAGOGASTRODUODENOSCOPY     KNEE ARTHROSCOPY     rt knee   nissenfundiplication     PARTIAL HYSTERECTOMY  1989   dyspasia, ovaries left in   PARTIAL KNEE ARTHROPLASTY Right 06/23/2013   Procedure: RIGHT KNEE MEDIAL UNICOMPARTMENTAL ARTHROPLASTY;  Surgeon: Shelda Pal, MD;  Location: [facility];  Service: Orthopedics;  Laterality: Right;   PARTIAL KNEE ARTHROPLASTY Left 08/31/2013   Procedure: LEFT UNICOMPARTMENTAL KNEE ATHROPLASTY   (MEDIAL) ;  Surgeon: Shelda Pal, MD;  Location: [facility];  Service: Orthopedics;  Laterality: Left;   ROTATOR CUFF REPAIR     right 03/2001, left 07/2001   Family History  Problem Relation Age of Onset   Arthritis Mother    Cancer Mother        lung cancer, dx'd in her 4s   Cancer Father        throat   COPD Brother    Cancer Sister        breast   Breast cancer Sister    Breast cancer Sister    Colon cancer Neg Hx    Social History   Socioeconomic History   Marital status: Married    Spouse name: Not on file   Number of children: 3   Years of education: Not on file   Highest education level: Not on file  Occupational History   Occupation: home  Tobacco Use   Smoking status: Former    Current packs/day: 0.00    Average packs/day: 1 pack/day for 5.0 years (5.0 ttl pk-yrs)    Types: Cigarettes    Start date: 06/16/1973    Quit date: 06/17/1978    Years since quitting: 44.8   Smokeless tobacco: Never   Tobacco comments:    5 pack year history  Substance and Sexual Activity   Alcohol use: No   Drug use: No   Sexual activity: Not on file  Other Topics Concern   Not on file   Social History Narrative   Second marriage, 67. Has 3 adult children, adopted 4, 6 grandkids.   From [place], [place]   Education- GED, some college   Social Drivers of Health   Financial Resource Strain: Low Risk  (04/30/2023)   Overall Financial Resource Strain (CARDIA)    Difficulty of Paying Living Expenses: Not hard at all  Food Insecurity: No Food Insecurity (04/30/2023)   Hunger Vital Sign    Worried About Running Out of Food in the Last Year: Never true    Ran Out of Food in the Last Year: Never true  Transportation Needs: No  Transportation Needs (04/30/2023)   PRAPARE - Administrator, Civil Service (Medical): No    Lack of Transportation (Non-Medical): No  Physical Activity: Inactive (04/30/2023)   Exercise Vital Sign    Days of Exercise per Week: 0 days    Minutes of Exercise per Session: 0 min  Stress: No Stress Concern Present (04/30/2023)   [employer] of Occupational Health - Occupational Stress Questionnaire    Feeling of Stress : Not at all  Social Connections: Socially Isolated (04/30/2023)   Social Connection and Isolation Panel [NHANES]    Frequency of Communication with Friends and Family: Never    Frequency of Social Gatherings with Friends and Family: Once a week    Attends Religious Services: Never    Database administrator or Organizations: No    Attends Engineer, structural: Never    Marital Status: Married    Tobacco Counseling Counseling given: Not Answered Tobacco comments: 5 pack year history  Clinical Intake:  Pre-visit preparation completed: Yes  Pain : No/denies pain Husband says no c/o pain from wife    BMI - recorded: 35.33 Nutritional Status: BMI > 30  Obese Nutritional Risks: None Diabetes: No  How often do you need to have someone help you when you read instructions, pamphlets, or other written materials from your doctor or pharmacy?: 1 - Never  Interpreter Needed?: No  Comments: lives with  husband Information entered by :: B.Cherelle Midkiff,LPN   Activities of Daily Living     04/30/2023    2:58 PM  In your present state of health, do you have any difficulty performing the following activities:  Hearing? 0  Vision? 0  Difficulty concentrating or making decisions? 1  Walking or climbing stairs? 1  Dressing or bathing? 1  Doing errands, shopping? 1  Preparing Food and eating ? Y  Using the Toilet? Y  In the past six months, have you accidently leaked urine? Y  Do you have problems with loss of bowel control? Y  Managing your Medications? Y  Managing your Finances? Y  Housekeeping or managing your Housekeeping? Y    Patient Care Team: Joaquim Nam, MD as PCP - General Lomax, Amy, NP as Nurse Practitioner (Family Medicine)  Indicate any recent Medical Services you may have received from other than [facility] providers in the past year (date may be approximate).     Assessment:   This is a routine wellness examination for [place].  Hearing/Vision screen Hearing Screening - Comments:: Pt husband says she has no problems hearing Vision Screening - Comments:: Pt husband says she can see well [facility]   Goals Addressed   None    Depression Screen     04/30/2023    2:55 PM 07/06/2022   10:38 AM 04/25/2022   11:23 AM 03/29/2022    3:14 PM 03/24/2021   12:06 PM 07/06/2019    9:01 AM 07/25/2018    3:58 PM  PHQ 2/9 Scores  PHQ - 2 Score 0   3  0 0  PHQ- 9 Score    12  0 0  Exception Documentation  Medical reason Patient refusal  Medical reason      Fall Risk     04/30/2023    2:52 PM 07/06/2022   10:38 AM 04/25/2022   11:27 AM 03/29/2022    3:14 PM 03/24/2021   12:06 PM  Fall Risk   Falls in the past year? 0 1 0 1 0  Number falls  in past yr: 0 0 0 0 0  Injury with Fall? 0 0 0 0 0  Risk for fall due to : No Fall Risks History of fall(s) No Fall Risks No Fall Risks No Fall Risks  Follow up Education provided;Falls prevention discussed Falls evaluation  completed Falls prevention discussed;Falls evaluation completed Falls evaluation completed Falls evaluation completed    MEDICARE RISK AT HOME:  Medicare Risk at Home Any stairs in or around the home?: No If so, are there any without handrails?: No Home free of loose throw rugs in walkways, pet beds, electrical cords, etc?: Yes Adequate lighting in your home to reduce risk of falls?: Yes Life alert?: No Use of a cane, walker or w/c?: No Grab bars in the bathroom?: Yes Shower chair or bench in shower?: Yes Elevated toilet seat or a handicapped toilet?: Yes  TIMED UP AND GO:  Was the test performed?  No  Cognitive Function: unable to complete    04/30/2023    2:59 PM 05/17/2020    2:33 PM 11/18/2019    1:56 PM 07/06/2019    9:04 AM 02/23/2019   10:02 AM  MMSE - Mini Mental State Exam  Not completed: Unable to complete Unable to complete  Refused   Orientation to time   1  4  Orientation to Place   2  3  Registration   3  3  Attention/ Calculation   0  0  Recall   2  3  Language- name 2 objects   2  2  Language- repeat   1  1  Language- follow 3 step command   3  3  Language- read & follow direction   0  0  Write a sentence   0  1  Copy design   0  1  Copy design-comments     named 3 animals  Total score   14  21        Immunizations Immunization History  Administered Date(s) Administered   Fluad Quad(high Dose 65+) 12/01/2018, 12/03/2019, 03/24/2021, 03/29/2022   Influenza Whole 12/18/2007, 11/19/2009   Influenza, High Dose Seasonal PF 10/09/2016, 12/09/2017   Influenza-Unspecified 10/20/2012, 12/20/2013, 11/26/2014   PFIZER(Purple Top)SARS-COV-2 Vaccination 03/10/2019, 03/31/2019, 01/06/2020, 09/12/2020   Pfizer(Comirnaty)Fall Seasonal Vaccine 12 years and older 11/20/2022   Pneumococcal Conjugate-13 08/31/2014   Pneumococcal Polysaccharide-23 11/27/2004, 10/12/2011   Rsv, Bivalent, Protein Subunit Rsvpref,pf (Abrysvo) 04/26/2022   Td 02/26/2003   Tdap 09/09/2014    Zoster Recombinant(Shingrix) 06/06/2020, 08/06/2020   Zoster, Live 01/29/2007    Screening Tests Health Maintenance  Topic Date Due   Medicare Annual Wellness (AWV)  04/29/2024   DTaP/Tdap/Td (3 - Td or Tdap) 09/08/2024   Pneumonia Vaccine 81+ Years old  Completed   INFLUENZA VACCINE  Completed   DEXA SCAN  Completed   Hepatitis C Screening  Completed   Zoster Vaccines- Shingrix  Completed   HPV VACCINES  Aged Out   Colonoscopy  Discontinued   COVID-19 Vaccine  Discontinued    Health Maintenance  There are no preventive care reminders to display for this patient.  Health Maintenance Items Addressed: None needed  Additional Screening:  Vision Screening: Recommended annual ophthalmology exams for early detection of glaucoma and other disorders of the eye.  Dental Screening: Recommended annual dental exams for proper oral hygiene  Community Resource Referral / Chronic Care Management: CRR required this visit?  No   CCM required this visit?  No    Plan:  I have personally reviewed and noted the following in the patient's chart:   Medical and social history Use of alcohol, tobacco or illicit drugs  Current medications and supplements including opioid prescriptions. Patient is not currently taking opioid prescriptions. Functional ability and status Nutritional status Physical activity Advanced directives List of other physicians Hospitalizations, surgeries, and ER visits in previous 12 months Vitals Screenings to include cognitive, depression, and falls Referrals and appointments  In addition, I have reviewed and discussed with patient certain preventive protocols, quality metrics, and best practice recommendations. A written personalized care plan for preventive services as well as general preventive health recommendations were provided to patient.    Sue Lush, LPN   1/61/0960   After Visit Summary: (MyChart) Due to this being a telephonic visit,  the after visit summary with patients personalized plan was offered to patient via MyChart   Notes: Nothing significant to report at this time.

## 2023-05-01 ENCOUNTER — Ambulatory Visit

## 2023-05-01 DIAGNOSIS — E538 Deficiency of other specified B group vitamins: Secondary | ICD-10-CM

## 2023-05-01 MED ORDER — CYANOCOBALAMIN 1000 MCG/ML IJ SOLN
500.0000 ug | Freq: Once | INTRAMUSCULAR | Status: AC
  Administered 2023-05-01: 500 ug via INTRAMUSCULAR

## 2023-05-01 MED ORDER — CYANOCOBALAMIN 1000 MCG/ML IJ SOLN: 1000.0000 ug | Freq: Once | INTRAMUSCULAR | Status: DC

## 2023-05-01 NOTE — Progress Notes (Addendum)
 Per orders of Mordecai Maes, NP , injection of B-12 given by Leonor Liv in right deltoid. Was only able to do .5ml due to patient not cooperating. Per spouse did not continue to administer the rest of the injection.

## 2023-05-02 ENCOUNTER — Ambulatory Visit: Payer: Medicare Other

## 2023-05-13 ENCOUNTER — Other Ambulatory Visit: Payer: Self-pay | Admitting: *Deleted

## 2023-05-13 DIAGNOSIS — F03B0 Unspecified dementia, moderate, without behavioral disturbance, psychotic disturbance, mood disturbance, and anxiety: Secondary | ICD-10-CM

## 2023-05-13 MED ORDER — DONEPEZIL HCL 10 MG PO TABS: 10.0000 mg | ORAL_TABLET | Freq: Every day | ORAL | 0 refills | Status: DC

## 2023-05-13 NOTE — Telephone Encounter (Signed)
Last seen on 07/05/22 No follow up scheduled

## 2023-05-22 ENCOUNTER — Ambulatory Visit (INDEPENDENT_AMBULATORY_CARE_PROVIDER_SITE_OTHER)

## 2023-05-22 DIAGNOSIS — E538 Deficiency of other specified B group vitamins: Secondary | ICD-10-CM

## 2023-05-22 MED ORDER — CYANOCOBALAMIN 1000 MCG/ML IJ SOLN
1000.0000 ug | Freq: Once | INTRAMUSCULAR | Status: AC
  Administered 2023-05-22: 1000 ug via INTRAMUSCULAR

## 2023-05-22 NOTE — Progress Notes (Signed)
 Per orders of Mordecai Maes, NP , injection of b12 given by Lonia Blood in left deltoid. Patient tolerated injection well. Patient will make appointment for 3 week.

## 2023-05-30 ENCOUNTER — Telehealth: Payer: Self-pay | Admitting: Family Medicine

## 2023-05-30 ENCOUNTER — Other Ambulatory Visit: Payer: Self-pay | Admitting: *Deleted

## 2023-05-30 MED ORDER — MEMANTINE HCL 10 MG PO TABS: 10.0000 mg | ORAL_TABLET | Freq: Two times a day (BID) | ORAL | 0 refills | Status: DC

## 2023-05-30 NOTE — Telephone Encounter (Signed)
 Received message from [place], [place] "Please call patient to get a follow up scheduled with amy,NP pt was suppose to come back in nov . Please make patient aware for further refills appointment is needed" LVM and sent mychart msg asking pt to call back to schedule follow up appointment.

## 2023-06-12 ENCOUNTER — Ambulatory Visit

## 2023-06-13 ENCOUNTER — Ambulatory Visit

## 2023-06-13 DIAGNOSIS — E538 Deficiency of other specified B group vitamins: Secondary | ICD-10-CM

## 2023-06-13 MED ORDER — CYANOCOBALAMIN 1000 MCG/ML IJ SOLN
1000.0000 ug | Freq: Once | INTRAMUSCULAR | Status: AC
Start: 2023-06-13 — End: 2023-06-13
  Administered 2023-06-13: 1000 ug via INTRAMUSCULAR

## 2023-06-13 NOTE — Progress Notes (Signed)
Patient presented for B 12 injection given by Makinzy Cleere, CMA to right deltoid, patient voiced no concerns nor showed any signs of distress during injection.  

## 2023-06-27 ENCOUNTER — Encounter (HOSPITAL_COMMUNITY): Payer: Self-pay

## 2023-06-30 ENCOUNTER — Other Ambulatory Visit: Payer: Self-pay | Admitting: Family Medicine

## 2023-06-30 DIAGNOSIS — M810 Age-related osteoporosis without current pathological fracture: Secondary | ICD-10-CM

## 2023-06-30 DIAGNOSIS — F03B0 Unspecified dementia, moderate, without behavioral disturbance, psychotic disturbance, mood disturbance, and anxiety: Secondary | ICD-10-CM

## 2023-06-30 DIAGNOSIS — E538 Deficiency of other specified B group vitamins: Secondary | ICD-10-CM

## 2023-07-01 ENCOUNTER — Other Ambulatory Visit (INDEPENDENT_AMBULATORY_CARE_PROVIDER_SITE_OTHER): Payer: Medicare Other

## 2023-07-01 ENCOUNTER — Encounter (HOSPITAL_COMMUNITY): Payer: Self-pay

## 2023-07-01 DIAGNOSIS — E538 Deficiency of other specified B group vitamins: Secondary | ICD-10-CM | POA: Diagnosis not present

## 2023-07-01 DIAGNOSIS — F03B0 Unspecified dementia, moderate, without behavioral disturbance, psychotic disturbance, mood disturbance, and anxiety: Secondary | ICD-10-CM

## 2023-07-01 DIAGNOSIS — M810 Age-related osteoporosis without current pathological fracture: Secondary | ICD-10-CM | POA: Diagnosis not present

## 2023-07-01 LAB — CBC WITH DIFFERENTIAL/PLATELET
Basophils Absolute: 0.1 10*3/uL (ref 0.0–0.1)
Basophils Relative: 1 % (ref 0.0–3.0)
Eosinophils Absolute: 0.4 10*3/uL (ref 0.0–0.7)
Eosinophils Relative: 5.9 % — ABNORMAL HIGH (ref 0.0–5.0)
HCT: 42.7 % (ref 36.0–46.0)
Hemoglobin: 14.5 g/dL (ref 12.0–15.0)
Lymphocytes Relative: 39.5 % (ref 12.0–46.0)
Lymphs Abs: 2.4 10*3/uL (ref 0.7–4.0)
MCHC: 34 g/dL (ref 30.0–36.0)
MCV: 94.3 fl (ref 78.0–100.0)
Monocytes Absolute: 0.4 10*3/uL (ref 0.1–1.0)
Monocytes Relative: 6.7 % (ref 3.0–12.0)
Neutro Abs: 2.9 10*3/uL (ref 1.4–7.7)
Neutrophils Relative %: 46.9 % (ref 43.0–77.0)
Platelets: 244 10*3/uL (ref 150.0–400.0)
RBC: 4.53 Mil/uL (ref 3.87–5.11)
RDW: 13.5 % (ref 11.5–15.5)
WBC: 6.1 10*3/uL (ref 4.0–10.5)

## 2023-07-01 LAB — COMPREHENSIVE METABOLIC PANEL WITH GFR
ALT: 10 U/L (ref 0–35)
AST: 14 U/L (ref 0–37)
Albumin: 3.9 g/dL (ref 3.5–5.2)
Alkaline Phosphatase: 84 U/L (ref 39–117)
BUN: 17 mg/dL (ref 6–23)
CO2: 29 meq/L (ref 19–32)
Calcium: 9.7 mg/dL (ref 8.4–10.5)
Chloride: 107 meq/L (ref 96–112)
Creatinine, Ser: 0.94 mg/dL (ref 0.40–1.20)
GFR: 58.28 mL/min — ABNORMAL LOW (ref >60.00)
Glucose, Bld: 94 mg/dL (ref 70–99)
Potassium: 4.2 meq/L (ref 3.5–5.1)
Sodium: 144 meq/L (ref 135–145)
Total Bilirubin: 0.8 mg/dL (ref 0.2–1.2)
Total Protein: 6.5 g/dL (ref 6.0–8.3)

## 2023-07-01 LAB — VITAMIN D 25 HYDROXY (VIT D DEFICIENCY, FRACTURES): VITD: 53.39 ng/mL (ref 30.00–100.00)

## 2023-07-01 LAB — TSH: TSH: 3.54 u[IU]/mL (ref 0.35–5.50)

## 2023-07-01 LAB — VITAMIN B12: Vitamin B-12: 775 pg/mL (ref 211–911)

## 2023-07-03 ENCOUNTER — Ambulatory Visit: Payer: Self-pay | Admitting: Family Medicine

## 2023-07-04 ENCOUNTER — Ambulatory Visit

## 2023-07-04 DIAGNOSIS — E538 Deficiency of other specified B group vitamins: Secondary | ICD-10-CM | POA: Diagnosis not present

## 2023-07-04 MED ORDER — CYANOCOBALAMIN 1000 MCG/ML IJ SOLN
1000.0000 ug | Freq: Once | INTRAMUSCULAR | Status: AC
  Administered 2023-07-04: 1000 ug via INTRAMUSCULAR

## 2023-07-04 NOTE — Progress Notes (Signed)
 Per orders of Dr. Crawford Givens, injection of B-12 given by Nanci Pina in left deltoid. Patient tolerated injection well.

## 2023-07-08 ENCOUNTER — Encounter: Payer: Medicare Other | Admitting: Family Medicine

## 2023-07-08 ENCOUNTER — Ambulatory Visit: Payer: Medicare Other

## 2023-07-10 ENCOUNTER — Other Ambulatory Visit: Payer: Self-pay | Admitting: *Deleted

## 2023-07-10 DIAGNOSIS — F03B0 Unspecified dementia, moderate, without behavioral disturbance, psychotic disturbance, mood disturbance, and anxiety: Secondary | ICD-10-CM

## 2023-07-10 NOTE — Telephone Encounter (Signed)
Last seen on 07/05/22 No follow up scheduled

## 2023-07-17 ENCOUNTER — Other Ambulatory Visit: Payer: Self-pay

## 2023-07-17 DIAGNOSIS — F03B0 Unspecified dementia, moderate, without behavioral disturbance, psychotic disturbance, mood disturbance, and anxiety: Secondary | ICD-10-CM

## 2023-07-18 ENCOUNTER — Encounter: Payer: Self-pay | Admitting: Family Medicine

## 2023-07-18 ENCOUNTER — Ambulatory Visit (INDEPENDENT_AMBULATORY_CARE_PROVIDER_SITE_OTHER): Admitting: Family Medicine

## 2023-07-18 VITALS — BP 130/80 | HR 74 | Temp 97.7°F | Ht 62.6 in | Wt 166.8 lb

## 2023-07-18 DIAGNOSIS — E538 Deficiency of other specified B group vitamins: Secondary | ICD-10-CM

## 2023-07-18 DIAGNOSIS — R634 Abnormal weight loss: Secondary | ICD-10-CM

## 2023-07-18 DIAGNOSIS — F03B0 Unspecified dementia, moderate, without behavioral disturbance, psychotic disturbance, mood disturbance, and anxiety: Secondary | ICD-10-CM | POA: Diagnosis not present

## 2023-07-18 DIAGNOSIS — Z7189 Other specified counseling: Secondary | ICD-10-CM

## 2023-07-18 DIAGNOSIS — Z0000 Encounter for general adult medical examination without abnormal findings: Secondary | ICD-10-CM

## 2023-07-18 DIAGNOSIS — R059 Cough, unspecified: Secondary | ICD-10-CM | POA: Diagnosis not present

## 2023-07-18 NOTE — Patient Instructions (Signed)
 You could try increasing protein intake in the meantime.  Update me as needed.  Take care.  Glad to see you.

## 2023-07-18 NOTE — Progress Notes (Addendum)
 Here today with daughter.  Still living at home with husband.  Daughter moved to [place], local now.    Recent labs d/w pt, unremarkable.    Still on B12.  Labs d/w pt.  On replacement no adverse effect on replacement.  Memory changes.  Now chewing pills.  This appears to be a habitual change for the patient.  Family is helping manage her meds.  She isn't on time release meds that would hazardous.  Had neuro f/u prev and will schedule f/u.  Patient has same/previous trend with decline.  She is compliant and kind with family.  Assist with showering, at baseline.  Still on routine at home at baseline.  Using depends with incontinence, with assist.  She fell at home once. Family has removed tripping hazards.  Cautions d/w pt. No injury.  She couldn't reliably operate an alert button but family is going to use an air tag.    Vaccines up to date.  Colon screening deferred.  Breast cancer screening deferred DXA deferred Advance directive- husband and daughter designated if patient were incapacitated.   Weight loss noted, some of this is expected with memory changes/progression.  No other obvious causes or sx noted.  Given the patient's memory change she is not able to make significant informed decisions but her daughter did not want to pursue invasive testing for workup as there is no thought that the patient would want to go through extensive workup/treatment.  We talked about trying to increase her protein intake.  She has throat clearing/cough at baseline.  This has not changed.  Meds, vitals, and allergies reviewed.   ROS: Per HPI unless specifically indicated in ROS section   GEN: nad, alert, speaking intermittently in short answers. HEENT: mucous membranes moist NECK: supple w/o LA CV: rrr.  PULM: ctab, no inc wob ABD: soft, +bs EXT: no edema SKIN: Well-perfused

## 2023-07-21 DIAGNOSIS — R634 Abnormal weight loss: Secondary | ICD-10-CM | POA: Insufficient documentation

## 2023-07-21 NOTE — Assessment & Plan Note (Signed)
 Vaccines up to date.  Colon screening deferred.  Breast cancer screening deferred DXA deferred Advance directive- husband and daughter designated if patient were incapacitated.

## 2023-07-21 NOTE — Assessment & Plan Note (Signed)
Advance directive- husband and daughter designated if patient were incapacitated.

## 2023-07-21 NOTE — Assessment & Plan Note (Signed)
 Weight loss noted, some of this is expected with memory changes/progression.  No other obvious causes or sx noted.  Given the patient's memory change she is not able to make significant informed decisions but her daughter did not want to pursue invasive testing for workup as there is no thought that the patient would want to go through extensive workup/treatment.  We talked about trying to increase her protein intake.

## 2023-07-21 NOTE — Assessment & Plan Note (Signed)
 At baseline, no change.  Lungs are clear.

## 2023-07-21 NOTE — Assessment & Plan Note (Signed)
 She gives short 1-2 word answers to some questions, but not always.  It appears that she had difficulty following the conversation today.  However she is still smiling and appears content.  She is able to follow basic commands, shake my hand, etc.  She has progression but I think it makes sense to continue as it is right now with supportive care at home along with her medications.  I do not think it is hazardous for her to chew her medications.  Family is trying to do what ever possible to keep her safe and content at home and I think this makes sense.  Family can help her follow-up with neurology.  I appreciate the help of all involved.

## 2023-07-21 NOTE — Assessment & Plan Note (Signed)
 Continue replacement

## 2023-07-25 ENCOUNTER — Ambulatory Visit

## 2023-07-30 ENCOUNTER — Ambulatory Visit (INDEPENDENT_AMBULATORY_CARE_PROVIDER_SITE_OTHER)

## 2023-07-30 DIAGNOSIS — E538 Deficiency of other specified B group vitamins: Secondary | ICD-10-CM

## 2023-07-30 MED ORDER — CYANOCOBALAMIN 1000 MCG/ML IJ SOLN
1000.0000 ug | Freq: Once | INTRAMUSCULAR | Status: AC
  Administered 2023-07-30: 1000 ug via INTRAMUSCULAR

## 2023-07-30 NOTE — Progress Notes (Signed)
Per orders of Dr. Crawford Givens, injection of B-12 given by Melina Copa in right deltoid. Patient tolerated injection well. Patient will make appointment for 1 month.

## 2023-07-31 ENCOUNTER — Other Ambulatory Visit: Payer: Self-pay

## 2023-07-31 DIAGNOSIS — F03B0 Unspecified dementia, moderate, without behavioral disturbance, psychotic disturbance, mood disturbance, and anxiety: Secondary | ICD-10-CM

## 2023-08-21 ENCOUNTER — Other Ambulatory Visit: Payer: Self-pay | Admitting: *Deleted

## 2023-08-21 NOTE — Telephone Encounter (Signed)
Last seen on 07/05/22 No follow up scheduled

## 2023-08-27 ENCOUNTER — Encounter: Payer: Self-pay | Admitting: Family Medicine

## 2023-08-27 ENCOUNTER — Ambulatory Visit (INDEPENDENT_AMBULATORY_CARE_PROVIDER_SITE_OTHER): Admitting: Family Medicine

## 2023-08-27 VITALS — BP 128/78 | HR 73 | Temp 97.3°F | Ht 62.5 in | Wt 167.2 lb

## 2023-08-27 DIAGNOSIS — E538 Deficiency of other specified B group vitamins: Secondary | ICD-10-CM

## 2023-08-27 MED ORDER — CYANOCOBALAMIN 1000 MCG/ML IJ SOLN
1000.0000 ug | Freq: Once | INTRAMUSCULAR | Status: AC
  Administered 2023-08-27: 1000 ug via INTRAMUSCULAR

## 2023-08-27 NOTE — Patient Instructions (Addendum)
 B12 dose today.   Next B12 dose at least 3 weeks from today, meaning July 29th or soon thereafter.  Nurse visit for the B12 shot.   Take care.  Glad to see you.

## 2023-08-27 NOTE — Progress Notes (Unsigned)
 Had B12 shot today.   She has cough at baseline.  Mobility and memory at recent baseline.   It appears that all she needed today was her B12 shot and it should have been scheduled as a nurse visit.  I was glad to see her anyway.  Discussed.  Reasonable to get next B12 shot as scheduled.  Meds, vitals, and allergies reviewed.   ROS: Per HPI unless specifically indicated in ROS section   Nad Speaking in short responses at baseline. Rrr Ctab Abdomen soft.

## 2023-08-28 NOTE — Assessment & Plan Note (Addendum)
 Should have been scheduled for a nurse visit.  No charge for visit with me. B12 dose today.   Next B12 dose at least 3 weeks from today, meaning July 29th or soon thereafter.  Nurse visit for the B12 shot.

## 2023-09-17 ENCOUNTER — Ambulatory Visit

## 2023-09-17 ENCOUNTER — Ambulatory Visit (INDEPENDENT_AMBULATORY_CARE_PROVIDER_SITE_OTHER)

## 2023-09-17 DIAGNOSIS — E538 Deficiency of other specified B group vitamins: Secondary | ICD-10-CM | POA: Diagnosis not present

## 2023-09-17 MED ORDER — CYANOCOBALAMIN 1000 MCG/ML IJ SOLN
1000.0000 ug | Freq: Once | INTRAMUSCULAR | Status: AC
  Administered 2023-09-17: 1000 ug via INTRAMUSCULAR

## 2023-09-17 NOTE — Progress Notes (Signed)
 Per orders of Dr. Cleatus, injection of every 21 days B12 1000 mcg/ml given by Clotilda SHAUNNA Pander, CMA in Right Deltoid. Patient tolerated injection well.

## 2023-10-09 ENCOUNTER — Ambulatory Visit

## 2023-10-29 ENCOUNTER — Ambulatory Visit: Payer: Medicare Other | Admitting: Dermatology

## 2023-11-12 NOTE — Patient Instructions (Signed)
Below is our plan:  We will continue donepezil 10mg and memantine 10mg daily  Please make sure you are staying well hydrated. I recommend 50-60 ounces daily. Well balanced diet and regular exercise encouraged. Consistent sleep schedule with 6-8 hours recommended.   Please continue follow up with care team as directed.   Follow up with me in 1 year   You may receive a survey regarding today's visit. I encourage you to leave honest feed back as I do use this information to improve patient care. Thank you for seeing me today!   Management of Memory Problems   There are some general things you can do to help manage your memory problems.  Your memory may not in fact recover, but by using techniques and strategies you will be able to manage your memory difficulties better.   1)  Establish a routine. Try to establish and then stick to a regular routine.  By doing this, you will get used to what to expect and you will reduce the need to rely on your memory.  Also, try to do things at the same time of day, such as taking your medication or checking your calendar first thing in the morning. Think about think that you can do as a part of a regular routine and make a list.  Then enter them into a daily planner to remind you.  This will help you establish a routine.   2)  Organize your environment. Organize your environment so that it is uncluttered.  Decrease visual stimulation.  Place everyday items such as keys or cell phone in the same place every day (ie.  Basket next to front door) Use post it notes with a brief message to yourself (ie. Turn off light, lock the door) Use labels to indicate where things go (ie. Which cupboards are for food, dishes, etc.) Keep a notepad and pen by the telephone to take messages   3)  Memory Aids A diary or journal/notebook/daily planner Making a list (shopping list, chore list, to do list that needs to be done) Using an alarm as a reminder (kitchen timer or cell  phone alarm) Using cell phone to store information (Notes, Calendar, Reminders) Calendar/White board placed in a prominent position Post-it notes   In order for memory aids to be useful, you need to have good habits.  It's no good remembering to make a note in your journal if you don't remember to look in it.  Try setting aside a certain time of day to look in journal.   4)  Improving mood and managing fatigue. There may be other factors that contribute to memory difficulties.  Factors, such as anxiety, depression and tiredness can affect memory. Regular gentle exercise can help improve your mood and give you more energy. Exercise: there are short videos created by the National Institute on Health specially for older adults: https://bit.ly/2I30q97.  Mediterranean diet: which emphasizes fruits, vegetables, whole grains, legumes, fish, and other seafood; unsaturated fats such as olive oils; and low amounts of red meat, eggs, and sweets. A variation of this, called MIND (Mediterranean-DASH Intervention for Neurodegenerative Delay) incorporates the DASH (Dietary Approaches to Stop Hypertension) diet, which has been shown to lower high blood pressure, a risk factor for Alzheimer's disease. More information at: https://www.nia.nih.gov/health/what-do-we-know-about-diet-and-prevention-alzheimers-disease.  Aerobic exercise that improve heart health is also good for the mind.  National Institute on Aging have short videos for exercises that you can do at home: www.nia.nih.gov/Go4Life Simple relaxation techniques may help relieve   symptoms of anxiety Try to get back to completing activities or hobbies you enjoyed doing in the past. Learn to pace yourself through activities to decrease fatigue. Find out about some local support groups where you can share experiences with others. Try and achieve 7-8 hours of sleep at night.   Resources for Family/Caregiver  Online caregiver support groups can be found at  alz.org or call Alzheimer's Association's 24/7 hotline: 800.272.3900. [facility] offers in-person, virtual support groups and individual counseling for both care partners and persons with memory loss. Call for more information at [phone].   Advanced care plan: there are two types of Power of Attorney: healthcare and durable. Healthcare POA is a designated person to make healthcare decisions on your behalf if you were too sick to make them yourself. This person can be selected and documented by your physician. Durable POA has to be set up with a lawyer who takes charge of your finances and estate if you were too sick or cognitively impaired to manage your finances accurately. You can find a local Elder Law lawyer here: https://www.naela.org/.  Check out www.planyourlifespan.org, which will help you plan before a crisis and decide who will take care of life considerations in a circumstance where you may not be able to speak for yourself.   Helpful books (available on Amazon or your local bookstore):  By Dr. Ed Shaw: Keeping Love Alive as Memories Fade: The 5 Love Languages and the Alzheimer's Journey Nov 20, 2014 The Dementia Care Partner's Workbook: A Guide for Understanding, Education, and Hope Paperback - July 20, 2017.  Both available for less than $15.   "Coping with behavior change in dementia: a family caregiver's guide" by Beth Spencer & Laurie White "A Caregiver's Guide to Dementia: Using Activities and Other Strategies to Prevent, Reduce and Manage Behavioral Symptoms" by Laura N. Gitlin and Catherine Piersol.  "Creating Moments of Joy for the Person with Alzheimer's or Dementia" 4th edition by Jolene Brackrey  Caregiver videos on common behaviors related to dementia: https://www.uclahealth.org/dementia/caregiver-education-videos  [facility] Caregiver Portal: free to sign up, links to local resources: [email or web address]-[email or web address]/login  

## 2023-11-12 NOTE — Progress Notes (Unsigned)
 PATIENT: OLIVEAH ZWACK DOB: 04-04-1945  REASON FOR VISIT: follow up HISTORY FROM: patient  No chief complaint on file.    HISTORY OF PRESENT ILLNESS:  11/12/23 ALL:  Angelle returns for follow up for dementia. She was last seen 06/2022 and doing well on donepezil  10mg  QHS and memantine  10mg  BID. Since,   07/05/2022 ALL:  Kambrey returns for follow up for dementia. She was last seen 12/2021 and doing fairly well. She continues donepezil  10mg  QHS and memantine  10mg  BID. She continues to do well. No significant changes. She is tolerating meds. Sleeping well. Less active. No falls. She continues to assist with most ADLs. She is drinking more water. She continues to go to church on occasion and will go with Mr Keatley to the grocery store. No very active, otherwise.   12/28/2021 ALL: Denita returns for follow up for dementia. She continues donepezil  10mg  QHS and memantine  10mg  BID. She presents with her daughter, Calleen, who aids in history. No significant changes since last visit. She seems to tolerate meds. She is needing more assistance with ADLs. She seems to eat fairly well but does push the plate away. Husband does offer the food again and eventually she will eat. She is sleeping well. She goes to bed around 10 and wakes at 7. She sleeps through the night. She needs assistance with showers and dressing. She is unable to control bowel or bladder function. She is wearing a depend. She is not as active. She will freeze for a few seconds from time to time. Concerns for pain were raised a few times but she will say ouch at inappropriate times. History of arthritis. Multiple orthopedic surgeries. She is not taking as much. She does continue to go to church when she can.   05/17/2021 ALL: Lameshia returns for follow up for dementia. She continues Namenda  and Aricept . Memory seems to have declined some. She is having more difficulty with conversations. She does not initiate conversation but  usually able to participate if someone speaks to her first. She continues to need assistance with most ADLs. Able to get dressed. Husband helps with bathing but she is able to wash her hair. Appetite is good. They have some concerns of her binge eating if not supervised. She is sleeping really well.   She continues close follow up with PCP. She was seen yesterday for chronic cough, bowel incontinence, urinary frequency and recent fall. She was prescribed Hycodan and advised to continue Mucinex. Labs were unremarkable but unable to obtain UA. She wears depends.   11/16/2020 ALL:  [name] returns for follow up for dementia. She continues Namenda  and Aricept . Memory seems stable. No significant changes. She is sleeping well. Remains mostly independent with ADLs but does need help preparing meals and medications.  She had Covid about a month ago. She had mild symptoms. She took antiviral and recovered well. She continues to walk three times a day. She is followed closely by PCP.   05/17/2020 ALL:  Zita returns for follow up for dementia. She continues Aricept  and Namenda . She presents with Calleen, her daughter, who aids in history. Memory waxes and wanes. Seems happy but mostly confused. She does not complain of any pain unless an area (knees, toes) are manipulated. She needs to be reminded to try to use the restroom. Occasionally has accidents. She will not use public restroom. No UTIs. Family reminds her to drink fluids. Miralax  is helping with constipation. She showers independently. Some assistance with getting dressed.  She will eat if food is provided but she rarely initiates meals. She will pick up a snack from the counter. She continues to walk 1-2 miles daily. No difficulty with gait. No falls. Has 7 children. Husband and children provide 24/7 supervision. Youngest son (of 7 children) still lives at home.   11/18/2019 ALL:  Nessa D Bohl is a 78 y.o. female here today for follow up for dementia.  She continues Aricept  and Namenda . She is tolerating medications well. Mr Mangen is helping to dose medications. She continues ADL's independently. She is eating and drinking normally. She has had several episode of bowel incontinence. She is being treated for constipation. This has helped. She is not comfortable using public restrooms. Memory waxes and wanes. She continues to have lack of interest. She has a flat affect. No changes in movement. She is walking 2 miles a day. She continues to go to church.   HISTORY: (copied from my note on 02/23/2019)  Samah D Dentremont is a 78 y.o. female here today for follow up for dementia. She continues Aricept  10mg  daily and Namenda  10mg  BID. She is tolerating medications well. Her husband is with her today and aids in history. He reports that memory is fairly stable. They have noticed some worsening with concentration. She is wanting to sit still more than usual. She continues to walk about 1 mile several times a week. She has had more urine and bowel incontinence. She has had two accidents over the past 6 months. She is wearing pads. She is eating well. She does seem to enjoy simple sugars. She is able to perform ADL's. She does not cook. She does not drive. No falls.    HISTORY: (copied from my note on 08/11/2018)   Sumiye D Connelley is a 78 y.o. female here today for follow up of dementia.  Tinleigh is accompanied by her husband, Mr. Rodda, who aids in obtaining history.  He reports that overall she has been fairly stable since her last visit in June 2019.  Unfortunately there seems to be some confusion on what medication she was taking. At her last visit, she was instructed to continue Namenda  10 mg twice daily.  Dr. Margaret added Aricept  10 mg daily (titrating from 5mg  daily x 1 week).  They are unsure how long she continued Namenda .  It appears that it was discontinued from her medication list.  Mr. Raybon does not feel that she has taken this in some time.  She  did tolerate the medication well.  She continues to perform all ADLs at home.  She is able to dress bathe and feed herself.  He reports that she can remember events from long ago but struggles with short-term information.  She does very rarely drive when accompanied by a family member.  Her husband reports that this is only short distances and he either rides with her or follows her.  He denies any falls or injuries.  No behavioral concerns at this time.   HISTORY (copied from Dr Chancy note on 08/06/2017)    UPDATE (08/06/17, VRP): Since last visit, doing slightly worse. Tolerating memantine . No alleviating or aggravating factors. Driving less now. Some diff with shopping. MMSE 19/30. Micky 6/6. [place] IADL 5/8.    PRIOR HPI (02/04/17): 78 year old left-handed female here for evaluation of cognitive decline, confusion, lack of emotions, difficulty processing information.   Family has noted at least 2 years of gradual onset progressive confusion, fatigue, decreased processing speed, comprehension difficulty, decreased interest in  day-to-day activity specifically household chores and cleanliness, which she used to be quite involved with.  She is also shown flat and emotional affect, less nurturing, less social.  She has had trouble with maintaining her checkbook, finances, shopping and ordering.  In June 2018 she got lost in [place] when trying to pick up their grandson.   Overall patient having decreased speech output and fluency.   Patient also underwent 7 or 8 surgeries in 2014, and family noted that many symptoms started at that time   REVIEW OF SYSTEMS: Out of a complete 14 system review of symptoms, the patient complains only of the following symptoms, memory loss, incontinence, cough, recent fall and all other reviewed systems are negative.   ALLERGIES: Allergies  Allergen Reactions   Clarithromycin     REACTION: nausea and vomiting   Erythromycin Ethylsuccinate     REACTION: GI  upset   Fluticasone -Salmeterol     REACTION: Sore tongue and rash   Procaine Hcl     REACTION: swelling   Sulfonamide Derivatives     REACTION: rash   Topiramate     REACTION: confusion    HOME MEDICATIONS: Outpatient Medications Prior to Visit  Medication Sig Dispense Refill   Bacillus Coagulans-Inulin (PROBIOTIC) 1-250 BILLION-MG CAPS Take by mouth daily.     calcium -vitamin D  (OSCAL WITH D) 500-200 MG-UNIT tablet Take 1 tablet by mouth daily.     Cholecalciferol  (DIALYVITE  VITAMIN D  5000) 125 MCG (5000 UT) capsule Take 1 capsule (5,000 Units total) by mouth daily.     cyanocobalamin  (,VITAMIN B-12,) 1000 MCG/ML injection Inject 1 mL (1,000 mcg total) into the muscle every 21 ( twenty-one) days. 1 mL    donepezil  (ARICEPT ) 10 MG tablet Take 1 tablet (10 mg total) by mouth at bedtime. 90 tablet 0   famotidine  (PEPCID ) 20 MG tablet Take 1 tablet (20 mg total) by mouth 2 (two) times daily. 180 tablet 1   loratadine (CLARITIN) 10 MG tablet Take 10 mg by mouth daily.     memantine  (NAMENDA ) 10 MG tablet Take 1 tablet (10 mg total) by mouth 2 (two) times daily. 180 tablet 0   triamcinolone  cream (KENALOG ) 0.1 % Apply 1-2 times daily to AA PRN itch. Avoid applying to face, groin, and axilla. Use as directed. Long-term use can cause thinning of the skin. 80 g 2   No facility-administered medications prior to visit.    PAST MEDICAL HISTORY: Past Medical History:  Diagnosis Date   Arthritis    Breast mass 05/2001   benign   Complication of anesthesia    slow to wake up x 1   Concussion 2014, 05/2016   Dry cough    at night   Hemorrhoids    Migraine    evaluated with Dr. Oneita 11/2009   Osteoporosis     PAST SURGICAL HISTORY: Past Surgical History:  Procedure Laterality Date   ABDOMINAL EXPLORATION SURGERY     for twisted bowel   ABDOMINAL HYSTERECTOMY     yrs ago   BREAST SURGERY  1989   aspirated lump left breast   CARPAL TUNNEL RELEASE     right 03/2001, left 07/2001    ESOPHAGOGASTRODUODENOSCOPY     KNEE ARTHROSCOPY     rt knee   nissenfundiplication     PARTIAL HYSTERECTOMY  1989   dyspasia, ovaries left in   PARTIAL KNEE ARTHROPLASTY Right 06/23/2013   Procedure: RIGHT KNEE MEDIAL UNICOMPARTMENTAL ARTHROPLASTY;  Surgeon: Donnice JONETTA Car, MD;  Location: [facility];  Service: Orthopedics;  Laterality: Right;   PARTIAL KNEE ARTHROPLASTY Left 08/31/2013   Procedure: LEFT UNICOMPARTMENTAL KNEE ATHROPLASTY   (MEDIAL) ;  Surgeon: Donnice JONETTA Car, MD;  Location: [facility];  Service: Orthopedics;  Laterality: Left;   ROTATOR CUFF REPAIR     right 03/2001, left 07/2001    FAMILY HISTORY: Family History  Problem Relation Age of Onset   Arthritis Mother    Cancer Mother        lung cancer, dx'd in her 57s   Cancer Father        throat   COPD Brother    Cancer Sister        breast   Breast cancer Sister    Breast cancer Sister    Colon cancer Neg Hx     SOCIAL HISTORY: Social History   Socioeconomic History   Marital status: Married    Spouse name: Not on file   Number of children: 3   Years of education: Not on file   Highest education level: Not on file  Occupational History   Occupation: home  Tobacco Use   Smoking status: Former    Current packs/day: 0.00    Average packs/day: 1 pack/day for 5.0 years (5.0 ttl pk-yrs)    Types: Cigarettes    Start date: 06/16/1973    Quit date: 06/17/1978    Years since quitting: 45.4   Smokeless tobacco: Never   Tobacco comments:    5 pack year history  Substance and Sexual Activity   Alcohol use: No   Drug use: No   Sexual activity: Not on file  Other Topics Concern   Not on file  Social History Narrative   Second marriage, 69. Has 3 adult children, adopted 4, 6 grandkids.   From [place], [place]   Education- GED, some college   Social Drivers of Health   Financial Resource Strain: Low Risk  (04/30/2023)   Overall Financial Resource Strain (CARDIA)    Difficulty of Paying Living Expenses: Not hard at  all  Food Insecurity: No Food Insecurity (04/30/2023)   Hunger Vital Sign    Worried About Running Out of Food in the Last Year: Never true    Ran Out of Food in the Last Year: Never true  Transportation Needs: No Transportation Needs (04/30/2023)   PRAPARE - Administrator, Civil Service (Medical): No    Lack of Transportation (Non-Medical): No  Physical Activity: Inactive (04/30/2023)   Exercise Vital Sign    Days of Exercise per Week: 0 days    Minutes of Exercise per Session: 0 min  Stress: No Stress Concern Present (04/30/2023)   [employer] of Occupational Health - Occupational Stress Questionnaire    Feeling of Stress : Not at all  Social Connections: Socially Isolated (04/30/2023)   Social Connection and Isolation Panel    Frequency of Communication with Friends and Family: Never    Frequency of Social Gatherings with Friends and Family: Once a week    Attends Religious Services: Never    Database administrator or Organizations: No    Attends Banker Meetings: Never    Marital Status: Married  Catering manager Violence: Not At Risk (04/30/2023)   Humiliation, Afraid, Rape, and Kick questionnaire    Fear of Current or Ex-Partner: No    Emotionally Abused: No    Physically Abused: No    Sexually Abused: No      PHYSICAL EXAM  There were  no vitals filed for this visit.      There is no height or weight on file to calculate BMI.  Generalized: Well developed, in no acute distress, flat affect  Cardiology: normal rate and rhythm, no murmur noted Respiratory: diffuse wheezing of upper and lower lobes, bilaterally, with auscultation of posterior lung fields. No acute dyspnea. Patient coughs throughout visit. Nonproductive. O2 99%.  Neurological examination  Mentation: Alert, Unable to complete MMSE, not oriented to time or place, not able to assist with history taking. Follows all commands speech and language fluent.  Cranial nerve II-XII:  Pupils were equal round reactive to light. Extraocular movements were full, visual field were full on confrontational test. Facial sensation and strength were normal. Head turning and shoulder shrug  were normal and symmetric. Motor: The motor testing reveals 5 over 5 strength of all 4 extremities. Good symmetric motor tone is noted throughout. No bradykinesia noted. No cogwheel rigidity.  Unable to test coordination.  Gait and station: Gait is normal. Normal posture.    DIAGNOSTIC DATA (LABS, IMAGING, TESTING) - I reviewed patient records, labs, notes, testing and imaging myself where available.     04/30/2023    2:59 PM 05/17/2020    2:33 PM 11/18/2019    1:56 PM  MMSE - Mini Mental State Exam  Not completed: Unable to complete Unable to complete   Orientation to time   1  Orientation to Place   2  Registration   3  Attention/ Calculation   0  Recall   2  Language- name 2 objects   2  Language- repeat   1  Language- follow 3 step command   3  Language- read & follow direction   0  Write a sentence   0  Copy design   0  Total score   14     Lab Results  Component Value Date   WBC 6.1 07/01/2023   HGB 14.5 07/01/2023   HCT 42.7 07/01/2023   MCV 94.3 07/01/2023   PLT 244.0 07/01/2023      Component Value Date/Time   NA 144 07/01/2023 0818   K 4.2 07/01/2023 0818   CL 107 07/01/2023 0818   CO2 29 07/01/2023 0818   GLUCOSE 94 07/01/2023 0818   BUN 17 07/01/2023 0818   CREATININE 0.94 07/01/2023 0818   CREATININE 0.81 07/25/2018 1527   CALCIUM  9.7 07/01/2023 0818   PROT 6.5 07/01/2023 0818   ALBUMIN 3.9 07/01/2023 0818   AST 14 07/01/2023 0818   ALT 10 07/01/2023 0818   ALKPHOS 84 07/01/2023 0818   BILITOT 0.8 07/01/2023 0818   GFRNONAA 90 (L) 09/01/2013 0452   GFRAA >90 09/01/2013 0452   Lab Results  Component Value Date   CHOL 216 (H) 03/24/2021   HDL 50.10 03/24/2021   LDLCALC 132 (H) 03/24/2021   LDLDIRECT 129.8 10/01/2011   TRIG 170.0 (H) 03/24/2021    CHOLHDL 4 03/24/2021   No results found for: HGBA1C Lab Results  Component Value Date   VITAMINB12 775 07/01/2023   Lab Results  Component Value Date   TSH 3.54 07/01/2023       ASSESSMENT AND PLAN 78 y.o. year old female  has a past medical history of Arthritis, Breast mass (05/2001), Complication of anesthesia, Concussion (2014, 05/2016), Dry cough, Hemorrhoids, Migraine, and Osteoporosis. here with   No diagnosis found.    Defne is doing fairly well from memory standpoint. Family continues to note decline in conversation and activity but  she seems happy. We will continue Aricept  and Namenda  as prescribed. I have discussed clinical progression and disease process with her daughter. We will focus on safety and comfort. May consider HH services if needed in the future. She was encouraged to continue healthy lifestyle habits. She will continue close follow up with PCP. She will stay active. She will follow up with me in 6-12 months.    No orders of the defined types were placed in this encounter.     No orders of the defined types were placed in this encounter.   I spent 30 minutes of face-to-face and non-face-to-face time with patient.  This included previsit chart review, lab review, study review, order entry, electronic health record documentation, patient education.    Greig Forbes, FNP-C 11/12/2023, 9:07 AM [facility] [address] [place], [place] [address] [phone]

## 2023-11-13 ENCOUNTER — Ambulatory Visit (INDEPENDENT_AMBULATORY_CARE_PROVIDER_SITE_OTHER): Admitting: Family Medicine

## 2023-11-13 ENCOUNTER — Encounter: Payer: Self-pay | Admitting: Family Medicine

## 2023-11-13 VITALS — BP 134/65 | HR 83 | Ht 61.0 in | Wt 160.0 lb

## 2023-11-13 DIAGNOSIS — F03B0 Unspecified dementia, moderate, without behavioral disturbance, psychotic disturbance, mood disturbance, and anxiety: Secondary | ICD-10-CM | POA: Diagnosis not present

## 2023-11-13 MED ORDER — DONEPEZIL HCL 10 MG PO TABS: 10.0000 mg | ORAL_TABLET | Freq: Every day | ORAL | 3 refills | Status: AC

## 2023-11-13 MED ORDER — MEMANTINE HCL 10 MG PO TABS: 10.0000 mg | ORAL_TABLET | Freq: Two times a day (BID) | ORAL | 3 refills | Status: AC

## 2023-11-26 ENCOUNTER — Ambulatory Visit: Admitting: Family Medicine

## 2023-12-30 ENCOUNTER — Encounter: Payer: Self-pay | Admitting: Family Medicine

## 2024-01-21 ENCOUNTER — Other Ambulatory Visit: Payer: Self-pay

## 2024-01-21 ENCOUNTER — Emergency Department
Admission: EM | Admit: 2024-01-21 | Discharge: 2024-01-21 | Disposition: A | Discharge status: Home / Self Care | Attending: Emergency Medicine | Admitting: Emergency Medicine

## 2024-01-21 ENCOUNTER — Emergency Department

## 2024-01-21 ENCOUNTER — Ambulatory Visit: Payer: Self-pay

## 2024-01-21 ENCOUNTER — Encounter: Payer: Self-pay | Admitting: Emergency Medicine

## 2024-01-21 DIAGNOSIS — J101 Influenza due to other identified influenza virus with other respiratory manifestations: Secondary | ICD-10-CM | POA: Diagnosis present

## 2024-01-21 DIAGNOSIS — F0390 Unspecified dementia without behavioral disturbance: Secondary | ICD-10-CM | POA: Diagnosis not present

## 2024-01-21 DIAGNOSIS — W01198A Fall on same level from slipping, tripping and stumbling with subsequent striking against other object, initial encounter: Secondary | ICD-10-CM | POA: Insufficient documentation

## 2024-01-21 DIAGNOSIS — W19XXXA Unspecified fall, initial encounter: Secondary | ICD-10-CM

## 2024-01-21 LAB — RESP PANEL BY RT-PCR (RSV, FLU A&B, COVID)  RVPGX2
Influenza A by PCR: POSITIVE — AB
Influenza B by PCR: NEGATIVE
Resp Syncytial Virus by PCR: NEGATIVE
SARS Coronavirus 2 by RT PCR: NEGATIVE

## 2024-01-21 LAB — BASIC METABOLIC PANEL WITH GFR
Anion gap: 11 (ref 5–15)
BUN: 18 mg/dL (ref 8–23)
CO2: 24 mmol/L (ref 22–32)
Calcium: 9.3 mg/dL (ref 8.9–10.3)
Chloride: 104 mmol/L (ref 98–111)
Creatinine, Ser: 0.73 mg/dL (ref 0.44–1.00)
GFR, Estimated: >60 mL/min (ref >60)
Glucose, Bld: 96 mg/dL (ref 70–99)
Potassium: 4.2 mmol/L (ref 3.5–5.1)
Sodium: 139 mmol/L (ref 135–145)

## 2024-01-21 LAB — CBC WITH DIFFERENTIAL/PLATELET
Abs Immature Granulocytes: 0.01 K/uL (ref 0.00–0.07)
Basophils Absolute: 0 K/uL (ref 0.0–0.1)
Basophils Relative: 0 %
Eosinophils Absolute: 0.1 K/uL (ref 0.0–0.5)
Eosinophils Relative: 2 %
HCT: 42.9 % (ref 36.0–46.0)
Hemoglobin: 14 g/dL (ref 12.0–15.0)
Immature Granulocytes: 0 %
Lymphocytes Relative: 24 %
Lymphs Abs: 1.2 K/uL (ref 0.7–4.0)
MCH: 30.5 pg (ref 26.0–34.0)
MCHC: 32.6 g/dL (ref 30.0–36.0)
MCV: 93.5 fL (ref 80.0–100.0)
Monocytes Absolute: 0.7 K/uL (ref 0.1–1.0)
Monocytes Relative: 14 %
Neutro Abs: 2.9 K/uL (ref 1.7–7.7)
Neutrophils Relative %: 60 %
Platelets: 159 K/uL (ref 150–400)
RBC: 4.59 MIL/uL (ref 3.87–5.11)
RDW: 13 % (ref 11.5–15.5)
WBC: 4.9 K/uL (ref 4.0–10.5)
nRBC: 0 % (ref 0.0–0.2)

## 2024-01-21 MED ORDER — GUAIFENESIN-DM 100-10 MG/5ML PO SYRP
5.0000 mL | ORAL_SOLUTION | ORAL | Status: DC | PRN
  Administered 2024-01-21: 5 mL via ORAL
  Filled 2024-01-21: qty 10

## 2024-01-21 MED ORDER — ACETAMINOPHEN 160 MG/5ML PO SOLN
500.0000 mg | Freq: Once | ORAL | Status: AC
  Administered 2024-01-21: 500 mg via ORAL
  Filled 2024-01-21: qty 20.3

## 2024-01-21 MED ORDER — OSELTAMIVIR PHOSPHATE 6 MG/ML PO SUSR: 75.0000 mg | Freq: Two times a day (BID) | ORAL | 0 refills | Status: AC

## 2024-01-21 NOTE — Telephone Encounter (Signed)
Agree with ER eval.  Thanks.  

## 2024-01-21 NOTE — ED Provider Notes (Signed)
 [facility] Provider Note    Event Date/Time   First MD Initiated Contact with Patient 01/21/24 1016     (approximate)   History   Fall   HPI  Alice Smith is a 78 y.o. female with PMH of dementia, osteporosis and arthritis. Patient had an unwitnessed fall and hit her head on the wall and put a hole in the wall. Unsure if she lost consciousness. Does not take blood thinners. History is limited as patient had dementia and is non-verbal. History obtained from patient's daughter who explained that she is making more moaning noises than normal.      Physical Exam   Triage Vital Signs: ED Triage Vitals  Encounter Vitals Group     BP 01/21/24 0949 (!) 183/122     Girls Systolic BP Percentile --      Girls Diastolic BP Percentile --      Boys Systolic BP Percentile --      Boys Diastolic BP Percentile --      Pulse Rate 01/21/24 0949 89     Resp 01/21/24 0955 19     Temp 01/21/24 0949 98.3 F (36.8 C)     Temp Source 01/21/24 0949 Oral     SpO2 01/21/24 0949 98 %     Weight 01/21/24 0949 165 lb (74.8 kg)     Height 01/21/24 0949 5' (1.524 m)     Head Circumference --      Peak Flow --      Pain Score --      Pain Loc --      Pain Education --      Exclude from Growth Chart --     Most recent vital signs: Vitals:   01/21/24 0955 01/21/24 0958  BP:  117/73  Pulse:    Resp: 19   Temp:    SpO2:     General: Awake, no distress.  CV:  Good peripheral perfusion.  RRR. Resp:  Normal effort.  CTAB. Abd:  No distention.  Other:  Does not appear to have any tenderness to palpation over the spine or paraspinal muscles, no obvious contusions, neuroexam limited due to patient's dementia but no focal neurodeficits noted   ED Results / Procedures / Treatments   Labs (all labs ordered are listed, but only abnormal results are displayed) Labs Reviewed  RESP PANEL BY RT-PCR (RSV, FLU A&B, COVID)  RVPGX2 - Abnormal; Notable for the following  components:      Result Value   Influenza A by PCR POSITIVE (*)    All other components within normal limits  BASIC METABOLIC PANEL WITH GFR  CBC WITH DIFFERENTIAL/PLATELET  URINALYSIS, ROUTINE W REFLEX MICROSCOPIC     EKG  ED provider interpretation: NSR  Vent. rate 76 BPM  PR interval 200 ms  QRS duration 72 ms  QT/QTcB 370/416 ms   RADIOLOGY  CT head and cervical spine obtained, interpreted the images as well as reviewed the radiologist report which is negative for acute abnormalities.  PROCEDURES:  Critical Care performed: No  Procedures   MEDICATIONS ORDERED IN ED: Medications  guaiFENesin-dextromethorphan (ROBITUSSIN DM) 100-10 MG/5ML syrup 5 mL (5 mLs Oral Given 01/21/24 1248)  acetaminophen  (TYLENOL ) 160 MG/5ML solution 500 mg (500 mg Oral Given 01/21/24 1248)     IMPRESSION / MDM / ASSESSMENT AND PLAN / ED COURSE  I reviewed the triage vital signs and the nursing notes.  79 year old female presents for evaluation after an unwitnessed fall.  Vital signs are stable patient NAD on exam.  Differential diagnosis includes, but is not limited to, contusion, abrasion, intracranial bleed, cervical spine fracture, muscle strain, electrolyte abnormality, anemia, arrhythmia.  Patient's presentation is most consistent with acute complicated illness / injury requiring diagnostic workup.  Since patient's fall was unwitnessed I cannot be sure that it was a mechanical fall so broad workup was done.  On exam patient is well-appearing, does not seem to be in any acute distress.  Did not note any contusions, abrasions or lacerations.  Patient tested positive for the flu.  Her daughter was planning on taking her to see the doctor today for evaluation of upper respiratory symptoms.  Will treat with Tamiflu .  Also recommended OTC cold medications.  CT head and cervical spine are both negative for acute abnormalities.  EKG shows NSR.  BMP and CBC are  within normal limits and reassuring.  Was planning to obtain a urinalysis but patient is incontinent and felt that doing a straight cath would cause more trauma than good.  Suspect that if the fall was not mechanical it may have been due to weakness from the flu.  Patient's daughter states that when she gets sick sometimes her legs become weak and they give out and this is what has caused her to fall in the past.  Patient was given Tylenol  and some cough medicine while in the ED.  On reassessment she is resting comfortably in bed.  Given reassuring workup feel that she is stable for discharge.     FINAL CLINICAL IMPRESSION(S) / ED DIAGNOSES   Final diagnoses:  Fall, initial encounter  Influenza A     Rx / DC Orders   ED Discharge Orders          Ordered    oseltamivir  (TAMIFLU ) 6 MG/ML SUSR suspension  2 times daily        01/21/24 1353             Note:  This document was prepared using Dragon voice recognition software and may include unintentional dictation errors.   Cleaster Tinnie LABOR, PA-C 01/21/24 1406    Arlander Charleston, MD 01/21/24 1406

## 2024-01-21 NOTE — ED Triage Notes (Signed)
 Pt has dementia and is nonverbal. Pt is accompanied by daughter. Pt fell back this morning while getting dressed and hit the back of her head on the wall, leaving a hole in the dry wall. Daughter reports pt is moaning more than normal, but pt can't specify where her pain is. Pt is not on blood thinners. Pt did not lose consciousness. Pt was on her way to her PCP for cold/flu symptoms that started yesterday.

## 2024-01-21 NOTE — Discharge Instructions (Signed)
 Your blood work and CT scan were normal today.  You tested positive for the flu.  I have sent some antiviral medication called Tamiflu  to the pharmacy.  Please take this medication as prescribed.    You tested positive for the flu today.  This is a viral illness which will resolve on its own with time.  You do not need an antibiotic.  You can take over-the-counter cold medicine as needed to manage your symptoms.  If you are taking combination cold medicine keep in mind that this often contains Tylenol  so if you need additional medication for body aches or fever control please take Motrin or ibuprofen.  Your symptoms should resolve with time, if you have had symptoms for greater than 10 days please be evaluated by another healthcare provider as at this point it may have developed into a bacterial infection which requires a different treatment.  Return to the emergency department with worsening symptoms.

## 2024-01-21 NOTE — Telephone Encounter (Signed)
 FYI Only or Action Required?: FYI only for provider: ED advised.  Patient was last seen in primary care on 08/27/2023 by Cleatus Arlyss RAMAN, MD.  Called Nurse Triage reporting Fall.  Symptoms began today.  Interventions attempted: Nothing.  Symptoms are: unchanged.  Triage Disposition: Go to ED Now (Notify PCP)  Patient/caregiver understands and will follow disposition?: Yes   Copied from CRM [id number]. Topic: Clinical - Red Word Triage >> Jan 21, 2024  8:10 AM Donna BRAVO wrote: Red Word that prompted transfer to Nurse Triage:   patient has dementia -does not talk -fell recently 01/21/24  -hit  back of head -may have flu -coughing a ton -not feeling well Reason for Disposition  Injury (or injuries) that need emergency care  Answer Assessment - Initial Assessment Questions Dementia, non verbal ER advised, Daughter Calleen will drive her to the hospital   1. MECHANISM: How did the fall happen?     Holding counter in bathroom while getting changed when spouse left bathroom she fell and hit head on wall. Sheet rock is dented from the fall.  2. DOMESTIC VIOLENCE AND ELDER ABUSE SCREENING: Did you fall because someone pushed you or tried to hurt you? If Yes, ask: Are you safe now?      3. ONSET: When did the fall happen? (e.g., minutes, hours, or days ago)     This morning 4. LOCATION: What part of the body hit the ground? (e.g., back, buttocks, head, hips, knees, hands, head, stomach)     Back, head 5. INJURY: Did you hurt (injure) yourself when you fell? If Yes, ask: What did you injure? Tell me more about this? (e.g., body area; type of injury; pain severity)     Goose egg 6. PAIN: Is there any pain? If Yes, ask: How bad is the pain? (e.g., Scale 0-10; or none, mild,      Non verbal 7. SIZE: For cuts, bruises, or swelling, ask: How large is it? (e.g., inches or centimeters)      Swelling, goose egg 8. PREGNANCY: Is there any chance you are pregnant? When  was your last menstrual period?      9. OTHER SYMPTOMS: Do you have any other symptoms? (e.g., dizziness, fever, weakness; new-onset or worsening).      Cough  10. CAUSE: What do you think caused the fall (or falling)? (e.g., dizzy spell, tripped)       unsure  Protocols used: Falls and [facility]

## 2024-04-30 ENCOUNTER — Ambulatory Visit

## 2024-05-06 ENCOUNTER — Ambulatory Visit

## 2024-06-08 ENCOUNTER — Ambulatory Visit: Admitting: Family Medicine

## 2024-07-14 ENCOUNTER — Other Ambulatory Visit

## 2024-07-20 ENCOUNTER — Encounter: Admitting: Family Medicine
